# Patient Record
Sex: Female | Born: 1980 | Race: Black or African American | Hispanic: No | Smoking: Never smoker
Health system: Southern US, Community
[De-identification: ages and names within clinical notes are randomized; demographics above are authoritative.]

## PROBLEM LIST (undated history)

## (undated) DIAGNOSIS — F329 Major depressive disorder, single episode, unspecified: Secondary | ICD-10-CM

## (undated) DIAGNOSIS — F419 Anxiety disorder, unspecified: Secondary | ICD-10-CM

## (undated) DIAGNOSIS — O009 Unspecified ectopic pregnancy without intrauterine pregnancy: Secondary | ICD-10-CM

## (undated) DIAGNOSIS — I10 Essential (primary) hypertension: Secondary | ICD-10-CM

## (undated) DIAGNOSIS — I471 Supraventricular tachycardia, unspecified: Secondary | ICD-10-CM

## (undated) DIAGNOSIS — E119 Type 2 diabetes mellitus without complications: Secondary | ICD-10-CM

## (undated) DIAGNOSIS — I456 Pre-excitation syndrome: Secondary | ICD-10-CM

## (undated) DIAGNOSIS — F32A Depression, unspecified: Secondary | ICD-10-CM

## (undated) DIAGNOSIS — K589 Irritable bowel syndrome without diarrhea: Secondary | ICD-10-CM

## (undated) HISTORY — PX: ABDOMINAL SURGERY: SHX537

## (undated) HISTORY — PX: ENDOMETRIAL ABLATION: SHX621

## (undated) HISTORY — PX: CARDIAC ELECTROPHYSIOLOGY MAPPING AND ABLATION: SHX1292

## (undated) HISTORY — PX: OTHER SURGICAL HISTORY: SHX169

## (undated) HISTORY — PX: CHOLECYSTECTOMY: SHX55

---

## 2012-10-04 ENCOUNTER — Emergency Department: Payer: Self-pay | Admitting: Emergency Medicine

## 2012-10-04 LAB — HCG, QUANTITATIVE, PREGNANCY: Beta Hcg, Quant.: <1 m[IU]/mL — ABNORMAL LOW

## 2012-10-04 LAB — URINALYSIS, COMPLETE
Glucose,UR: NEGATIVE mg/dL (ref 0–75)
Ketone: NEGATIVE
Leukocyte Esterase: NEGATIVE
Nitrite: POSITIVE
Ph: 6 (ref 4.5–8.0)
Protein: NEGATIVE
RBC,UR: 1 /HPF (ref 0–5)
WBC UR: 2 /HPF (ref 0–5)

## 2012-10-04 LAB — COMPREHENSIVE METABOLIC PANEL
Albumin: 4.3 g/dL (ref 3.4–5.0)
Alkaline Phosphatase: 85 U/L (ref 50–136)
Anion Gap: 4 — ABNORMAL LOW (ref 7–16)
Calcium, Total: 8.9 mg/dL (ref 8.5–10.1)
Chloride: 103 mmol/L (ref 98–107)
Co2: 30 mmol/L (ref 21–32)
Glucose: 88 mg/dL (ref 65–99)
Osmolality: 275 (ref 275–301)
Potassium: 3.7 mmol/L (ref 3.5–5.1)
SGPT (ALT): 34 U/L (ref 12–78)
Sodium: 137 mmol/L (ref 136–145)
Total Protein: 8.1 g/dL (ref 6.4–8.2)

## 2012-10-04 LAB — CBC
HCT: 42.6 % (ref 35.0–47.0)
MCH: 31.5 pg (ref 26.0–34.0)
MCV: 92 fL (ref 80–100)
Platelet: 206 10*3/uL (ref 150–440)
RBC: 4.64 10*6/uL (ref 3.80–5.20)

## 2012-10-05 LAB — WET PREP, GENITAL

## 2012-10-07 LAB — URINE CULTURE

## 2012-10-23 ENCOUNTER — Ambulatory Visit: Payer: Self-pay | Admitting: Family Medicine

## 2013-02-25 ENCOUNTER — Emergency Department: Payer: Self-pay | Admitting: Emergency Medicine

## 2013-02-25 LAB — CBC
HCT: 40 % (ref 35.0–47.0)
HGB: 13.6 g/dL (ref 12.0–16.0)
MCH: 30.1 pg (ref 26.0–34.0)
MCHC: 33.9 g/dL (ref 32.0–36.0)
MCV: 89 fL (ref 80–100)
Platelet: 221 10*3/uL (ref 150–440)
RBC: 4.51 10*6/uL (ref 3.80–5.20)

## 2013-02-25 LAB — TROPONIN I: Troponin-I: <0.02 ng/mL

## 2013-02-25 LAB — COMPREHENSIVE METABOLIC PANEL
Alkaline Phosphatase: 63 U/L (ref 50–136)
Calcium, Total: 9 mg/dL (ref 8.5–10.1)
Chloride: 102 mmol/L (ref 98–107)
Co2: 29 mmol/L (ref 21–32)
Creatinine: 0.96 mg/dL (ref 0.60–1.30)
EGFR (Non-African Amer.): >60
Glucose: 163 mg/dL — ABNORMAL HIGH (ref 65–99)
Potassium: 3.8 mmol/L (ref 3.5–5.1)
SGOT(AST): 30 U/L (ref 15–37)
SGPT (ALT): 39 U/L (ref 12–78)
Total Protein: 7.4 g/dL (ref 6.4–8.2)

## 2013-02-25 LAB — CK TOTAL AND CKMB (NOT AT ARMC): CK-MB: 1.3 ng/mL (ref 0.5–3.6)

## 2013-02-26 LAB — CK TOTAL AND CKMB (NOT AT ARMC): CK-MB: 1.1 ng/mL (ref 0.5–3.6)

## 2013-02-26 LAB — TROPONIN I: Troponin-I: <0.02 ng/mL

## 2013-06-01 ENCOUNTER — Emergency Department: Payer: Self-pay | Admitting: Emergency Medicine

## 2013-06-01 LAB — CBC
MCH: 30.2 pg (ref 26.0–34.0)
MCHC: 34.5 g/dL (ref 32.0–36.0)
MCV: 88 fL (ref 80–100)
Platelet: 191 10*3/uL (ref 150–440)
RDW: 12.8 % (ref 11.5–14.5)
WBC: 6.6 10*3/uL (ref 3.6–11.0)

## 2013-06-01 LAB — URINALYSIS, COMPLETE
Glucose,UR: >=500 mg/dL (ref 0–75)
Leukocyte Esterase: NEGATIVE
Ph: 5 (ref 4.5–8.0)
RBC,UR: 1 /HPF (ref 0–5)
WBC UR: 1 /HPF (ref 0–5)

## 2013-06-01 LAB — COMPREHENSIVE METABOLIC PANEL
Alkaline Phosphatase: 86 U/L (ref 50–136)
BUN: 13 mg/dL (ref 7–18)
Chloride: 101 mmol/L (ref 98–107)
Co2: 25 mmol/L (ref 21–32)
Creatinine: 0.73 mg/dL (ref 0.60–1.30)
EGFR (African American): >60
EGFR (Non-African Amer.): >60
Osmolality: 282 (ref 275–301)
SGOT(AST): 24 U/L (ref 15–37)

## 2013-06-01 LAB — BETA-HYDROXYBUTYRIC ACID: Beta-Hydroxybutyrate: 11.3 mg/dL — ABNORMAL HIGH (ref 0.2–2.8)

## 2013-06-02 ENCOUNTER — Emergency Department: Payer: Self-pay | Admitting: Emergency Medicine

## 2013-06-02 LAB — BASIC METABOLIC PANEL
Co2: 24 mmol/L (ref 21–32)
EGFR (African American): >60
EGFR (Non-African Amer.): >60
Glucose: 350 mg/dL — ABNORMAL HIGH (ref 65–99)
Potassium: 3.9 mmol/L (ref 3.5–5.1)
Sodium: 131 mmol/L — ABNORMAL LOW (ref 136–145)

## 2013-06-02 LAB — CBC
HGB: 15.1 g/dL (ref 12.0–16.0)
MCH: 30.5 pg (ref 26.0–34.0)
MCHC: 34.7 g/dL (ref 32.0–36.0)
RBC: 4.95 10*6/uL (ref 3.80–5.20)
RDW: 13 % (ref 11.5–14.5)
WBC: 9.3 10*3/uL (ref 3.6–11.0)

## 2013-06-03 LAB — TROPONIN I: Troponin-I: <0.02 ng/mL

## 2013-07-26 ENCOUNTER — Emergency Department: Payer: Self-pay | Admitting: Emergency Medicine

## 2013-07-26 LAB — URINALYSIS, COMPLETE
Bacteria: NONE SEEN
Bilirubin,UR: NEGATIVE
Glucose,UR: NEGATIVE mg/dL (ref 0–75)
Ketone: NEGATIVE
Leukocyte Esterase: NEGATIVE
Ph: 8 (ref 4.5–8.0)
RBC,UR: 31 /HPF (ref 0–5)
Specific Gravity: 1.006 (ref 1.003–1.030)
Squamous Epithelial: 2
WBC UR: 2 /HPF (ref 0–5)

## 2013-07-26 LAB — COMPREHENSIVE METABOLIC PANEL
Albumin: 3.7 g/dL (ref 3.4–5.0)
Alkaline Phosphatase: 80 U/L (ref 50–136)
Anion Gap: 3 — ABNORMAL LOW (ref 7–16)
BUN: 11 mg/dL (ref 7–18)
Bilirubin,Total: 0.5 mg/dL (ref 0.2–1.0)
Calcium, Total: 8.9 mg/dL (ref 8.5–10.1)
Chloride: 104 mmol/L (ref 98–107)
Co2: 28 mmol/L (ref 21–32)
Creatinine: 0.87 mg/dL (ref 0.60–1.30)
EGFR (African American): >60
EGFR (Non-African Amer.): >60
Glucose: 140 mg/dL — ABNORMAL HIGH (ref 65–99)
Potassium: 3.9 mmol/L (ref 3.5–5.1)
SGOT(AST): 20 U/L (ref 15–37)
SGPT (ALT): 23 U/L (ref 12–78)
Total Protein: 7.2 g/dL (ref 6.4–8.2)

## 2013-07-26 LAB — CBC
HGB: 12.5 g/dL (ref 12.0–16.0)
MCH: 30.3 pg (ref 26.0–34.0)
MCV: 88 fL (ref 80–100)
Platelet: 263 10*3/uL (ref 150–440)
RBC: 4.13 10*6/uL (ref 3.80–5.20)
RDW: 12.3 % (ref 11.5–14.5)

## 2013-08-13 ENCOUNTER — Emergency Department: Payer: Self-pay | Admitting: Emergency Medicine

## 2013-08-15 LAB — BETA STREP CULTURE(ARMC)

## 2013-10-09 ENCOUNTER — Emergency Department: Payer: Self-pay | Admitting: Emergency Medicine

## 2013-10-09 LAB — BASIC METABOLIC PANEL
Anion Gap: 5 — ABNORMAL LOW (ref 7–16)
BUN: 14 mg/dL (ref 7–18)
Calcium, Total: 9.4 mg/dL (ref 8.5–10.1)
Chloride: 102 mmol/L (ref 98–107)
Co2: 30 mmol/L (ref 21–32)
EGFR (African American): >60

## 2013-10-09 LAB — CBC
HCT: 39.2 % (ref 35.0–47.0)
HGB: 12.9 g/dL (ref 12.0–16.0)
MCH: 28.8 pg (ref 26.0–34.0)
MCHC: 33.1 g/dL (ref 32.0–36.0)
MCV: 87 fL (ref 80–100)
RDW: 14.3 % (ref 11.5–14.5)

## 2013-10-09 LAB — PRO B NATRIURETIC PEPTIDE: B-Type Natriuretic Peptide: 37 pg/mL (ref 0–125)

## 2013-11-07 ENCOUNTER — Emergency Department: Payer: Self-pay | Admitting: Emergency Medicine

## 2013-11-07 LAB — COMPREHENSIVE METABOLIC PANEL
AST: 26 U/L (ref 15–37)
Albumin: 4.3 g/dL (ref 3.4–5.0)
Alkaline Phosphatase: 85 U/L
Anion Gap: 3 — ABNORMAL LOW (ref 7–16)
BILIRUBIN TOTAL: 0.5 mg/dL (ref 0.2–1.0)
BUN: 12 mg/dL (ref 7–18)
CHLORIDE: 97 mmol/L — AB (ref 98–107)
Calcium, Total: 9.9 mg/dL (ref 8.5–10.1)
Co2: 31 mmol/L (ref 21–32)
Creatinine: 1.04 mg/dL (ref 0.60–1.30)
EGFR (African American): >60
EGFR (Non-African Amer.): >60
GLUCOSE: 311 mg/dL — AB (ref 65–99)
Osmolality: 274 (ref 275–301)
POTASSIUM: 3.8 mmol/L (ref 3.5–5.1)
SGPT (ALT): 31 U/L (ref 12–78)
Sodium: 131 mmol/L — ABNORMAL LOW (ref 136–145)
TOTAL PROTEIN: 8.5 g/dL — AB (ref 6.4–8.2)

## 2013-11-07 LAB — URINALYSIS, COMPLETE
Bacteria: NONE SEEN
Bilirubin,UR: NEGATIVE
Blood: NEGATIVE
KETONE: NEGATIVE
LEUKOCYTE ESTERASE: NEGATIVE
Nitrite: NEGATIVE
PH: 6 (ref 4.5–8.0)
Protein: NEGATIVE
RBC,UR: 1 /HPF (ref 0–5)
Specific Gravity: 1.031 (ref 1.003–1.030)

## 2013-11-07 LAB — CBC
HCT: 40.9 % (ref 35.0–47.0)
HGB: 13.7 g/dL (ref 12.0–16.0)
MCH: 28.8 pg (ref 26.0–34.0)
MCHC: 33.4 g/dL (ref 32.0–36.0)
MCV: 86 fL (ref 80–100)
PLATELETS: 231 10*3/uL (ref 150–440)
RBC: 4.75 10*6/uL (ref 3.80–5.20)
RDW: 14.1 % (ref 11.5–14.5)
WBC: 6.7 10*3/uL (ref 3.6–11.0)

## 2013-11-19 ENCOUNTER — Emergency Department: Payer: Self-pay | Admitting: Emergency Medicine

## 2014-05-27 ENCOUNTER — Emergency Department: Payer: Self-pay | Admitting: Emergency Medicine

## 2014-05-27 LAB — COMPREHENSIVE METABOLIC PANEL
ANION GAP: 8 (ref 7–16)
AST: 30 U/L (ref 15–37)
Albumin: 4 g/dL (ref 3.4–5.0)
Alkaline Phosphatase: 56 U/L
BUN: 13 mg/dL (ref 7–18)
Bilirubin,Total: 0.5 mg/dL (ref 0.2–1.0)
Calcium, Total: 9.2 mg/dL (ref 8.5–10.1)
Chloride: 101 mmol/L (ref 98–107)
Co2: 27 mmol/L (ref 21–32)
Creatinine: 0.84 mg/dL (ref 0.60–1.30)
GLUCOSE: 127 mg/dL — AB (ref 65–99)
OSMOLALITY: 274 (ref 275–301)
Potassium: 3.4 mmol/L — ABNORMAL LOW (ref 3.5–5.1)
SGPT (ALT): 28 U/L
Sodium: 136 mmol/L (ref 136–145)
TOTAL PROTEIN: 7.7 g/dL (ref 6.4–8.2)

## 2014-05-27 LAB — URINALYSIS, COMPLETE
Bacteria: NONE SEEN
Bilirubin,UR: NEGATIVE
Blood: NEGATIVE
Glucose,UR: NEGATIVE mg/dL (ref 0–75)
KETONE: NEGATIVE
Leukocyte Esterase: NEGATIVE
Nitrite: NEGATIVE
Ph: 5 (ref 4.5–8.0)
Protein: NEGATIVE
SPECIFIC GRAVITY: 1.011 (ref 1.003–1.030)
WBC UR: 2 /HPF (ref 0–5)

## 2014-05-27 LAB — CBC
HCT: 38.3 % (ref 35.0–47.0)
HGB: 12.6 g/dL (ref 12.0–16.0)
MCH: 29 pg (ref 26.0–34.0)
MCHC: 32.8 g/dL (ref 32.0–36.0)
MCV: 88 fL (ref 80–100)
Platelet: 246 10*3/uL (ref 150–440)
RBC: 4.34 10*6/uL (ref 3.80–5.20)
RDW: 13.8 % (ref 11.5–14.5)
WBC: 8.1 10*3/uL (ref 3.6–11.0)

## 2014-07-25 ENCOUNTER — Emergency Department: Payer: Self-pay | Admitting: Emergency Medicine

## 2014-07-25 LAB — CBC WITH DIFFERENTIAL/PLATELET
Basophil #: 0.1 10*3/uL (ref 0.0–0.1)
Basophil %: 0.5 %
Eosinophil #: 0.1 10*3/uL (ref 0.0–0.7)
Eosinophil %: 1.2 %
HCT: 41.5 % (ref 35.0–47.0)
HGB: 13.4 g/dL (ref 12.0–16.0)
LYMPHS PCT: 13.8 %
Lymphocyte #: 1.5 10*3/uL (ref 1.0–3.6)
MCH: 28.9 pg (ref 26.0–34.0)
MCHC: 32.4 g/dL (ref 32.0–36.0)
MCV: 89 fL (ref 80–100)
MONO ABS: 0.8 x10 3/mm (ref 0.2–0.9)
MONOS PCT: 7 %
NEUTROS PCT: 77.5 %
Neutrophil #: 8.6 10*3/uL — ABNORMAL HIGH (ref 1.4–6.5)
Platelet: 242 10*3/uL (ref 150–440)
RBC: 4.65 10*6/uL (ref 3.80–5.20)
RDW: 14.8 % — ABNORMAL HIGH (ref 11.5–14.5)
WBC: 11.1 10*3/uL — ABNORMAL HIGH (ref 3.6–11.0)

## 2014-07-25 LAB — WET PREP, GENITAL

## 2014-07-25 LAB — URINALYSIS, COMPLETE
BACTERIA: NONE SEEN
Bilirubin,UR: NEGATIVE
Nitrite: NEGATIVE
Ph: 6 (ref 4.5–8.0)
SPECIFIC GRAVITY: 1.031 (ref 1.003–1.030)
Squamous Epithelial: 2
WBC UR: 261 /HPF (ref 0–5)

## 2014-07-25 LAB — TROPONIN I

## 2014-07-25 LAB — COMPREHENSIVE METABOLIC PANEL
ALBUMIN: 3.9 g/dL (ref 3.4–5.0)
ALK PHOS: 68 U/L
AST: 25 U/L (ref 15–37)
Anion Gap: 5 — ABNORMAL LOW (ref 7–16)
BILIRUBIN TOTAL: 1 mg/dL (ref 0.2–1.0)
BUN: 18 mg/dL (ref 7–18)
CHLORIDE: 100 mmol/L (ref 98–107)
Calcium, Total: 8.5 mg/dL (ref 8.5–10.1)
Co2: 28 mmol/L (ref 21–32)
Creatinine: 1.21 mg/dL (ref 0.60–1.30)
EGFR (African American): >60
EGFR (Non-African Amer.): 55 — ABNORMAL LOW
Glucose: 360 mg/dL — ABNORMAL HIGH (ref 65–99)
Osmolality: 283 (ref 275–301)
Potassium: 3.8 mmol/L (ref 3.5–5.1)
SGPT (ALT): 24 U/L
Sodium: 133 mmol/L — ABNORMAL LOW (ref 136–145)
TOTAL PROTEIN: 7.5 g/dL (ref 6.4–8.2)

## 2014-07-25 LAB — GC/CHLAMYDIA PROBE AMP

## 2014-07-28 LAB — URINE CULTURE

## 2014-08-07 ENCOUNTER — Emergency Department: Payer: Self-pay | Admitting: Emergency Medicine

## 2014-08-07 LAB — COMPREHENSIVE METABOLIC PANEL
ALBUMIN: 3.5 g/dL (ref 3.4–5.0)
ANION GAP: 11 (ref 7–16)
Alkaline Phosphatase: 76 U/L
BILIRUBIN TOTAL: 0.5 mg/dL (ref 0.2–1.0)
BUN: 11 mg/dL (ref 7–18)
CALCIUM: 8.9 mg/dL (ref 8.5–10.1)
CO2: 23 mmol/L (ref 21–32)
Chloride: 104 mmol/L (ref 98–107)
Creatinine: 0.87 mg/dL (ref 0.60–1.30)
EGFR (African American): >60
EGFR (Non-African Amer.): >60
Glucose: 190 mg/dL — ABNORMAL HIGH (ref 65–99)
Osmolality: 280 (ref 275–301)
Potassium: 3.6 mmol/L (ref 3.5–5.1)
SGOT(AST): 21 U/L (ref 15–37)
SGPT (ALT): 21 U/L
Sodium: 138 mmol/L (ref 136–145)
TOTAL PROTEIN: 7.4 g/dL (ref 6.4–8.2)

## 2014-08-07 LAB — CBC
HCT: 39.4 % (ref 35.0–47.0)
HGB: 12.7 g/dL (ref 12.0–16.0)
MCH: 28.3 pg (ref 26.0–34.0)
MCHC: 32.2 g/dL (ref 32.0–36.0)
MCV: 88 fL (ref 80–100)
Platelet: 239 10*3/uL (ref 150–440)
RBC: 4.48 10*6/uL (ref 3.80–5.20)
RDW: 14.4 % (ref 11.5–14.5)
WBC: 6.4 10*3/uL (ref 3.6–11.0)

## 2014-08-07 LAB — CK TOTAL AND CKMB (NOT AT ARMC)
CK, TOTAL: 101 U/L
CK-MB: 0.5 ng/mL (ref 0.5–3.6)

## 2014-08-07 LAB — TROPONIN I: Troponin-I: <0.02 ng/mL

## 2014-09-04 ENCOUNTER — Emergency Department: Payer: Self-pay | Admitting: Emergency Medicine

## 2014-09-22 ENCOUNTER — Emergency Department: Payer: Self-pay | Admitting: Emergency Medicine

## 2014-09-22 LAB — COMPREHENSIVE METABOLIC PANEL
AST: 20 U/L (ref 15–37)
Albumin: 3.6 g/dL (ref 3.4–5.0)
Alkaline Phosphatase: 61 U/L
Anion Gap: 7 (ref 7–16)
BILIRUBIN TOTAL: 0.4 mg/dL (ref 0.2–1.0)
BUN: 19 mg/dL — ABNORMAL HIGH (ref 7–18)
CALCIUM: 8.8 mg/dL (ref 8.5–10.1)
CHLORIDE: 105 mmol/L (ref 98–107)
CREATININE: 0.81 mg/dL (ref 0.60–1.30)
Co2: 27 mmol/L (ref 21–32)
EGFR (African American): >60
EGFR (Non-African Amer.): >60
Glucose: 90 mg/dL (ref 65–99)
Osmolality: 279 (ref 275–301)
Potassium: 4.2 mmol/L (ref 3.5–5.1)
SGPT (ALT): 20 U/L
Sodium: 139 mmol/L (ref 136–145)
TOTAL PROTEIN: 6.6 g/dL (ref 6.4–8.2)

## 2014-09-22 LAB — CBC
HCT: 39.1 % (ref 35.0–47.0)
HGB: 12.8 g/dL (ref 12.0–16.0)
MCH: 30 pg (ref 26.0–34.0)
MCHC: 32.8 g/dL (ref 32.0–36.0)
MCV: 92 fL (ref 80–100)
Platelet: 219 10*3/uL (ref 150–440)
RBC: 4.27 10*6/uL (ref 3.80–5.20)
RDW: 14.5 % (ref 11.5–14.5)
WBC: 7.3 10*3/uL (ref 3.6–11.0)

## 2014-09-22 LAB — TROPONIN I: Troponin-I: <0.02 ng/mL

## 2014-10-02 ENCOUNTER — Emergency Department: Payer: Self-pay | Admitting: Emergency Medicine

## 2014-10-02 LAB — URINALYSIS, COMPLETE
Bacteria: NONE SEEN
Bilirubin,UR: NEGATIVE
Blood: NEGATIVE
Glucose,UR: 50 mg/dL (ref 0–75)
Ketone: NEGATIVE
Leukocyte Esterase: NEGATIVE
Nitrite: NEGATIVE
Ph: 6 (ref 4.5–8.0)
Protein: NEGATIVE
RBC,UR: 2 /HPF (ref 0–5)
Specific Gravity: 1.017 (ref 1.003–1.030)
Squamous Epithelial: 8
WBC UR: 1 /HPF (ref 0–5)

## 2014-10-02 LAB — CBC
HCT: 40 % (ref 35.0–47.0)
HGB: 13.2 g/dL (ref 12.0–16.0)
MCH: 30.1 pg (ref 26.0–34.0)
MCHC: 33.1 g/dL (ref 32.0–36.0)
MCV: 91 fL (ref 80–100)
Platelet: 238 10*3/uL (ref 150–440)
RBC: 4.39 10*6/uL (ref 3.80–5.20)
RDW: 14.5 % (ref 11.5–14.5)
WBC: 8.9 10*3/uL (ref 3.6–11.0)

## 2014-10-02 LAB — COMPREHENSIVE METABOLIC PANEL
AST: 24 U/L (ref 15–37)
Albumin: 4.1 g/dL (ref 3.4–5.0)
Alkaline Phosphatase: 66 U/L
Anion Gap: 8 (ref 7–16)
BUN: 22 mg/dL — AB (ref 7–18)
Bilirubin,Total: 0.4 mg/dL (ref 0.2–1.0)
Calcium, Total: 9.1 mg/dL (ref 8.5–10.1)
Chloride: 103 mmol/L (ref 98–107)
Co2: 29 mmol/L (ref 21–32)
Creatinine: 0.84 mg/dL (ref 0.60–1.30)
EGFR (African American): >60
EGFR (Non-African Amer.): >60
GLUCOSE: 65 mg/dL (ref 65–99)
Osmolality: 281 (ref 275–301)
Potassium: 3.5 mmol/L (ref 3.5–5.1)
SGPT (ALT): 24 U/L
Sodium: 140 mmol/L (ref 136–145)
TOTAL PROTEIN: 7.6 g/dL (ref 6.4–8.2)

## 2014-10-02 LAB — TROPONIN I: Troponin-I: <0.02 ng/mL

## 2014-10-30 ENCOUNTER — Emergency Department: Payer: Self-pay | Admitting: Emergency Medicine

## 2014-10-30 LAB — URINALYSIS, COMPLETE
BACTERIA: NONE SEEN
BLOOD: NEGATIVE
Bilirubin,UR: NEGATIVE
Ketone: NEGATIVE
Leukocyte Esterase: NEGATIVE
Nitrite: NEGATIVE
PH: 5 (ref 4.5–8.0)
Protein: NEGATIVE
RBC,UR: 1 /HPF (ref 0–5)
Specific Gravity: 1.021 (ref 1.003–1.030)
Squamous Epithelial: 1
WBC UR: 2 /HPF (ref 0–5)

## 2014-10-30 LAB — CBC
HCT: 39.8 % (ref 35.0–47.0)
HGB: 13 g/dL (ref 12.0–16.0)
MCH: 30.2 pg (ref 26.0–34.0)
MCHC: 32.6 g/dL (ref 32.0–36.0)
MCV: 93 fL (ref 80–100)
PLATELETS: 231 10*3/uL (ref 150–440)
RBC: 4.3 10*6/uL (ref 3.80–5.20)
RDW: 14.3 % (ref 11.5–14.5)
WBC: 8.4 10*3/uL (ref 3.6–11.0)

## 2014-10-30 LAB — COMPREHENSIVE METABOLIC PANEL
ALK PHOS: 61 U/L
ALT: 24 U/L
AST: 19 U/L (ref 15–37)
Albumin: 3.9 g/dL (ref 3.4–5.0)
Anion Gap: 7 (ref 7–16)
BUN: 14 mg/dL (ref 7–18)
Bilirubin,Total: 0.4 mg/dL (ref 0.2–1.0)
CO2: 25 mmol/L (ref 21–32)
Calcium, Total: 8.7 mg/dL (ref 8.5–10.1)
Chloride: 104 mmol/L (ref 98–107)
Creatinine: 0.9 mg/dL (ref 0.60–1.30)
EGFR (Non-African Amer.): >60
Glucose: 183 mg/dL — ABNORMAL HIGH (ref 65–99)
Osmolality: 277 (ref 275–301)
Potassium: 3.8 mmol/L (ref 3.5–5.1)
Sodium: 136 mmol/L (ref 136–145)
TOTAL PROTEIN: 7.5 g/dL (ref 6.4–8.2)

## 2014-10-30 LAB — WET PREP, GENITAL

## 2014-10-30 LAB — GC/CHLAMYDIA PROBE AMP

## 2014-10-30 LAB — PREGNANCY, URINE: Pregnancy Test, Urine: NEGATIVE m[IU]/mL

## 2014-11-16 ENCOUNTER — Emergency Department: Payer: Self-pay | Admitting: Emergency Medicine

## 2014-11-16 LAB — COMPREHENSIVE METABOLIC PANEL
ALK PHOS: 63 U/L
ALT: 24 U/L
AST: 30 U/L (ref 15–37)
Albumin: 3.9 g/dL (ref 3.4–5.0)
Anion Gap: 9 (ref 7–16)
BILIRUBIN TOTAL: 0.7 mg/dL (ref 0.2–1.0)
BUN: 13 mg/dL (ref 7–18)
CHLORIDE: 101 mmol/L (ref 98–107)
CREATININE: 1.04 mg/dL (ref 0.60–1.30)
Calcium, Total: 9 mg/dL (ref 8.5–10.1)
Co2: 27 mmol/L (ref 21–32)
Glucose: 339 mg/dL — ABNORMAL HIGH (ref 65–99)
Osmolality: 287 (ref 275–301)
Potassium: 3.6 mmol/L (ref 3.5–5.1)
Sodium: 137 mmol/L (ref 136–145)
Total Protein: 7.4 g/dL (ref 6.4–8.2)

## 2014-11-16 LAB — CBC WITH DIFFERENTIAL/PLATELET
Basophil #: 0.1 10*3/uL (ref 0.0–0.1)
Basophil %: 0.7 %
EOS ABS: 0.1 10*3/uL (ref 0.0–0.7)
Eosinophil %: 1.2 %
HCT: 39.1 % (ref 35.0–47.0)
HGB: 12.6 g/dL (ref 12.0–16.0)
Lymphocyte #: 2.5 10*3/uL (ref 1.0–3.6)
Lymphocyte %: 27.9 %
MCH: 29.2 pg (ref 26.0–34.0)
MCHC: 32.3 g/dL (ref 32.0–36.0)
MCV: 90 fL (ref 80–100)
MONO ABS: 0.7 x10 3/mm (ref 0.2–0.9)
Monocyte %: 7.4 %
NEUTROS PCT: 62.8 %
Neutrophil #: 5.5 10*3/uL (ref 1.4–6.5)
PLATELETS: 233 10*3/uL (ref 150–440)
RBC: 4.33 10*6/uL (ref 3.80–5.20)
RDW: 13.8 % (ref 11.5–14.5)
WBC: 8.8 10*3/uL (ref 3.6–11.0)

## 2014-11-16 LAB — LIPASE, BLOOD: LIPASE: 101 U/L (ref 73–393)

## 2014-11-17 LAB — URINALYSIS, COMPLETE
BACTERIA: NONE SEEN
BLOOD: NEGATIVE
Bilirubin,UR: NEGATIVE
LEUKOCYTE ESTERASE: NEGATIVE
NITRITE: NEGATIVE
Ph: 7 (ref 4.5–8.0)
Protein: NEGATIVE
RBC,UR: <1 /HPF (ref 0–5)
SPECIFIC GRAVITY: 1.018 (ref 1.003–1.030)
Squamous Epithelial: 1

## 2014-11-25 ENCOUNTER — Ambulatory Visit: Payer: Self-pay | Admitting: Physician Assistant

## 2015-01-20 ENCOUNTER — Ambulatory Visit: Payer: Self-pay | Admitting: Family Medicine

## 2015-01-22 LAB — CBC WITH DIFFERENTIAL/PLATELET
Basophil #: 0 10*3/uL (ref 0.0–0.1)
Basophil %: 0.5 %
Eosinophil #: 0.1 10*3/uL (ref 0.0–0.7)
Eosinophil %: 1.1 %
HCT: 38.5 % (ref 35.0–47.0)
HGB: 12.7 g/dL (ref 12.0–16.0)
Lymphocyte #: 1.7 10*3/uL (ref 1.0–3.6)
Lymphocyte %: 21.5 %
MCH: 29.4 pg (ref 26.0–34.0)
MCHC: 32.9 g/dL (ref 32.0–36.0)
MCV: 89 fL (ref 80–100)
Monocyte #: 0.7 x10 3/mm (ref 0.2–0.9)
Monocyte %: 8.7 %
NEUTROS ABS: 5.4 10*3/uL (ref 1.4–6.5)
NEUTROS PCT: 68.2 %
PLATELETS: 246 10*3/uL (ref 150–440)
RBC: 4.31 10*6/uL (ref 3.80–5.20)
RDW: 14.7 % — ABNORMAL HIGH (ref 11.5–14.5)
WBC: 8 10*3/uL (ref 3.6–11.0)

## 2015-01-22 LAB — COMPREHENSIVE METABOLIC PANEL
Albumin: 4.2 g/dL
Alkaline Phosphatase: 55 U/L
Anion Gap: 6 — ABNORMAL LOW (ref 7–16)
BILIRUBIN TOTAL: 0.7 mg/dL
BUN: 15 mg/dL
CO2: 28 mmol/L
Calcium, Total: 9.1 mg/dL
Chloride: 97 mmol/L — ABNORMAL LOW
Creatinine: 0.75 mg/dL
EGFR (Non-African Amer.): >60
GLUCOSE: 226 mg/dL — AB
Potassium: 3.8 mmol/L
SGOT(AST): 20 U/L
SGPT (ALT): 16 U/L
Sodium: 131 mmol/L — ABNORMAL LOW
TOTAL PROTEIN: 7.4 g/dL

## 2015-03-13 ENCOUNTER — Other Ambulatory Visit: Payer: Self-pay

## 2015-03-13 ENCOUNTER — Encounter: Payer: Self-pay | Admitting: Emergency Medicine

## 2015-03-13 ENCOUNTER — Emergency Department
Admission: EM | Admit: 2015-03-13 | Discharge: 2015-03-13 | Disposition: A | Discharge status: Home / Self Care | Payer: Commercial Managed Care - HMO | Attending: Emergency Medicine | Admitting: Emergency Medicine

## 2015-03-13 DIAGNOSIS — I10 Essential (primary) hypertension: Secondary | ICD-10-CM | POA: Diagnosis not present

## 2015-03-13 DIAGNOSIS — R51 Headache: Secondary | ICD-10-CM | POA: Diagnosis not present

## 2015-03-13 DIAGNOSIS — R079 Chest pain, unspecified: Secondary | ICD-10-CM | POA: Diagnosis present

## 2015-03-13 DIAGNOSIS — M940 Chondrocostal junction syndrome [Tietze]: Secondary | ICD-10-CM | POA: Diagnosis not present

## 2015-03-13 DIAGNOSIS — Z880 Allergy status to penicillin: Secondary | ICD-10-CM | POA: Insufficient documentation

## 2015-03-13 DIAGNOSIS — E119 Type 2 diabetes mellitus without complications: Secondary | ICD-10-CM | POA: Insufficient documentation

## 2015-03-13 HISTORY — DX: Essential (primary) hypertension: I10

## 2015-03-13 HISTORY — DX: Type 2 diabetes mellitus without complications: E11.9

## 2015-03-13 LAB — COMPREHENSIVE METABOLIC PANEL
ALT: 20 U/L (ref 14–54)
ANION GAP: 9 (ref 5–15)
AST: 25 U/L (ref 15–41)
Albumin: 4.4 g/dL (ref 3.5–5.0)
Alkaline Phosphatase: 54 U/L (ref 38–126)
BILIRUBIN TOTAL: 0.7 mg/dL (ref 0.3–1.2)
BUN: 20 mg/dL (ref 6–20)
CHLORIDE: 102 mmol/L (ref 101–111)
CO2: 28 mmol/L (ref 22–32)
Calcium: 9.8 mg/dL (ref 8.9–10.3)
Creatinine, Ser: 0.85 mg/dL (ref 0.44–1.00)
GFR calc Af Amer: >60 mL/min (ref >60)
GFR calc non Af Amer: >60 mL/min (ref >60)
Glucose, Bld: 173 mg/dL — ABNORMAL HIGH (ref 65–99)
POTASSIUM: 3.5 mmol/L (ref 3.5–5.1)
Sodium: 139 mmol/L (ref 135–145)
Total Protein: 7.5 g/dL (ref 6.5–8.1)

## 2015-03-13 LAB — CBC
HEMATOCRIT: 39 % (ref 35.0–47.0)
Hemoglobin: 12.4 g/dL (ref 12.0–16.0)
MCH: 28.6 pg (ref 26.0–34.0)
MCHC: 31.9 g/dL — ABNORMAL LOW (ref 32.0–36.0)
MCV: 89.7 fL (ref 80.0–100.0)
Platelets: 241 10*3/uL (ref 150–440)
RBC: 4.35 MIL/uL (ref 3.80–5.20)
RDW: 14.8 % — AB (ref 11.5–14.5)
WBC: 7.7 10*3/uL (ref 3.6–11.0)

## 2015-03-13 LAB — TROPONIN I

## 2015-03-13 LAB — GLUCOSE, CAPILLARY: GLUCOSE-CAPILLARY: 170 mg/dL — AB (ref 65–99)

## 2015-03-13 LAB — FIBRIN DERIVATIVES D-DIMER (ARMC ONLY): Fibrin derivatives D-dimer (ARMC): 169 (ref 0–499)

## 2015-03-13 MED ORDER — OXYCODONE-ACETAMINOPHEN 5-325 MG PO TABS: 1.0000 | ORAL_TABLET | Freq: Once | ORAL | Status: DC

## 2015-03-13 MED ORDER — KETOROLAC TROMETHAMINE 60 MG/2ML IM SOLN: 60.0000 mg | Freq: Once | INTRAMUSCULAR | Status: DC

## 2015-03-13 MED ORDER — OXYCODONE-ACETAMINOPHEN 5-325 MG PO TABS
ORAL_TABLET | ORAL | Status: AC
  Filled 2015-03-13: qty 1

## 2015-03-13 MED ORDER — KETOROLAC TROMETHAMINE 30 MG/ML IJ SOLN
INTRAMUSCULAR | Status: AC
  Administered 2015-03-13: 30 mg via INTRAVENOUS
  Filled 2015-03-13: qty 1

## 2015-03-13 MED ORDER — KETOROLAC TROMETHAMINE 30 MG/ML IJ SOLN
30.0000 mg | Freq: Once | INTRAMUSCULAR | Status: AC
  Administered 2015-03-13: 30 mg via INTRAVENOUS

## 2015-03-13 NOTE — Discharge Instructions (Signed)
Costochondritis Costochondritis, sometimes called Tietze syndrome, is a swelling and irritation (inflammation) of the tissue (cartilage) that connects your ribs with your breastbone (sternum). It causes pain in the chest and rib area. Costochondritis usually goes away on its own over time. It can take up to 6 weeks or longer to get better, especially if you are unable to limit your activities. CAUSES  Some cases of costochondritis have no known cause. Possible causes include:  Injury (trauma).  Exercise or activity such as lifting.  Severe coughing. SIGNS AND SYMPTOMS  Pain and tenderness in the chest and rib area.  Pain that gets worse when coughing or taking deep breaths.  Pain that gets worse with specific movements. DIAGNOSIS  Your health care provider will do a physical exam and ask about your symptoms. Chest X-rays or other tests may be done to rule out other problems. TREATMENT  Costochondritis usually goes away on its own over time. Your health care provider may prescribe medicine to help relieve pain. HOME CARE INSTRUCTIONS   Avoid exhausting physical activity. Try not to strain your ribs during normal activity. This would include any activities using chest, abdominal, and side muscles, especially if heavy weights are used.  Apply ice to the affected area for the first 2 days after the pain begins.  Put ice in a plastic bag.  Place a towel between your skin and the bag.  Leave the ice on for 20 minutes, 2-3 times a day.  Only take over-the-counter or prescription medicines as directed by your health care provider. SEEK MEDICAL CARE IF:  You have redness or swelling at the rib joints. These are signs of infection.  Your pain does not go away despite rest or medicine. SEEK IMMEDIATE MEDICAL CARE IF:   Your pain increases or you are very uncomfortable.  You have shortness of breath or difficulty breathing.  You cough up blood.  You have worse chest pains,  sweating, or vomiting.  You have a fever or persistent symptoms for more than 2-3 days.  You have a fever and your symptoms suddenly get worse. MAKE SURE YOU:   Understand these instructions.  Will watch your condition.  Will get help right away if you are not doing well or get worse. Document Released: 07/27/2005 Document Revised: 08/07/2013 Document Reviewed: 05/21/2013 Exit[employer] Patient Information 2015 [employer]. This information is not intended to replace advice given to you by your health care provider. Make sure you discuss any questions you have with your health care provider.  

## 2015-03-13 NOTE — ED Notes (Signed)
Pt presents to the ER from home with complaints of Chest Pain. Pt reports she was lying down, pt reports sudden chest discomfort"feel like pressure" Pt reports headache for the past 2 days. Pt talks in complete sentences no respiratory distress noted.

## 2015-03-13 NOTE — ED Provider Notes (Signed)
[facility] Emergency Department Provider Note  ____________________________________________  Time seen: 5:50 AM  I have reviewed the triage vital signs and the nursing notes.   HISTORY  Chief Complaint Chest Pain and Headache      HPI Jody Chavez is a 34 y.o. female presents with acute onset of central chest pressure that is nonradiating reproducible with palpation with onset at 4:30 AM. Patient also admits of headache 2 days she describes the migraine. Current pain score 5 out of 10     Past Medical History  Diagnosis Date  . Hypertension   . Diabetes mellitus without complication     There are no active problems to display for this patient.   Past Surgical History  Procedure Laterality Date  . Cholecystectomy    . Heart ablation      No current outpatient prescriptions on file.  Allergies Penicillins  History reviewed. No pertinent family history.  Social History History  Substance Use Topics  . Smoking status: Never Smoker   . Smokeless tobacco: Never Used  . Alcohol Use: Yes    Review of Systems  Constitutional: Negative for fever. Eyes: Negative for visual changes. ENT: Negative for sore throat. Cardiovascular: Negative for chest pain. Respiratory: Negative for shortness of breath. Gastrointestinal: Negative for abdominal pain, vomiting and diarrhea. Genitourinary: Negative for dysuria. Musculoskeletal: Negative for back pain. Skin: Negative for rash. Neurological: Negative for headaches, focal weakness or numbness.   10-point ROS otherwise negative.  ____________________________________________   PHYSICAL EXAM:  VITAL SIGNS: ED Triage Vitals  Enc Vitals Group     BP 03/13/15 0500 120/80 mmHg     Pulse Rate 03/13/15 0500 109     Resp 03/13/15 0500 20     Temp 03/13/15 0500 98 F (36.7 C)     Temp Source 03/13/15 0500 Oral     SpO2 03/13/15 0500 100 %     Weight 03/13/15 0500 175 lb (79.379 kg)      Height 03/13/15 0500 5\' 3"  (1.6 m)     Head Cir --      Peak Flow --      Pain Score 03/13/15 0504 6     Pain Loc --      Pain Edu? --      Excl. in GC? --      Constitutional: Alert and oriented. Well appearing and in no distress. Eyes: Conjunctivae are normal. PERRL. Normal extraocular movements. ENT   Head: Normocephalic and atraumatic.   Nose: No congestion/rhinnorhea.   Mouth/Throat: Mucous membranes are moist.   Neck: No stridor. Cardiovascular: Normal rate, regular rhythm. Normal and symmetric distal pulses are present in all extremities. No murmurs, rubs, or gallops. Right parasternal chest pain with palpation Respiratory: Normal respiratory effort without tachypnea nor retractions. Breath sounds are clear and equal bilaterally. No wheezes/rales/rhonchi. Gastrointestinal: Soft and nontender. No distention. There is no CVA tenderness. Genitourinary: deferred Musculoskeletal: Nontender with normal range of motion in all extremities. No joint effusions.  No lower extremity tenderness nor edema. Neurologic:  Normal speech and language. No gross focal neurologic deficits are appreciated. Speech is normal.  Skin:  Skin is warm, dry and intact. No rash noted. Psychiatric: Mood and affect are normal. Speech and behavior are normal. Patient exhibits appropriate insight and judgment.  ____________________________________________    LABS (pertinent positives/negatives)  Labs Reviewed  CBC - Abnormal; Notable for the following:    MCHC 31.9 (*)    RDW 14.8 (*)    All other  components within normal limits  COMPREHENSIVE METABOLIC PANEL - Abnormal; Notable for the following:    Glucose, Bld 173 (*)    All other components within normal limits  GLUCOSE, CAPILLARY - Abnormal; Notable for the following:    Glucose-Capillary 170 (*)    All other components within normal limits  TROPONIN I  FIBRIN DERIVATIVES D-D[facility])      ____________________________________________   EKG   Date: 03/13/2015  Rate: 100  Rhythm: normal sinus rhythm  QRS Axis: normal  Intervals: normal  ST/T Wave abnormalities: normal  Conduction Disutrbances: none  Narrative Interpretation: unremarkable      ____________________________________________        INITIAL IMPRESSION / ASSESSMENT AND PLAN / ED COURSE  Pertinent labs & imaging results that were available during my care of the patient were reviewed by me and considered in my medical decision making (see chart for details).  History of physical exam consistent with costochondritis, reproducible chest pain with palpation of the right parasternal border.  ____________________________________________   FINAL CLINICAL IMPRESSION(S) / ED DIAGNOSES  Final diagnoses:  Costochondritis, acute      Darci Currentandolph N Smriti Barkow, MD 03/13/15 0700

## 2015-03-13 NOTE — ED Notes (Addendum)
Pt c/o right chest pain since 0430 this morning like heavy pressure feeling.  Pt reports headache for two days.  Pt denies numbness or tingling to extremities, SOB, or visual changes.  Pt denies pain to chest with activity or deep breathing but has tenderness to palpation.  Pt has hx of diabetes with insulin pump, HTN, and hx of anxiety.  Pt lungs clear x4, speaking in complete and coherent sentences, A&Ox4, and in NAD at this time.

## 2015-04-14 ENCOUNTER — Other Ambulatory Visit: Payer: Self-pay

## 2015-04-14 ENCOUNTER — Emergency Department: Payer: 59

## 2015-04-14 ENCOUNTER — Emergency Department
Admission: EM | Admit: 2015-04-14 | Discharge: 2015-04-14 | Disposition: A | Discharge status: Home / Self Care | Payer: 59 | Attending: Emergency Medicine | Admitting: Emergency Medicine

## 2015-04-14 ENCOUNTER — Encounter: Payer: Self-pay | Admitting: Emergency Medicine

## 2015-04-14 DIAGNOSIS — I10 Essential (primary) hypertension: Secondary | ICD-10-CM | POA: Diagnosis not present

## 2015-04-14 DIAGNOSIS — Z880 Allergy status to penicillin: Secondary | ICD-10-CM | POA: Insufficient documentation

## 2015-04-14 DIAGNOSIS — K5900 Constipation, unspecified: Secondary | ICD-10-CM | POA: Diagnosis not present

## 2015-04-14 DIAGNOSIS — E1065 Type 1 diabetes mellitus with hyperglycemia: Secondary | ICD-10-CM

## 2015-04-14 DIAGNOSIS — R109 Unspecified abdominal pain: Secondary | ICD-10-CM

## 2015-04-14 DIAGNOSIS — Z3202 Encounter for pregnancy test, result negative: Secondary | ICD-10-CM | POA: Insufficient documentation

## 2015-04-14 DIAGNOSIS — R112 Nausea with vomiting, unspecified: Secondary | ICD-10-CM | POA: Diagnosis present

## 2015-04-14 HISTORY — DX: Anxiety disorder, unspecified: F41.9

## 2015-04-14 HISTORY — DX: Pre-excitation syndrome: I45.6

## 2015-04-14 HISTORY — DX: Major depressive disorder, single episode, unspecified: F32.9

## 2015-04-14 HISTORY — DX: Irritable bowel syndrome, unspecified: K58.9

## 2015-04-14 HISTORY — DX: Depression, unspecified: F32.A

## 2015-04-14 LAB — CBC WITH DIFFERENTIAL/PLATELET
BASOS PCT: 1 %
Basophils Absolute: 0 10*3/uL (ref 0–0.1)
Eosinophils Absolute: 0.1 10*3/uL (ref 0–0.7)
Eosinophils Relative: 1 %
HCT: 39.2 % (ref 35.0–47.0)
Hemoglobin: 13.1 g/dL (ref 12.0–16.0)
Lymphocytes Relative: 29 %
Lymphs Abs: 1.6 10*3/uL (ref 1.0–3.6)
MCH: 30.1 pg (ref 26.0–34.0)
MCHC: 33.3 g/dL (ref 32.0–36.0)
MCV: 90.4 fL (ref 80.0–100.0)
Monocytes Absolute: 0.5 10*3/uL (ref 0.2–0.9)
Monocytes Relative: 10 %
NEUTROS PCT: 59 %
Neutro Abs: 3.3 10*3/uL (ref 1.4–6.5)
PLATELETS: 221 10*3/uL (ref 150–440)
RBC: 4.33 MIL/uL (ref 3.80–5.20)
RDW: 14.7 % — AB (ref 11.5–14.5)
WBC: 5.6 10*3/uL (ref 3.6–11.0)

## 2015-04-14 LAB — COMPREHENSIVE METABOLIC PANEL
ALT: 15 U/L (ref 14–54)
ANION GAP: 6 (ref 5–15)
AST: 21 U/L (ref 15–41)
Albumin: 3.9 g/dL (ref 3.5–5.0)
Alkaline Phosphatase: 47 U/L (ref 38–126)
BUN: 16 mg/dL (ref 6–20)
CALCIUM: 9 mg/dL (ref 8.9–10.3)
CO2: 29 mmol/L (ref 22–32)
CREATININE: 0.92 mg/dL (ref 0.44–1.00)
Chloride: 102 mmol/L (ref 101–111)
Glucose, Bld: 204 mg/dL — ABNORMAL HIGH (ref 65–99)
POTASSIUM: 4 mmol/L (ref 3.5–5.1)
SODIUM: 137 mmol/L (ref 135–145)
Total Bilirubin: 0.6 mg/dL (ref 0.3–1.2)
Total Protein: 6.9 g/dL (ref 6.5–8.1)

## 2015-04-14 LAB — URINALYSIS COMPLETE WITH MICROSCOPIC (ARMC ONLY)
BACTERIA UA: NONE SEEN
BILIRUBIN URINE: NEGATIVE
GLUCOSE, UA: 150 mg/dL — AB
Hgb urine dipstick: NEGATIVE
Nitrite: NEGATIVE
Protein, ur: NEGATIVE mg/dL
Specific Gravity, Urine: 1.021 (ref 1.005–1.030)
pH: 6 (ref 5.0–8.0)

## 2015-04-14 LAB — GLUCOSE, CAPILLARY: Glucose-Capillary: 119 mg/dL — ABNORMAL HIGH (ref 65–99)

## 2015-04-14 LAB — LIPASE, BLOOD: LIPASE: 37 U/L (ref 22–51)

## 2015-04-14 LAB — TROPONIN I: Troponin I: <0.03 ng/mL (ref <0.031)

## 2015-04-14 LAB — PREGNANCY, URINE: Preg Test, Ur: NEGATIVE

## 2015-04-14 MED ORDER — ONDANSETRON HCL 4 MG PO TABS: 4.0000 mg | ORAL_TABLET | Freq: Three times a day (TID) | ORAL | Status: DC | PRN

## 2015-04-14 MED ORDER — SODIUM CHLORIDE 0.9 % IV BOLUS (SEPSIS)
1000.0000 mL | Freq: Once | INTRAVENOUS | Status: AC
  Administered 2015-04-14: 1000 mL via INTRAVENOUS

## 2015-04-14 MED ORDER — ONDANSETRON HCL 4 MG/2ML IJ SOLN
4.0000 mg | Freq: Once | INTRAMUSCULAR | Status: AC
  Administered 2015-04-14: 4 mg via INTRAVENOUS

## 2015-04-14 MED ORDER — ONDANSETRON HCL 4 MG/2ML IJ SOLN
INTRAMUSCULAR | Status: AC
  Administered 2015-04-14: 4 mg via INTRAVENOUS
  Filled 2015-04-14: qty 2

## 2015-04-14 MED ORDER — MAGNESIUM CITRATE PO SOLN: 1.0000 | Freq: Once | ORAL | Status: DC

## 2015-04-14 NOTE — Discharge Instructions (Signed)

## 2015-04-14 NOTE — ED Provider Notes (Signed)
-----------------------------------------   4:55 PM on 04/14/2015 -----------------------------------------  EKG reviewed and interpreted by myself shows 71 bpm, narrow QRS, normal axis, normal intervals, no ST changes noted. Overall normal EKG  Minna Antis, MD 04/14/15 1656

## 2015-04-14 NOTE — ED Provider Notes (Signed)
[facility] Emergency Department Provider Note ____________________________________________  Time seen: Approximately 3:21 PM  I have reviewed the triage vital signs and the nursing notes.   HISTORY  Chief Complaint Emesis   HPI Jody Chavez is a 34 y.o. female who presents to the ER for nausea, vomiting and constipation. Nausea started a few days ago. Constipation x 2-3 days and vomiting started to day. She is able to tolerate fluids without vomiting, but has had a decreased appetite. She has vomited 3 times today.   Past Medical History  Diagnosis Date  . Hypertension   . Diabetes mellitus without complication   . IBS (irritable bowel syndrome)   . Wolff-Parkinson-White (WPW) syndrome   . Anxiety   . Depressed     There are no active problems to display for this patient.   Past Surgical History  Procedure Laterality Date  . Cholecystectomy    . Heart ablation    . Abdominal surgery    . Cardiac electrophysiology mapping and ablation    . Endometrial ablation      No current outpatient prescriptions on file.  Allergies Penicillins  No family history on file.  Social History History  Substance Use Topics  . Smoking status: Never Smoker   . Smokeless tobacco: Never Used  . Alcohol Use: Yes    Review of Systems Constitutional: No fever/chills Eyes: No visual changes. ENT: No sore throat. Cardiovascular: Denies chest pain. Respiratory: Denies shortness of breath. Gastrointestinal: Left lower quadrant pain.  Positive for nausea, positive for vomiting.  No diarrhea.  Positive for constipation. Genitourinary: Negative for dysuria. Musculoskeletal: Negative for back pain. Skin: Negative for rash. Neurological: Negative for headaches, focal weakness or numbness.  10-point ROS otherwise negative.  ____________________________________________   PHYSICAL EXAM:  VITAL SIGNS: ED Triage Vitals  Enc Vitals Group     BP 04/14/15  1443 124/82 mmHg     Pulse Rate 04/14/15 1443 76     Resp 04/14/15 1443 18     Temp 04/14/15 1443 98.2 F (36.8 C)     Temp src --      SpO2 04/14/15 1443 99 %     Weight 04/14/15 1443 173 lb (78.472 kg)     Height 04/14/15 1443 5\' 3"  (1.6 m)     Head Cir --      Peak Flow --      Pain Score 04/14/15 1444 7     Pain Loc --      Pain Edu? --      Excl. in GC? --     Constitutional: Alert and oriented. Well appearing and in no acute distress. Eyes: Conjunctivae are normal. PERRL. EOMI. Head: Atraumatic. Nose: No congestion/rhinnorhea. Mouth/Throat: Mucous membranes are moist.  Oropharynx non-erythematous. Neck: No stridor.   Cardiovascular: Normal rate, regular rhythm. Grossly normal heart sounds.  Good peripheral circulation. Respiratory: Normal respiratory effort.  No retractions. Lungs CTAB. Gastrointestinal: Soft and mildly tender in the left lower quadrant without rebound or guarding on exam.  Musculoskeletal: No lower extremity tenderness nor edema.  No joint effusions. Neurologic:  Normal speech and language. No gross focal neurologic deficits are appreciated. Speech is normal. No gait instability. Skin:  Skin is warm, dry and intact. No rash noted. Psychiatric: Mood and affect are normal. Speech and behavior are normal.  ____________________________________________   LABS (all labs ordered are listed, but only abnormal results are displayed)  Labs Reviewed  CBC WITH DIFFERENTIAL/PLATELET - Abnormal; Notable for the following:  RDW 14.7 (*)    All other components within normal limits  URINALYSIS COMPLETEWITH MICROSCOPIC ([facility] ONLY) - Abnormal; Notable for the following:    Color, Urine YELLOW (*)    APPearance CLEAR (*)    Glucose, UA 150 (*)    Ketones, ur TRACE (*)    Leukocytes, UA TRACE (*)    Squamous Epithelial / LPF 6-30 (*)    All other components within normal limits  COMPREHENSIVE METABOLIC PANEL  TROPONIN I  POC URINE PREG, ED    ____________________________________________  EKG  Normal sinus rhythm, narrow complex QRS, no ST elevation, normal axis. Rate of 71. ____________________________________________  RADIOLOGY  Normal bowel gas pattern. ____________________________________________   PROCEDURES  Procedure(s) performed: None  Critical Care performed: No  ____________________________________________   INITIAL IMPRESSION / ASSESSMENT AND PLAN / ED COURSE  Pertinent labs & imaging results that were available during my care of the patient were reviewed by me and considered in my medical decision making (see chart for details).  IV fluids and ondansetron to be given, then will reassess. Patient in no distress on initial exam.  ----------------------------------------- 5:10 PM on 04/14/2015 -----------------------------------------  Patient reports nausea is relieved, denies further vomiting. Continues to complain of mild left lower quadrant pain with deep palpation. Will get plain abdominal film to assess for constipation and bowel gas pattern. Lipase added to labs.  ----------------------------------------- 6:47 PM on 04/14/2015 -----------------------------------------  Patient reports she is pain-free. She was advised to follow up with her primary care provider. She is advised to return to the emergency department for symptoms change or worsening symptoms schedule an appointment. ____________________________________________   FINAL CLINICAL IMPRESSION(S) / ED DIAGNOSES  Final diagnoses:  None      Chinita Pester, FNP 04/14/15 1848  Minna Antis, MD 04/14/15 2319

## 2015-04-14 NOTE — ED Notes (Signed)
abd pain for a couple of days and now headache and vomiting. No vomiting in triage.

## 2015-05-12 ENCOUNTER — Ambulatory Visit
Admission: EM | Admit: 2015-05-12 | Discharge: 2015-05-12 | Disposition: A | Discharge status: Home / Self Care | Payer: 59 | Attending: Family Medicine | Admitting: Family Medicine

## 2015-05-12 ENCOUNTER — Encounter: Payer: Self-pay | Admitting: Emergency Medicine

## 2015-05-12 DIAGNOSIS — T148 Other injury of unspecified body region: Secondary | ICD-10-CM | POA: Diagnosis not present

## 2015-05-12 DIAGNOSIS — W57XXXA Bitten or stung by nonvenomous insect and other nonvenomous arthropods, initial encounter: Secondary | ICD-10-CM

## 2015-05-12 MED ORDER — METHYLPREDNISOLONE SODIUM SUCC 125 MG IJ SOLR
125.0000 mg | Freq: Once | INTRAMUSCULAR | Status: AC
  Administered 2015-05-12: 125 mg via INTRAMUSCULAR

## 2015-05-12 MED ORDER — PREDNISONE 10 MG PO TABS: ORAL_TABLET | ORAL | Status: DC

## 2015-05-12 NOTE — ED Notes (Signed)
Patient c/o itchy rash off and on for 3 months.

## 2015-05-12 NOTE — ED Provider Notes (Signed)
CSN: [id number]     Arrival date & time 05/12/15  1332 History   First MD Initiated Contact with Patient 05/12/15 1438     Chief Complaint  Patient presents with  . Rash   (Consider location/radiation/quality/duration/timing/severity/associated sxs/prior Treatment) HPI  Patient presents for evaluation of diffuse rash, which developed approximately one week ago. She has been seen previously for similar symptoms, stating that this is the third episode in the past 3 months. The rash appears to be bug bites, which she first noticed upon awakening and new spots have developed in different areas over the past few days. She complains of itching and redness, but denies any shortness of breath. She denies any changes in detergent or soaps or any other potential aggravating agents.  Past Medical History  Diagnosis Date  . Hypertension   . Diabetes mellitus without complication   . IBS (irritable bowel syndrome)   . Wolff-Parkinson-White (WPW) syndrome   . Anxiety   . Depressed    Past Surgical History  Procedure Laterality Date  . Cholecystectomy    . Heart ablation    . Abdominal surgery    . Cardiac electrophysiology mapping and ablation    . Endometrial ablation    . Cesarean section     History reviewed. No pertinent family history. History  Substance Use Topics  . Smoking status: Never Smoker   . Smokeless tobacco: Never Used  . Alcohol Use: Yes   OB History    No data available     Review of Systems  Constitutional: Negative for fever and chills.  HENT: Negative for trouble swallowing.   Eyes: Negative for redness and itching.  Respiratory: Negative for cough, chest tightness and shortness of breath.   Cardiovascular: Negative for chest pain.  Skin: Positive for rash.  Neurological: Negative for dizziness and headaches.    Allergies  Iodine; Shellfish allergy; and Penicillins  Home Medications   Prior to Admission medications   Medication Sig Start Date End Date  Taking? Authorizing Provider  amLODipine (NORVASC) 10 MG tablet Take 10 mg by mouth daily.    Historical Provider, MD  insulin aspart (NOVOLOG) 100 UNIT/ML injection Inject into the skin 3 (three) times daily before meals.    Historical Provider, MD  insulin detemir (LEVEMIR) 100 UNIT/ML injection Inject 26 Units into the skin at bedtime.    Historical Provider, MD  lisinopril (PRINIVIL,ZESTRIL) 10 MG tablet Take 40 mg by mouth daily.     Historical Provider, MD  magnesium citrate SOLN Take 296 mLs (1 Bottle total) by mouth once. 04/14/15   Chinita Pester, FNP  ondansetron (ZOFRAN) 4 MG tablet Take 1 tablet (4 mg total) by mouth every 8 (eight) hours as needed for nausea or vomiting. 04/14/15   Chinita Pester, FNP  sertraline (ZOLOFT) 50 MG tablet Take 50 mg by mouth daily.    Historical Provider, MD   BP 126/91 mmHg  Pulse 79  Temp(Src) 96.4 F (35.8 C) (Tympanic)  Resp 16  Ht  (1.6 m)  Wt 176 lb (79.833 kg)  BMI 31.18 kg/m2  SpO2 100%  LMP 05/10/2015 (Exact Date) Physical Exam  Constitutional: She is oriented to person, place, and time. She appears well-developed and well-nourished.  Neurological: She is alert and oriented to person, place, and time.  Skin: Skin is warm, dry and intact. Rash noted. Rash is papular.     Psychiatric: She has a normal mood and affect. Her behavior is normal.    ED  Course  Procedures (including critical care time) Labs Review Labs Reviewed - No data to display  Imaging Review No results found.   MDM   1. Multiple insect bites    Patient was given an IM injection of Solu-Medrol in clinic and was prescribed a 12-day prednisone taper. She will consider having an exterminator come to her house to assess for a potential cause of these recurring insect bites. Follow-up as needed.    Carlean PurlNicole L Jonika Critz, PA-C 05/12/15 [phone]

## 2015-05-20 ENCOUNTER — Ambulatory Visit: Payer: Self-pay | Admitting: Psychiatry

## 2015-07-04 ENCOUNTER — Emergency Department
Admission: EM | Admit: 2015-07-04 | Discharge: 2015-07-04 | Disposition: A | Discharge status: Home / Self Care | Payer: 59 | Attending: Emergency Medicine | Admitting: Emergency Medicine

## 2015-07-04 ENCOUNTER — Encounter: Payer: Self-pay | Admitting: Emergency Medicine

## 2015-07-04 DIAGNOSIS — Z794 Long term (current) use of insulin: Secondary | ICD-10-CM | POA: Diagnosis not present

## 2015-07-04 DIAGNOSIS — E1165 Type 2 diabetes mellitus with hyperglycemia: Secondary | ICD-10-CM | POA: Diagnosis present

## 2015-07-04 DIAGNOSIS — E860 Dehydration: Secondary | ICD-10-CM | POA: Diagnosis not present

## 2015-07-04 DIAGNOSIS — I10 Essential (primary) hypertension: Secondary | ICD-10-CM | POA: Insufficient documentation

## 2015-07-04 DIAGNOSIS — Z79899 Other long term (current) drug therapy: Secondary | ICD-10-CM | POA: Insufficient documentation

## 2015-07-04 DIAGNOSIS — R109 Unspecified abdominal pain: Secondary | ICD-10-CM | POA: Diagnosis not present

## 2015-07-04 DIAGNOSIS — R739 Hyperglycemia, unspecified: Secondary | ICD-10-CM

## 2015-07-04 DIAGNOSIS — Z880 Allergy status to penicillin: Secondary | ICD-10-CM | POA: Diagnosis not present

## 2015-07-04 LAB — URINALYSIS COMPLETE WITH MICROSCOPIC (ARMC ONLY)
BILIRUBIN URINE: NEGATIVE
GLUCOSE, UA: 50 mg/dL — AB
Ketones, ur: NEGATIVE mg/dL
Leukocytes, UA: NEGATIVE
NITRITE: NEGATIVE
Protein, ur: NEGATIVE mg/dL
Specific Gravity, Urine: 1.002 — ABNORMAL LOW (ref 1.005–1.030)
pH: 8 (ref 5.0–8.0)

## 2015-07-04 LAB — CBC
HEMATOCRIT: 39.1 % (ref 35.0–47.0)
Hemoglobin: 13.4 g/dL (ref 12.0–16.0)
MCH: 30.7 pg (ref 26.0–34.0)
MCHC: 34.1 g/dL (ref 32.0–36.0)
MCV: 90 fL (ref 80.0–100.0)
Platelets: 247 10*3/uL (ref 150–440)
RBC: 4.35 MIL/uL (ref 3.80–5.20)
RDW: 14.1 % (ref 11.5–14.5)
WBC: 8.8 10*3/uL (ref 3.6–11.0)

## 2015-07-04 LAB — COMPREHENSIVE METABOLIC PANEL
ALBUMIN: 4.4 g/dL (ref 3.5–5.0)
ALK PHOS: 53 U/L (ref 38–126)
ALT: 19 U/L (ref 14–54)
AST: 29 U/L (ref 15–41)
Anion gap: 8 (ref 5–15)
BILIRUBIN TOTAL: 0.7 mg/dL (ref 0.3–1.2)
BUN: 16 mg/dL (ref 6–20)
CALCIUM: 9.2 mg/dL (ref 8.9–10.3)
CO2: 24 mmol/L (ref 22–32)
CREATININE: 0.79 mg/dL (ref 0.44–1.00)
Chloride: 102 mmol/L (ref 101–111)
GFR calc Af Amer: >60 mL/min (ref >60)
GFR calc non Af Amer: >60 mL/min (ref >60)
GLUCOSE: 91 mg/dL (ref 65–99)
POTASSIUM: 3.6 mmol/L (ref 3.5–5.1)
Sodium: 134 mmol/L — ABNORMAL LOW (ref 135–145)
TOTAL PROTEIN: 7.5 g/dL (ref 6.5–8.1)

## 2015-07-04 LAB — LIPASE, BLOOD: Lipase: 21 U/L — ABNORMAL LOW (ref 22–51)

## 2015-07-04 LAB — POCT PREGNANCY, URINE: Preg Test, Ur: NEGATIVE

## 2015-07-04 LAB — GLUCOSE, CAPILLARY
GLUCOSE-CAPILLARY: 125 mg/dL — AB (ref 65–99)
GLUCOSE-CAPILLARY: 48 mg/dL — AB (ref 65–99)
GLUCOSE-CAPILLARY: 84 mg/dL (ref 65–99)

## 2015-07-04 MED ORDER — SODIUM CHLORIDE 0.9 % IV BOLUS (SEPSIS)
1000.0000 mL | Freq: Once | INTRAVENOUS | Status: AC
  Administered 2015-07-04: 1000 mL via INTRAVENOUS

## 2015-07-04 MED ORDER — PROMETHAZINE HCL 25 MG/ML IJ SOLN
25.0000 mg | Freq: Once | INTRAMUSCULAR | Status: AC
  Administered 2015-07-04: 25 mg via INTRAVENOUS
  Filled 2015-07-04: qty 1

## 2015-07-04 NOTE — Discharge Instructions (Signed)
Dehydration, Adult °Dehydration is when you lose more fluids from the body than you take in. Vital organs like the kidneys, brain, and heart cannot function without a proper amount of fluids and salt. Any loss of fluids from the body can cause dehydration.  °CAUSES  °· Vomiting. °· Diarrhea. °· Excessive sweating. °· Excessive urine output. °· Fever. °SYMPTOMS  °Mild dehydration °· Thirst. °· Dry lips. °· Slightly dry mouth. °Moderate dehydration °· Very dry mouth. °· Sunken eyes. °· Skin does not bounce back quickly when lightly pinched and released. °· Dark urine and decreased urine production. °· Decreased tear production. °· Headache. °Severe dehydration °· Very dry mouth. °· Extreme thirst. °· Rapid, weak pulse (more than 100 beats per minute at rest). °· Cold hands and feet. °· Not able to sweat in spite of heat and temperature. °· Rapid breathing. °· Blue lips. °· Confusion and lethargy. °· Difficulty being awakened. °· Minimal urine production. °· No tears. °DIAGNOSIS  °Your caregiver will diagnose dehydration based on your symptoms and your exam. Blood and urine tests will help confirm the diagnosis. The diagnostic evaluation should also identify the cause of dehydration. °TREATMENT  °Treatment of mild or moderate dehydration can often be done at home by increasing the amount of fluids that you drink. It is best to drink small amounts of fluid more often. Drinking too much at one time can make vomiting worse. Refer to the home care instructions below. °Severe dehydration needs to be treated at the hospital where you will probably be given intravenous (IV) fluids that contain water and electrolytes. °HOME CARE INSTRUCTIONS  °· Ask your caregiver about specific rehydration instructions. °· Drink enough fluids to keep your urine clear or pale yellow. °· Drink small amounts frequently if you have nausea and vomiting. °· Eat as you normally do. °· Avoid: °¨ Foods or drinks high in sugar. °¨ Carbonated  drinks. °¨ Juice. °¨ Extremely hot or cold fluids. °¨ Drinks with caffeine. °¨ Fatty, greasy foods. °¨ Alcohol. °¨ Tobacco. °¨ Overeating. °¨ Gelatin desserts. °· Wash your hands well to avoid spreading bacteria and viruses. °· Only take over-the-counter or prescription medicines for pain, discomfort, or fever as directed by your caregiver. °· Ask your caregiver if you should continue all prescribed and over-the-counter medicines. °· Keep all follow-up appointments with your caregiver. °SEEK MEDICAL CARE IF: °· You have abdominal pain and it increases or stays in one area (localizes). °· You have a rash, stiff neck, or severe headache. °· You are irritable, sleepy, or difficult to awaken. °· You are weak, dizzy, or extremely thirsty. °SEEK IMMEDIATE MEDICAL CARE IF:  °· You are unable to keep fluids down or you get worse despite treatment. °· You have frequent episodes of vomiting or diarrhea. °· You have blood or green matter (bile) in your vomit. °· You have blood in your stool or your stool looks black and tarry. °· You have not urinated in 6 to 8 hours, or you have only urinated a small amount of very dark urine. °· You have a fever. °· You faint. °MAKE SURE YOU:  °· Understand these instructions. °· Will watch your condition. °· Will get help right away if you are not doing well or get worse. °Document Released: 10/17/2005 Document Revised: 01/09/2012 Document Reviewed: 06/06/2011 °[employer]® Patient Information ©2015 [employer]. This information is not intended to replace advice given to you by your health care provider. Make sure you discuss any questions you have with your health care   provider.  High Blood Sugar High blood sugar (hyperglycemia) means that the level of sugar in your blood is higher than it should be. Signs of high blood sugar include:  Feeling thirsty.  Frequent peeing (urinating).  Feeling tired or sleepy.  Dry mouth.  Vision changes.  Feeling weak.  Feeling hungry but  losing weight.  Numbness and tingling in your hands or feet.  Headache. When you ignore these signs, your blood sugar may keep going up. These problems may get worse, and other problems may begin. HOME CARE  Check your blood sugars as told by your doctor. Write down the numbers with the date and time.  Take the right amount of insulin or diabetes pills at the right time. Write down the dose with date and time.  Refill your insulin or diabetes pills before running out.  Watch what you eat. Follow your meal plan.  Drink liquids without sugar, such as water. Check with your doctor if you have kidney or heart disease.  Follow your doctor's orders for exercise. Exercise at the same time of day.  Keep your doctor's appointments. GET HELP RIGHT AWAY IF:   You have trouble thinking or are confused.  You have fast breathing with fruity smelling breath.  You pass out (faint).  You have 2 to 3 days of high blood sugars and you do not know why.  You have chest pain.  You are feeling sick to your stomach (nauseous) or throwing up (vomiting).  You have sudden vision changes. MAKE SURE YOU:   Understand these instructions.  Will watch your condition.  Will get help right away if you are not doing well or get worse. Document Released: 08/14/2009 Document Revised: 01/09/2012 Document Reviewed: 08/14/2009 [facility] Patient Information 2015 [place], [place]. This information is not intended to replace advice given to you by your health care provider. Make sure you discuss any questions you have with your health care provider.

## 2015-07-04 NOTE — ED Notes (Signed)
MD made aware of current CBG reading, MD advised to continue with discharge disposition.

## 2015-07-04 NOTE — ED Notes (Addendum)
Patient reports she hasn't been able to get her blood sugar down at home, "its been in the 600's".  Patient also reports nauseated and abdominal pain for 2 days.

## 2015-07-04 NOTE — ED Provider Notes (Signed)
[facility] Emergency Department Provider Note  Time seen: 3:18 AM  I have reviewed the triage vital signs and the nursing notes.   HISTORY  Chief Complaint Hyperglycemia and Abdominal Pain    HPI Sumire Halbleib is a 34 y.o. female with a past medical history of hypertension, diabetes, irritable bowel syndrome, presents the emergency department with elevated blood glucose. According to the patient her blood sugars have been in the 5 to 600s all day today. Patient states she has been dosing her insulin and drinking plenty of fluids, and was finally able to get her blood glucose back into the normal range. She states she still came to the emergency department for evaluation because she felt nauseated and dehydrated. States her abdomen has been uncomfortable for the past 2 days but not "painful." Patient has a history of irritable bowel syndrome. Denies any diarrhea. Does state feels nauseated but denies vomiting. Denies fever. Denies cough, congestion, dysuria. Describes her abdominal discomfort is mild to moderate. Nausea is moderate. And feels "dehydrated."    Past Medical History  Diagnosis Date  . Hypertension   . Diabetes mellitus without complication   . IBS (irritable bowel syndrome)   . Wolff-Parkinson-White (WPW) syndrome   . Anxiety   . Depressed     There are no active problems to display for this patient.   Past Surgical History  Procedure Laterality Date  . Cholecystectomy    . Heart ablation    . Abdominal surgery    . Cardiac electrophysiology mapping and ablation    . Endometrial ablation    . Cesarean section      Current Outpatient Rx  Name  Route  Sig  Dispense  Refill  . amLODipine (NORVASC) 10 MG tablet   Oral   Take 10 mg by mouth daily.         . insulin aspart (NOVOLOG) 100 UNIT/ML injection   Subcutaneous   Inject into the skin 3 (three) times daily before meals.         . insulin detemir (LEVEMIR) 100 UNIT/ML  injection   Subcutaneous   Inject 26 Units into the skin at bedtime.         Marland Kitchen lisinopril (PRINIVIL,ZESTRIL) 10 MG tablet   Oral   Take 40 mg by mouth daily.          . magnesium citrate SOLN   Oral   Take 296 mLs (1 Bottle total) by mouth once.   195 mL   0   . ondansetron (ZOFRAN) 4 MG tablet   Oral   Take 1 tablet (4 mg total) by mouth every 8 (eight) hours as needed for nausea or vomiting.   20 tablet   0   . predniSONE (DELTASONE) 10 MG tablet      Take 60 mg x 2 days, then 50 mg x 2 days, then 40 mg x 2 days, then 30 mg x 2 days, then 20 mg x 2 days, then 10 mg x 2 days   42 tablet   0   . sertraline (ZOLOFT) 50 MG tablet   Oral   Take 50 mg by mouth daily.           Allergies Iodine; Shellfish allergy; and Penicillins  No family history on file.  Social History Social History  Substance Use Topics  . Smoking status: Never Smoker   . Smokeless tobacco: Never Used  . Alcohol Use: Yes    Review of Systems Constitutional:  Negative for fever. Cardiovascular: Negative for chest pain. Respiratory: Negative for shortness of breath. Gastrointestinal: Positive for abdominal discomfort and nausea. Negative for vomiting or diarrhea. Genitourinary: Negative for dysuria. Neurological: Negative for headache 10-point ROS otherwise negative.  ____________________________________________   PHYSICAL EXAM:  VITAL SIGNS: ED Triage Vitals  Enc Vitals Group     BP 07/04/15 0029 138/93 mmHg     Pulse Rate 07/04/15 0029 111     Resp 07/04/15 0029 20     Temp 07/04/15 0029 98 F (36.7 C)     Temp Source 07/04/15 0029 Oral     SpO2 07/04/15 0029 98 %     Weight 07/04/15 0029 178 lb (80.74 kg)     Height 07/04/15 0029  (1.6 m)     Head Cir --      Peak Flow --      Pain Score 07/04/15 0030 6     Pain Loc --      Pain Edu? --      Excl. in GC? --     Constitutional: Alert and oriented. Well appearing and in no distress. Eyes: Normal exam ENT    Mouth/Throat: Mucous membranes are moist. Cardiovascular: Normal rate, regular rhythm. No murmur Respiratory: Normal respiratory effort without tachypnea nor retractions. Breath sounds are clear and equal bilaterally. No wheezes/rales/rhonchi. Gastrointestinal: Soft and nontender. No distention.   Musculoskeletal: Nontender with normal range of motion in all extremities. Neurologic:  Normal speech and language. No gross focal neurologic deficits  Skin:  Skin is warm, dry and intact.  Psychiatric: Mood and affect are normal. Speech and behavior are normal.   ____________________________________________    INITIAL IMPRESSION / ASSESSMENT AND PLAN / ED COURSE  Pertinent labs & imaging results that were available during my care of the patient were reviewed by me and considered in my medical decision making (see chart for details).  Patient with hyperglycemia, uses an insulin pump and was able to get her blood sugar down the insulin and fluids. Patient states she continues to feel nauseated with abdominal discomfort so she came to the emergency department for evaluation. Patient's labs are largely within normal limits including her glucose. We will IV hydrate, and treat the patient's nausea while closely monitoring in the emergency department. Patient agreeable to plan.  Patient feeling better. Sleeping, awakens easily. Patient's blood glucose was low with a fingerstick of 48, patient states she had not eaten today, given a meal tray and feels much better. Discussed with the patient continued oral hydration at home, eating small meals, with close monitoring of her blood glucose and insulin dosing. Patient is agreeable to plan of follow-up with her primary care doctor.  ____________________________________________   FINAL CLINICAL IMPRESSION(S) / ED DIAGNOSES  Hyperglycemia Dehydration   Minna Antis, MD 07/04/15 [id number]

## 2015-07-04 NOTE — ED Notes (Signed)
CBG value of 48

## 2015-07-04 NOTE — ED Notes (Signed)
Patient with no complaints at this time. Respirations even and unlabored. Skin warm/dry. Discharge instructions reviewed with patient at this time. Patient given opportunity to voice concerns/ask questions. IV removed per policy and band-aid applied to site. Patient discharged at this time and left Emergency Department with steady gait.  

## 2015-07-04 NOTE — ED Notes (Signed)
CBG value of 125

## 2015-07-04 NOTE — ED Notes (Signed)
MD made ware of current CBG reading of 48. MD advised to give pt food tray and sugar beverage. Will complete task accordingly.

## 2015-07-16 ENCOUNTER — Ambulatory Visit: Payer: 59

## 2015-07-16 ENCOUNTER — Ambulatory Visit
Admission: EM | Admit: 2015-07-16 | Discharge: 2015-07-16 | Disposition: A | Discharge status: Home / Self Care | Payer: 59 | Attending: Family Medicine | Admitting: Family Medicine

## 2015-07-16 ENCOUNTER — Encounter: Payer: Self-pay | Admitting: *Deleted

## 2015-07-16 DIAGNOSIS — M7551 Bursitis of right shoulder: Secondary | ICD-10-CM

## 2015-07-16 DIAGNOSIS — M542 Cervicalgia: Secondary | ICD-10-CM

## 2015-07-16 MED ORDER — ORPHENADRINE CITRATE ER 100 MG PO TB12: 100.0000 mg | ORAL_TABLET | Freq: Two times a day (BID) | ORAL | Status: DC

## 2015-07-16 MED ORDER — MELOXICAM 15 MG PO TABS: 15.0000 mg | ORAL_TABLET | Freq: Every day | ORAL | Status: DC

## 2015-07-16 MED ORDER — KETOROLAC TROMETHAMINE 60 MG/2ML IM SOLN
60.0000 mg | Freq: Once | INTRAMUSCULAR | Status: AC
  Administered 2015-07-16: 60 mg via INTRAMUSCULAR

## 2015-07-16 NOTE — ED Notes (Signed)
Shoulder pain started one week ago and started radiating down the right arm yesterday. Does a lot of lifting at work.

## 2015-07-16 NOTE — ED Provider Notes (Signed)
CSN: [id number]     Arrival date & time 07/16/15  1202 History   First MD Initiated Contact with Patient 07/16/15 1427     Chief Complaint  Patient presents with  . Arm Pain    right side  . Shoulder Pain    right side   patient reports having arm shoulder pain for about a week. She denies any injury to the arm or shoulder states that when she lays down is worse she is in tears as the pain is so great. (Consider location/radiation/quality/duration/timing/severity/associated sxs/prior Treatment) Patient is a 34 y.o. female presenting with arm pain and shoulder pain. The history is provided by the patient.  Arm Pain This is a new problem. The current episode started more than 1 week ago. The problem occurs constantly. The problem has been gradually worsening. Pertinent negatives include no chest pain and no abdominal pain. The symptoms are aggravated by twisting (Laying on her back). Nothing relieves the symptoms. Treatments tried: Ibuprofen. The treatment provided no relief.  Shoulder Pain Associated symptoms: back pain and neck pain     Past Medical History  Diagnosis Date  . Hypertension   . Diabetes mellitus without complication   . IBS (irritable bowel syndrome)   . Wolff-Parkinson-White (WPW) syndrome   . Anxiety   . Depressed    Past Surgical History  Procedure Laterality Date  . Cholecystectomy    . Heart ablation    . Abdominal surgery    . Cardiac electrophysiology mapping and ablation    . Endometrial ablation    . Cesarean section     Family History  Problem Relation Age of Onset  . Gallbladder disease Mother    Social History  Substance Use Topics  . Smoking status: Never Smoker   . Smokeless tobacco: Never Used  . Alcohol Use: Yes   OB History    No data available     Review of Systems  Cardiovascular: Negative for chest pain.  Gastrointestinal: Negative for abdominal pain.  Musculoskeletal: Positive for myalgias, back pain, neck pain and neck  stiffness.  All other systems reviewed and are negative.  Patient does have a history of diabetes anxiety and irritable bowel. She does not smoke. Nurse's notes were reviewed. Allergies  Iodine; Shellfish allergy; and Penicillins  Home Medications   Prior to Admission medications   Medication Sig Start Date End Date Taking? Authorizing Provider  amLODipine (NORVASC) 10 MG tablet Take 10 mg by mouth daily.   Yes Historical Provider, MD  insulin aspart (NOVOLOG) 100 UNIT/ML injection Inject into the skin 3 (three) times daily before meals.   Yes Historical Provider, MD  insulin detemir (LEVEMIR) 100 UNIT/ML injection Inject 26 Units into the skin at bedtime.   Yes Historical Provider, MD  lisinopril (PRINIVIL,ZESTRIL) 10 MG tablet Take 40 mg by mouth daily.    Yes Historical Provider, MD  sertraline (ZOLOFT) 50 MG tablet Take 50 mg by mouth daily.   Yes Historical Provider, MD  magnesium citrate SOLN Take 296 mLs (1 Bottle total) by mouth once. 04/14/15   Chinita Pester, FNP  meloxicam (MOBIC) 15 MG tablet Take 1 tablet (15 mg total) by mouth daily. 07/16/15   Hassan Rowan, MD  ondansetron (ZOFRAN) 4 MG tablet Take 1 tablet (4 mg total) by mouth every 8 (eight) hours as needed for nausea or vomiting. 04/14/15   Chinita Pester, FNP  orphenadrine (NORFLEX) 100 MG tablet Take 1 tablet (100 mg total) by mouth 2 (two) times  daily. 07/16/15   Hassan Rowan, MD  predniSONE (DELTASONE) 10 MG tablet Take 60 mg x 2 days, then 50 mg x 2 days, then 40 mg x 2 days, then 30 mg x 2 days, then 20 mg x 2 days, then 10 mg x 2 days 05/12/15   Carlean Purl, PA-C   Meds Ordered and Administered this Visit   Medications  ketorolac (TORADOL) injection 60 mg (60 mg Intramuscular Given 07/16/15 1443)    BP 112/81 mmHg  Pulse 81  Temp(Src) 98.2 F (36.8 C) (Oral)  Resp 18  Ht 5\' 3"  (1.6 m)  Wt 173 lb (78.472 kg)  BMI 30.65 kg/m2  SpO2 100%  LMP 07/09/2015 No data found.   Physical Exam  Constitutional: She  is oriented to person, place, and time. She appears well-developed and well-nourished.  HENT:  Head: Normocephalic.  Eyes: Conjunctivae are normal. Pupils are equal, round, and reactive to light.  Neck: Neck supple. Muscular tenderness present. Decreased range of motion present.    Musculoskeletal:       Cervical back: She exhibits tenderness.       Back:  Patient has a right scapular bursitis on the medial inferior aspect of the scapula. Symptoms at the muscle spasms of the neck probably secondary to pain from the scapular bursitis  Lymphadenopathy:    She has cervical adenopathy.  Neurological: She is alert and oriented to person, place, and time.  Skin: Skin is warm and dry.  Psychiatric: She has a normal mood and affect. Her behavior is normal.  Vitals reviewed.   ED Course  Procedures (including critical care time)  Labs Review Labs Reviewed - No data to display  Imaging Review Dg Cervical Spine Complete  07/16/2015   CLINICAL DATA:  Neck and right shoulder pain for 1 week, no trauma  EXAM: CERVICAL SPINE  4+ VIEWS  COMPARISON:  Cervical spine films of 01/20/2015  FINDINGS: The cervical vertebra remain straightened in alignment and unchanged. Intervertebral disc spaces appear normal. No prevertebral soft tissue swelling is seen. On oblique views, the foramina are patent. The odontoid process is intact. The lung apices are clear.  IMPRESSION: Straightened alignment. Intervertebral disc spaces. No foraminal narrowing.   Electronically Signed   By: Dwyane Dee M.D.   On: 07/16/2015 15:11   Dg Scapula Right  07/16/2015   CLINICAL DATA:  Right shoulder pain for 1 week, no trauma  EXAM: RIGHT SCAPULA - 2+ VIEWS  COMPARISON:  None  FINDINGS: Views of the right scapula show no acute abnormality. Right glenohumeral joint space appears normal.  IMPRESSION: Negative.   Electronically Signed   By: Dwyane Dee M.D.   On: 07/16/2015 15:12   Dg Shoulder Right  07/16/2015   CLINICAL DATA:   Right shoulder pain for 1 week, no acute trauma  EXAM: RIGHT SHOULDER - 2+ VIEW  COMPARISON:  None.  FINDINGS: The right humeral head is in normal position within the glenohumeral joint space. No significant degenerative change is seen. The right [facility] joint is normally aligned. The right clavicle is intact.  IMPRESSION: Negative.   Electronically Signed   By: Dwyane Dee M.D.   On: 07/16/2015 15:13     Visual Acuity Review  Right Eye Distance:   Left Eye Distance:   Bilateral Distance:    Right Eye Near:   Left Eye Near:    Bilateral Near:         MDM   1. Bursitis of shoulder region, right  2. Neck pain on right side      After Toradol injection patient reports improvement with the pain in the right shoulder and the right neck. Still has some concerns about the numbness of fingers but reassured x-rays were negative for any fracture so soon that this is probably more muscle spasms causing the tingling down her right arm. Patient given a work note for today and tomorrow follow-up with orthopedic or here possible scapular bursitis injection if persistent trouble.  Hassan Rowan, MD 07/16/15 [phone]

## 2015-07-16 NOTE — Discharge Instructions (Signed)
Bursitis Bursitis is when the fluid-filled sac (bursa) that covers and protects a joint gets puffy and irritated. The elbow, shoulder, hip, and knee joints are most often affected. HOME CARE  Put ice on the area.  Put ice in a plastic bag.  Place a towel between your skin and the bag.  Leave the ice on for 15-20 minutes, 03-04 times a day.  Put the joint through a full range of motion 4 times a day. Rest the injured joint at other times. When you have less pain, begin slow movements and usual activities.  Only take medicine as told by your doctor.  Follow up with your doctor. Any delay in care could stop the bursitis from healing. This could cause long-term pain. GET HELP RIGHT AWAY IF:   You have more pain with treatment.  You have a temperature by mouth above 102 F (38.9 C), not controlled by medicine.  You have heat and irritation over the fluid-filled sac. MAKE SURE YOU:   Understand these instructions.  Will watch your condition.  Will get help right away if you are not doing well or get worse. Document Released: 04/06/2010 Document Revised: 01/09/2012 Document Reviewed: 01/06/2014 [facility] Patient Information 2015 [place], [place]. This information is not intended to replace advice given to you by your health care provider. Make sure you discuss any questions you have with your health care provider.  Cervical Sprain A cervical sprain is when the tissues (ligaments) that hold the neck bones in place stretch or tear. HOME CARE   Put ice on the injured area.  Put ice in a plastic bag.  Place a towel between your skin and the bag.  Leave the ice on for 15-20 minutes, 3-4 times a day.  You may have been given a collar to wear. This collar keeps your neck from moving while you heal.  Do not take the collar off unless told by your doctor.  If you have long hair, keep it outside of the collar.  Ask your doctor before changing the position of your collar. You may  need to change its position over time to make it more comfortable.  If you are allowed to take off the collar for cleaning or bathing, follow your doctor's instructions on how to do it safely.  Keep your collar clean by wiping it with mild soap and water. Dry it completely. If the collar has removable pads, remove them every 1-2 days to hand wash them with soap and water. Allow them to air dry. They should be dry before you wear them in the collar.  Do not drive while wearing the collar.  Only take medicine as told by your doctor.  Keep all doctor visits as told.  Keep all physical therapy visits as told.  Adjust your work station so that you have good posture while you work.  Avoid positions and activities that make your problems worse.  Warm up and stretch before being active. GET HELP IF:  Your pain is not controlled with medicine.  You cannot take less pain medicine over time as planned.  Your activity level does not improve as expected. GET HELP RIGHT AWAY IF:   You are bleeding.  Your stomach is upset.  You have an allergic reaction to your medicine.  You develop new problems that you cannot explain.  You lose feeling (become numb) or you cannot move any part of your body (paralysis).  You have tingling or weakness in any part of your body.  Your symptoms get worse. Symptoms include:  Pain, soreness, stiffness, puffiness (swelling), or a burning feeling in your neck.  Pain when your neck is touched.  Shoulder or upper back pain.  Limited ability to move your neck.  Headache.  Dizziness.  Your hands or arms feel week, lose feeling, or tingle.  Muscle spasms.  Difficulty swallowing or chewing. MAKE SURE YOU:   Understand these instructions.  Will watch your condition.  Will get help right away if you are not doing well or get worse. Document Released: 04/04/2008 Document Revised: 06/19/2013 Document Reviewed: 04/24/2013 [facility] Patient  Information 2015 [place], [place]. This information is not intended to replace advice given to you by your health care provider. Make sure you discuss any questions you have with your health care provider.

## 2015-07-22 ENCOUNTER — Other Ambulatory Visit: Payer: Self-pay | Admitting: Unknown Physician Specialty

## 2015-07-22 DIAGNOSIS — M25511 Pain in right shoulder: Secondary | ICD-10-CM

## 2015-07-22 DIAGNOSIS — G8929 Other chronic pain: Secondary | ICD-10-CM

## 2015-07-31 ENCOUNTER — Ambulatory Visit
Admission: RE | Admit: 2015-07-31 | Discharge: 2015-07-31 | Disposition: A | Discharge status: Home / Self Care | Payer: 59 | Source: Ambulatory Visit | Attending: Unknown Physician Specialty | Admitting: Unknown Physician Specialty

## 2015-07-31 DIAGNOSIS — M25511 Pain in right shoulder: Secondary | ICD-10-CM | POA: Insufficient documentation

## 2015-07-31 DIAGNOSIS — G8929 Other chronic pain: Secondary | ICD-10-CM

## 2015-07-31 DIAGNOSIS — M7551 Bursitis of right shoulder: Secondary | ICD-10-CM | POA: Insufficient documentation

## 2015-07-31 DIAGNOSIS — M75111 Incomplete rotator cuff tear or rupture of right shoulder, not specified as traumatic: Secondary | ICD-10-CM | POA: Diagnosis not present

## 2015-10-01 ENCOUNTER — Ambulatory Visit
Admission: EM | Admit: 2015-10-01 | Discharge: 2015-10-01 | Disposition: A | Discharge status: Home / Self Care | Payer: 59 | Attending: Emergency Medicine | Admitting: Emergency Medicine

## 2015-10-01 DIAGNOSIS — A499 Bacterial infection, unspecified: Secondary | ICD-10-CM

## 2015-10-01 DIAGNOSIS — E876 Hypokalemia: Secondary | ICD-10-CM | POA: Diagnosis not present

## 2015-10-01 DIAGNOSIS — N390 Urinary tract infection, site not specified: Secondary | ICD-10-CM

## 2015-10-01 DIAGNOSIS — N760 Acute vaginitis: Secondary | ICD-10-CM

## 2015-10-01 DIAGNOSIS — A5901 Trichomonal vulvovaginitis: Secondary | ICD-10-CM | POA: Diagnosis not present

## 2015-10-01 DIAGNOSIS — B9689 Other specified bacterial agents as the cause of diseases classified elsewhere: Secondary | ICD-10-CM

## 2015-10-01 LAB — URINALYSIS COMPLETE WITH MICROSCOPIC (ARMC ONLY)
Bilirubin Urine: NEGATIVE
Glucose, UA: NEGATIVE mg/dL
Ketones, ur: NEGATIVE mg/dL
Nitrite: POSITIVE — AB
PH: 5.5 (ref 5.0–8.0)
Specific Gravity, Urine: >1.03 — ABNORMAL HIGH (ref 1.005–1.030)

## 2015-10-01 LAB — WET PREP, GENITAL
Sperm: NONE SEEN
YEAST WET PREP: NONE SEEN

## 2015-10-01 LAB — CBC WITH DIFFERENTIAL/PLATELET
BASOS ABS: 0 10*3/uL (ref 0–0.1)
Basophils Relative: 1 %
Eosinophils Absolute: 0.1 10*3/uL (ref 0–0.7)
Eosinophils Relative: 2 %
HEMATOCRIT: 38.2 % (ref 35.0–47.0)
Hemoglobin: 12.5 g/dL (ref 12.0–16.0)
LYMPHS PCT: 28 %
Lymphs Abs: 2.3 10*3/uL (ref 1.0–3.6)
MCH: 27.5 pg (ref 26.0–34.0)
MCHC: 32.6 g/dL (ref 32.0–36.0)
MCV: 84.2 fL (ref 80.0–100.0)
MONO ABS: 0.6 10*3/uL (ref 0.2–0.9)
MONOS PCT: 7 %
NEUTROS ABS: 5.3 10*3/uL (ref 1.4–6.5)
Neutrophils Relative %: 62 %
Platelets: 232 10*3/uL (ref 150–440)
RBC: 4.53 MIL/uL (ref 3.80–5.20)
RDW: 14.6 % — AB (ref 11.5–14.5)
WBC: 8.4 10*3/uL (ref 3.6–11.0)

## 2015-10-01 LAB — BASIC METABOLIC PANEL
ANION GAP: 9 (ref 5–15)
BUN: 14 mg/dL (ref 6–20)
CALCIUM: 9.5 mg/dL (ref 8.9–10.3)
CHLORIDE: 99 mmol/L — AB (ref 101–111)
CO2: 28 mmol/L (ref 22–32)
Creatinine, Ser: 1.02 mg/dL — ABNORMAL HIGH (ref 0.44–1.00)
GFR calc Af Amer: >60 mL/min (ref >60)
GLUCOSE: 70 mg/dL (ref 65–99)
POTASSIUM: 3 mmol/L — AB (ref 3.5–5.1)
Sodium: 136 mmol/L (ref 135–145)

## 2015-10-01 LAB — HCG, QUANTITATIVE, PREGNANCY

## 2015-10-01 LAB — PREGNANCY, URINE: Preg Test, Ur: NEGATIVE

## 2015-10-01 MED ORDER — METRONIDAZOLE 500 MG PO TABS: 500.0000 mg | ORAL_TABLET | Freq: Two times a day (BID) | ORAL | Status: DC

## 2015-10-01 MED ORDER — POTASSIUM CHLORIDE CRYS ER 20 MEQ PO TBCR
40.0000 meq | EXTENDED_RELEASE_TABLET | Freq: Once | ORAL | Status: AC
  Administered 2015-10-01: 40 meq via ORAL

## 2015-10-01 MED ORDER — NITROFURANTOIN MONOHYD MACRO 100 MG PO CAPS: 100.0000 mg | ORAL_CAPSULE | Freq: Two times a day (BID) | ORAL | Status: DC

## 2015-10-01 NOTE — Discharge Instructions (Signed)
Take medication as prescribed. This is very important. Rest. Drink plenty of water. Eat and drink regularly. Pelvic rest. No sexual activity until completion of medication and follow-up to ensure resolution. Inform all partners. All partners need to be treated.  You need to follow-up closely with her primary care physician in 2-3 days for follow-up regarding potassium levels as discussed. He also need to follow-up your primary care physician in regards to the urinary tract infection.  Follow-up with your primary care physician or [facility] health Department in 1 week for follow-up regarding the sexual transmitted disease.  Return to urgent care proceed to the ER for abdominal pain, fever, abnormal blood sugars, weakness, dizziness, new or worsening concerns.  Bacterial Vaginosis Bacterial vaginosis is a vaginal infection that occurs when the normal balance of bacteria in the vagina is disrupted. It results from an overgrowth of certain bacteria. This is the most common vaginal infection in women of childbearing age. Treatment is important to prevent complications, especially in pregnant women, as it can cause a premature delivery. CAUSES  Bacterial vaginosis is caused by an increase in harmful bacteria that are normally present in smaller amounts in the vagina. Several different kinds of bacteria can cause bacterial vaginosis. However, the reason that the condition develops is not fully understood. RISK FACTORS Certain activities or behaviors can put you at an increased risk of developing bacterial vaginosis, including:  Having a new sex partner or multiple sex partners.  Douching.  Using an intrauterine device (IUD) for contraception. Women do not get bacterial vaginosis from toilet seats, bedding, swimming pools, or contact with objects around them. SIGNS AND SYMPTOMS  Some women with bacterial vaginosis have no signs or symptoms. Common symptoms include:  Grey vaginal discharge.  A  fishlike odor with discharge, especially after sexual intercourse.  Itching or burning of the vagina and vulva.  Burning or pain with urination. DIAGNOSIS  Your health care provider will take a medical history and examine the vagina for signs of bacterial vaginosis. A sample of vaginal fluid may be taken. Your health care provider will look at this sample under a microscope to check for bacteria and abnormal cells. A vaginal pH test may also be done.  TREATMENT  Bacterial vaginosis may be treated with antibiotic medicines. These may be given in the form of a pill or a vaginal cream. A second round of antibiotics may be prescribed if the condition comes back after treatment. Because bacterial vaginosis increases your risk for sexually transmitted diseases, getting treated can help reduce your risk for chlamydia, gonorrhea, HIV, and herpes. HOME CARE INSTRUCTIONS   Only take over-the-counter or prescription medicines as directed by your health care provider.  If antibiotic medicine was prescribed, take it as directed. Make sure you finish it even if you start to feel better.  Tell all sexual partners that you have a vaginal infection. They should see their health care provider and be treated if they have problems, such as a mild rash or itching.  During treatment, it is important that you follow these instructions:  Avoid sexual activity or use condoms correctly.  Do not douche.  Avoid alcohol as directed by your health care provider.  Avoid breastfeeding as directed by your health care provider. SEEK MEDICAL CARE IF:   Your symptoms are not improving after 3 days of treatment.  You have increased discharge or pain.  You have a fever. MAKE SURE YOU:   Understand these instructions.  Will watch your condition.  Will get help right away if you are not doing well or get worse. FOR MORE INFORMATION  Centers for Disease Control and Prevention, Division of STD Prevention:  SolutionApps.co.zawww.cdc.gov/std American Sexual Health Association (ASHA): www.ashastd.org    This information is not intended to replace advice given to you by your health care provider. Make sure you discuss any questions you have with your health care provider.   Document Released: 10/17/2005 Document Revised: 11/07/2014 Document Reviewed: 05/29/2013 Elsevier Interactive Patient Education 2016 [employer]  Hypokalemia Hypokalemia means that the amount of potassium in the blood is lower than normal.Potassium is a chemical, called an electrolyte, that helps regulate the amount of fluid in the body. It also stimulates muscle contraction and helps nerves function properly.Most of the body's potassium is inside of cells, and only a very small amount is in the blood. Because the amount in the blood is so small, minor changes can be life-threatening. CAUSES  Antibiotics.  Diarrhea or vomiting.  Using laxatives too much, which can cause diarrhea.  Chronic kidney disease.  Water pills (diuretics).  Eating disorders (bulimia).  Low magnesium level.  Sweating a lot. SIGNS AND SYMPTOMS  Weakness.  Constipation.  Fatigue.  Muscle cramps.  Mental confusion.  Skipped heartbeats or irregular heartbeat (palpitations).  Tingling or numbness. DIAGNOSIS  Your health care provider can diagnose hypokalemia with blood tests. In addition to checking your potassium level, your health care provider may also check other lab tests. TREATMENT Hypokalemia can be treated with potassium supplements taken by mouth or adjustments in your current medicines. If your potassium level is very low, you may need to get potassium through a vein (IV) and be monitored in the hospital. A diet high in potassium is also helpful. Foods high in potassium are:  Nuts, such as peanuts and pistachios.  Seeds, such as sunflower seeds and pumpkin seeds.  Peas, lentils, and lima beans.  Whole grain and bran cereals and  breads.  Fresh fruit and vegetables, such as apricots, avocado, bananas, cantaloupe, kiwi, oranges, tomatoes, asparagus, and potatoes.  Orange and tomato juices.  Red meats.  Fruit yogurt. HOME CARE INSTRUCTIONS  Take all medicines as prescribed by your health care provider.  Maintain a healthy diet by including nutritious food, such as fruits, vegetables, nuts, whole grains, and lean meats.  If you are taking a laxative, be sure to follow the directions on the label. SEEK MEDICAL CARE IF:  Your weakness gets worse.  You feel your heart pounding or racing.  You are vomiting or having diarrhea.  You are diabetic and having trouble keeping your blood glucose in the normal range. SEEK IMMEDIATE MEDICAL CARE IF:  You have chest pain, shortness of breath, or dizziness.  You are vomiting or having diarrhea for more than 2 days.  You faint. MAKE SURE YOU:   Understand these instructions.  Will watch your condition.  Will get help right away if you are not doing well or get worse.   This information is not intended to replace advice given to you by your health care provider. Make sure you discuss any questions you have with your health care provider.   Document Released: 10/17/2005 Document Revised: 11/07/2014 Document Reviewed: 04/19/2013 Elsevier Interactive Patient Education 2016 [employer]  Sexually Transmitted Disease A sexually transmitted disease (STD) is a disease or infection often passed to another person during sex. However, STDs can be passed through nonsexual ways. An STD can be passed through:  Spit (saliva).  Semen.  Blood.  Mucus from the vagina.  Pee (urine). HOW CAN I LESSEN MY CHANCES OF GETTING AN STD?  Use:  Latex condoms.  Water-soluble lubricants with condoms. Do not use petroleum jelly or oils.  Dental dams. These are small pieces of latex that are used as a barrier during oral sex.  Avoid having more than one sex partner.  Do  not have sex with someone who has other sex partners.  Do not have sex with anyone you do not know or who is at high risk for an STD.  Avoid risky sex that can break your skin.  Do not have sex if you have open sores on your mouth or skin.  Avoid drinking too much alcohol or taking illegal drugs. Alcohol and drugs can affect your good judgment.  Avoid oral and anal sex acts.  Get shots (vaccines) for HPV and hepatitis.  If you are at risk of being infected with HIV, it is advised that you take a certain medicine daily to prevent HIV infection. This is called pre-exposure prophylaxis (PrEP). You may be at risk if:  You are a man who has sex with other men (MSM).  You are attracted to the opposite sex (heterosexual) and are having sex with more than one partner.  You take drugs with a needle.  You have sex with someone who has HIV.  Talk with your doctor about if you are at high risk of being infected with HIV. If you begin to take PrEP, get tested for HIV first. Get tested every 3 months for as long as you are taking PrEP.  Get tested for STDs every year if you are sexually active. If you are treated for an STD, get tested again 3 months after you are treated. WHAT SHOULD I DO IF I THINK I HAVE AN STD?  See your doctor.  Tell your sex partner(s) that you have an STD. They should be tested and treated.  Do not have sex until your doctor says it is okay. WHEN SHOULD I GET HELP? Get help right away if:  You have bad belly (abdominal) pain.  You are a man and have puffiness (swelling) or pain in your testicles.  You are a woman and have puffiness in your vagina.   This information is not intended to replace advice given to you by your health care provider. Make sure you discuss any questions you have with your health care provider.   Document Released: 11/24/2004 Document Revised: 11/07/2014 Document Reviewed: 04/12/2013 Elsevier Interactive Patient Education 2016 [employer].  Trichomoniasis Trichomoniasis is an infection caused by an organism called Trichomonas. The infection can affect both women and men. In women, the outer female genitalia and the vagina are affected. In men, the penis is mainly affected, but the prostate and other reproductive organs can also be involved. Trichomoniasis is a sexually transmitted infection (STI) and is most often passed to another person through sexual contact.  RISK FACTORS  Having unprotected sexual intercourse.  Having sexual intercourse with an infected partner. SIGNS AND SYMPTOMS  Symptoms of trichomoniasis in women include:  Abnormal gray-green frothy vaginal discharge.  Itching and irritation of the vagina.  Itching and irritation of the area outside the vagina. Symptoms of trichomoniasis in men include:   Penile discharge with or without pain.  Pain during urination. This results from inflammation of the urethra. DIAGNOSIS  Trichomoniasis may be found during a Pap test or physical exam. Your health care provider may use one of the  following methods to help diagnose this infection:  Testing the pH of the vagina with a test tape.  Using a vaginal swab test that checks for the Trichomonas organism. A test is available that provides results within a few minutes.  Examining a urine sample.  Testing vaginal secretions. Your health care provider may test you for other STIs, including HIV. TREATMENT   You may be given medicine to fight the infection. Women should inform their health care provider if they could be or are pregnant. Some medicines used to treat the infection should not be taken during pregnancy.  Your health care provider may recommend over-the-counter medicines or creams to decrease itching or irritation.  Your sexual partner will need to be treated if infected.  Your health care provider may test you for infection again 3 months after treatment. HOME CARE INSTRUCTIONS   Take medicines  only as directed by your health care provider.  Take over-the-counter medicine for itching or irritation as directed by your health care provider.  Do not have sexual intercourse while you have the infection.  Women should not douche or wear tampons while they have the infection.  Discuss your infection with your partner. Your partner may have gotten the infection from you, or you may have gotten it from your partner.  Have your sex partner get examined and treated if necessary.  Practice safe, informed, and protected sex.  See your health care provider for other STI testing. SEEK MEDICAL CARE IF:   You still have symptoms after you finish your medicine.  You develop abdominal pain.  You have pain when you urinate.  You have bleeding after sexual intercourse.  You develop a rash.  Your medicine makes you sick or makes you throw up (vomit). MAKE SURE YOU:  Understand these instructions.  Will watch your condition.  Will get help right away if you are not doing well or get worse.   This information is not intended to replace advice given to you by your health care provider. Make sure you discuss any questions you have with your health care provider.   Document Released: 04/12/2001 Document Revised: 11/07/2014 Document Reviewed: 07/29/2013 Elsevier Interactive Patient Education 2016 Elsevier Inc.  Urinary Tract Infection A urinary tract infection (UTI) can occur any place along the urinary tract. The tract includes the kidneys, ureters, bladder, and urethra. A type of germ called bacteria often causes a UTI. UTIs are often helped with antibiotic medicine.  HOME CARE   If given, take antibiotics as told by your doctor. Finish them even if you start to feel better.  Drink enough fluids to keep your pee (urine) clear or pale yellow.  Avoid tea, drinks with caffeine, and bubbly (carbonated) drinks.  Pee often. Avoid holding your pee in for a long time.  Pee before and  after having sex (intercourse).  Wipe from front to back after you poop (bowel movement) if you are a woman. Use each tissue only once. GET HELP RIGHT AWAY IF:   You have back pain.  You have lower belly (abdominal) pain.  You have chills.  You feel sick to your stomach (nauseous).  You throw up (vomit).  Your burning or discomfort with peeing does not go away.  You have a fever.  Your symptoms are not better in 3 days. MAKE SURE YOU:   Understand these instructions.  Will watch your condition.  Will get help right away if you are not doing well or get worse.   This  information is not intended to replace advice given to you by your health care provider. Make sure you discuss any questions you have with your health care provider.   Document Released: 04/04/2008 Document Revised: 11/07/2014 Document Reviewed: 05/17/2012 Elsevier Interactive Patient Education [employer]

## 2015-10-01 NOTE — ED Notes (Signed)
States started Monday with Migraine headache. Headache also Tuesday. Also abdominal pain under umbilical area. Type 1 DM and sugars been "running over 200" States + urinary frequency, burning with voiding and low pelvic pain and "beige" vaginal discharge.

## 2015-10-01 NOTE — ED Provider Notes (Signed)
[facility]  ____________________________________________  Time seen: Approximately 5:41 PM  I have reviewed the triage vital signs and the nursing notes.   HISTORY  Chief Complaint Pelvic Pain   HPI Jody Chavez is a 34 y.o. femalepresents with complaints of 2-3 days of urinary frequency and burning with urination. States that she feels that she has to urinate constantly and doesn't necessarily void large amounts of urine. Patient also reports in the last day or so noticed some intermittent greenish to brownish vaginal discharge. Denies vaginal bleeding. Denies vaginal itching or vaginal pain. States occasionally with some suprapubic pelvic discomfort. Denies fall or trauma. Denies fevers. Reports continues to eat and drink well.  Patient reports that she is a type I diabetic. Patient reports that blood sugars have been ranging from 100-200 and states that this is fairly consistent with her baseline. Denies medication changes. Reports that she is adhering to her current medication medication regimen.  Denies nausea, vomiting, diarrhea, back pain. Denies headache. Denies chest pain, shortness of breath, palpitations or weakness. Patient reports that she is currently sexually active with 2 sexual partners. States these to have been her same sexual partners for many months. Denies current concern for sexually transmitted diseases. Patient reports that she has not been using sexual protection and states that "I'm okay if I get pregnant ". Patient reports pregnancy history as G3 P1 A2.   Primary care physician: Dr. Phineas Douglas and [facility].  LMP:2-3 weeks ago    Past Medical History  Diagnosis Date  . Hypertension   . Diabetes mellitus without complication (HCC)   . IBS (irritable bowel syndrome)   . Wolff-Parkinson-White (WPW) syndrome   . Anxiety   . Depressed   Migraines  There are no active problems to display for this patient.   Past Surgical History  Procedure  Laterality Date  . Cholecystectomy    . Heart ablation    . Abdominal surgery    . Cardiac electrophysiology mapping and ablation    . Endometrial ablation    . Cesarean section      Current Outpatient Rx  Name  Route  Sig  Dispense  Refill  . amLODipine (NORVASC) 10 MG tablet   Oral   Take 10 mg by mouth daily.         . insulin aspart (NOVOLOG) 100 UNIT/ML injection   Subcutaneous   Inject into the skin 3 (three) times daily before meals.         . insulin detemir (LEVEMIR) 100 UNIT/ML injection   Subcutaneous   Inject 26 Units into the skin at bedtime.         Marland Kitchen lisinopril (PRINIVIL,ZESTRIL) 10 MG tablet   Oral   Take 40 mg by mouth daily.          . sertraline (ZOLOFT) 50 MG tablet   Oral   Take 50 mg by mouth daily.         .           .           .           .           .             Allergies Iodine; Shellfish allergy; and Penicillins  Family History  Problem Relation Age of Onset  . Gallbladder disease Mother     Social History Social History  Substance Use Topics  . Smoking status: Former  Smoker  . Smokeless tobacco: Never Used  . Alcohol Use: Yes     Comment: socially    Review of Systems Constitutional: No fever/chills Eyes: No visual changes. ENT: No sore throat. Cardiovascular: Denies chest pain. Respiratory: Denies shortness of breath. Gastrointestinal: No abdominal pain.  No nausea, no vomiting.  No diarrhea.  No constipation. Genitourinary:Positive for dysuria.Positive for vaginal discharge.  Musculoskeletal: Negative for back pain. Skin: Negative for rash. Neurological: Negative for headaches, focal weakness or numbness.  10-point ROS otherwise negative.  ____________________________________________   PHYSICAL EXAM:  VITAL SIGNS: ED Triage Vitals  Enc Vitals Group     BP 10/01/15 1714 144/105 mmHg     Pulse Rate 10/01/15 1714 88     Resp 10/01/15 1714 16     Temp 10/01/15 1714 96.1 F (35.6 C)     Temp Source  10/01/15 1714 Tympanic     SpO2 10/01/15 1714 100 %     Weight 10/01/15 1714 175 lb (79.379 kg)     Height 10/01/15 1714  (1.6 m)     Head Cir --      Peak Flow --      Pain Score 10/01/15 1717 6     Pain Loc --      Pain Edu? --      Excl. in GC? --    Today's Vitals   10/01/15 1714 10/01/15 1717 10/01/15 1930 10/01/15 2010  BP: 144/105  145/108   Pulse: 88  95   Temp: 96.1 F (35.6 C)  96.9 F (36.1 C)   TempSrc: Tympanic  Tympanic   Resp: 16  98   Height:  (1.6 m)     Weight: 175 lb (79.379 kg)     SpO2: 100%     PainSc:  Patient reports that she has not taken her daily blood pressure medicines yet today. Reports that she will take them as soon as she returns home.  Constitutional: Alert and oriented. Well appearing and in no acute distress. Eyes: Conjunctivae are normal. PERRL. EOMI. Head: Atraumatic.  Nose: No congestion/rhinnorhea.  Mouth/Throat: Mucous membranes are moist.  Oropharynx non-erythematous. No tonsillar swelling or exudate.  Neck: No stridor.  No cervical spine tenderness to palpation. Hematological/Lymphatic/Immunilogical: No cervical lymphadenopathy. Cardiovascular: Normal rate, regular rhythm. Grossly normal heart sounds.  Good peripheral circulation. Respiratory: Normal respiratory effort.  No retractions. Lungs CTAB.No wheezes, rales or rhonchi.  Gastrointestinal:  minimal suprapubic tenderness to palpation otherwise abdomen Soft and nontender. No distention. Normal Bowel sounds.   No CVA tenderness.Changes position from lying to sitting upright quickly without use of supportive extremities.  Pelvic: completed with Beryle Quant RN at bedside.  External: normal appearance, no rash or lesions. Speculum: cervical os closed, no bleeding or blood in vaginal vault, mild to mod whitish discharge. Bimanual: no cervical or adnexal TTP, nontender.  Musculoskeletal: No lower or upper extremity tenderness nor edema.  No joint effusions. Bilateral  pedal pulses equal and easily palpated. No cervical, thoracic or lumbar tenderness to palpation. External insulin pump attached at left upper leg, no erythema or tenderness at site.  Neurologic:  Normal speech and language. No gross focal neurologic deficits are appreciated. No gait instability. Skin:  Skin is warm, dry and intact. No rash noted. Psychiatric: Mood and affect are normal. Speech and behavior are normal.  ____________________________________________   LABS (all labs ordered are listed, but only abnormal results are displayed)  Labs Reviewed  WET PREP, GENITAL - Abnormal; Notable for the following:    Trich, Wet Prep MODERATE (*)    Clue Cells Wet Prep HPF POC MODERATE (*)    WBC, Wet Prep HPF POC FEW (*)    All other components within normal limits  URINALYSIS COMPLETEWITH MICROSCOPIC ([facility] ONLY) - Abnormal; Notable for the following:    APPearance CLOUDY (*)    Specific Gravity, Urine >1.030 (*)    Hgb urine dipstick TRACE (*)    Protein, ur PRESENT (*)    Nitrite POSITIVE (*)    Leukocytes, UA 3+ (*)    Bacteria, UA MANY (*)    Squamous Epithelial / LPF 6-30 (*)    All other components within normal limits  CBC WITH DIFFERENTIAL/PLATELET - Abnormal; Notable for the following:    RDW 14.6 (*)    All other components within normal limits  BASIC METABOLIC PANEL - Abnormal; Notable for the following:    Potassium 3.0 (*)    Chloride 99 (*)    Creatinine, Ser 1.02 (*)    All other components within normal limits  URINE CULTURE  CHLAMYDIA/NGC RT PCR ([facility] ONLY)  PREGNANCY, URINE  HCG, QUANTITATIVE, PREGNANCY     INITIAL IMPRESSION / ASSESSMENT AND PLAN / ED COURSE  Pertinent labs & imaging results that were available during my care of the patient were reviewed by me and considered in my medical decision making (see chart for details).  Well-appearing patient. No acute distress. Presents for the complaints of 2-3 days of urinary frequency and burning  with urination as well as some intermittent vaginal discharge. Abdomen minimal suprapubic tenderness otherwise abdomen soft and nontender. Afebrile. Tolerating oral food and fluids well. No acute distress. Urinalysis positive for many bacteria, 3+ leukocytes, positive nitrite, will culture. Urine pregnancy negative. Will also evaluate labs as well as wet prep and chlamydia. Reports has not taken her daily blood pressure medications yet, but will take once she returns home. Denies chest pain, shortness of breath, dizziness, vision changes, current headache or other changes.   WBC serum 8.4. Otherwise labs reviewed. Potassium 3, 40 mEq potassium orally given. Patient reports that she has had a history of lower potassium in the past. Patient directed to closely follow with primary care physician in the next 2-3 days for follow-up and repeat potassium levels.  Wet prep moderate amount of trichomonas as well as clue cells. Will treat with oral Flagyl twice a day for 7 days. Discussed and encouraged pelvic rest including no sexual activity until completion of medication regimen as well as follow-up to ensure resolution.Will treat uti with oral macrobid. And culture urine. Counseled that all partners need to be treated. Discussed safe sex practices. Pelvic rest and follow up to ensure resolution prior to resuming sexual activity.   Discussed follow up with Primary care physician this week. Discussed follow up and return to Urgent care or proceed to ER including abdominal pain, weakness, dizziness, fever, uncontrolled blood sugars, no resolution or any worsening concerns. Patient verbalized understanding and agreed to plan.   ____________________________________________   FINAL CLINICAL IMPRESSION(S) / ED DIAGNOSES  Final diagnoses:  UTI (lower urinary tract infection)  Bacterial vaginosis  Trichomonas vaginitis  Hypokalemia       Renford DillsLindsey Darielle Hancher, NP 10/01/15 2029

## 2015-10-02 LAB — CHLAMYDIA/NGC RT PCR (ARMC ONLY)
Chlamydia Tr: NOT DETECTED
N GONORRHOEAE: NOT DETECTED

## 2015-10-04 LAB — URINE CULTURE: Culture: >=100000

## 2015-11-20 DIAGNOSIS — R1310 Dysphagia, unspecified: Secondary | ICD-10-CM | POA: Insufficient documentation

## 2015-12-27 ENCOUNTER — Ambulatory Visit
Admission: EM | Admit: 2015-12-27 | Discharge: 2015-12-27 | Disposition: A | Discharge status: Home / Self Care | Payer: 59 | Attending: Family Medicine | Admitting: Family Medicine

## 2015-12-27 ENCOUNTER — Encounter: Payer: Self-pay | Admitting: *Deleted

## 2015-12-27 DIAGNOSIS — H109 Unspecified conjunctivitis: Secondary | ICD-10-CM

## 2015-12-27 HISTORY — DX: Supraventricular tachycardia, unspecified: I47.10

## 2015-12-27 HISTORY — DX: Supraventricular tachycardia: I47.1

## 2015-12-27 MED ORDER — MOXIFLOXACIN HCL 0.5 % OP SOLN: 1.0000 [drp] | Freq: Three times a day (TID) | OPHTHALMIC | Status: DC

## 2015-12-27 NOTE — ED Notes (Signed)
Bilateral eye pain since yesterday, eyes appear red, watery, and swollen.

## 2015-12-27 NOTE — ED Provider Notes (Signed)
CSN: [id number]     Arrival date & time 12/27/15  1358 History   First MD Initiated Contact with Patient 12/27/15 1639     Chief Complaint  Patient presents with  . Eye Pain  . Facial Swelling   (Consider location/radiation/quality/duration/timing/severity/associated sxs/prior Treatment) Patient is a 35 y.o. female presenting with conjunctivitis. The history is provided by the patient.  Conjunctivitis This is a new problem. The current episode started 12 to 24 hours ago. The problem occurs constantly. Pertinent negatives include no shortness of breath.    Past Medical History  Diagnosis Date  . Hypertension   . Diabetes mellitus without complication (HCC)   . IBS (irritable bowel syndrome)   . Wolff-Parkinson-White (WPW) syndrome   . Anxiety   . Depressed   . Paroxysmal SVT (supraventricular tachycardia) (HCC) WPW   Past Surgical History  Procedure Laterality Date  . Cholecystectomy    . Heart ablation    . Abdominal surgery    . Cardiac electrophysiology mapping and ablation    . Endometrial ablation    . Cesarean section     Family History  Problem Relation Age of Onset  . Gallbladder disease Mother    Social History  Substance Use Topics  . Smoking status: Former Games developer  . Smokeless tobacco: Never Used  . Alcohol Use: Yes     Comment: socially   OB History    No data available     Review of Systems  Respiratory: Negative for shortness of breath.     Allergies  Iodine; Shellfish allergy; and Penicillins  Home Medications   Prior to Admission medications   Medication Sig Start Date End Date Taking? Authorizing Provider  amLODipine (NORVASC) 10 MG tablet Take 10 mg by mouth daily.   Yes Historical Provider, MD  insulin aspart (NOVOLOG) 100 UNIT/ML injection Inject into the skin 3 (three) times daily before meals.   Yes Historical Provider, MD  insulin detemir (LEVEMIR) 100 UNIT/ML injection Inject 26 Units into the skin at bedtime.   Yes Historical  Provider, MD  lisinopril (PRINIVIL,ZESTRIL) 10 MG tablet Take 40 mg by mouth daily.    Yes Historical Provider, MD  meloxicam (MOBIC) 15 MG tablet Take 1 tablet (15 mg total) by mouth daily. 07/16/15  Yes Hassan Rowan, MD  orphenadrine (NORFLEX) 100 MG tablet Take 1 tablet (100 mg total) by mouth 2 (two) times daily. 07/16/15  Yes Hassan Rowan, MD  sertraline (ZOLOFT) 50 MG tablet Take 50 mg by mouth daily.   Yes Historical Provider, MD  magnesium citrate SOLN Take 296 mLs (1 Bottle total) by mouth once. 04/14/15   Chinita Pester, FNP  metroNIDAZOLE (FLAGYL) 500 MG tablet Take 1 tablet (500 mg total) by mouth 2 (two) times daily. 10/01/15   Renford Dills, NP  moxifloxacin (VIGAMOX) 0.5 % ophthalmic solution Place 1 drop into both eyes 3 (three) times daily. X 5 days 12/27/15   Payton Mccallum, MD  nitrofurantoin, macrocrystal-monohydrate, (MACROBID) 100 MG capsule Take 1 capsule (100 mg total) by mouth 2 (two) times daily. 10/01/15   Renford Dills, NP  ondansetron (ZOFRAN) 4 MG tablet Take 1 tablet (4 mg total) by mouth every 8 (eight) hours as needed for nausea or vomiting. 04/14/15   Chinita Pester, FNP  predniSONE (DELTASONE) 10 MG tablet Take 60 mg x 2 days, then 50 mg x 2 days, then 40 mg x 2 days, then 30 mg x 2 days, then 20 mg x 2 days, then 10 mg  x 2 days 05/12/15   Carlean Purl, PA-C   Meds Ordered and Administered this Visit  Medications - No data to display  BP 136/100 mmHg  Pulse 90  Temp(Src) 98.1 F (36.7 C) (Oral)  Resp 16  Ht  (1.6 m)  Wt 176 lb (79.833 kg)  BMI 31.18 kg/m2  SpO2 100%  LMP 12/14/2015 (Approximate) No data found.   Physical Exam  Constitutional: She appears well-developed and well-nourished. No distress.  Eyes: EOM are normal. Pupils are equal, round, and reactive to light. Lids are everted and swept, no foreign bodies found. Right eye exhibits discharge. Left eye exhibits discharge. Right conjunctiva is injected. Left conjunctiva is injected.  Neck:  Normal range of motion. Neck supple. No thyromegaly present.  Skin: She is not diaphoretic.  Nursing note and vitals reviewed.   ED Course  Procedures (including critical care time)  Labs Review Labs Reviewed - No data to display  Imaging Review No results found.   Visual Acuity Review  Right Eye Distance: 20/15 Left Eye Distance: 20/15 Bilateral Distance: 20/13  Right Eye Near:   Left Eye Near:    Bilateral Near:         MDM   1. Bilateral conjunctivitis     Discharge Medication List as of 12/27/2015  4:49 PM    START taking these medications   Details  moxifloxacin (VIGAMOX) 0.5 % ophthalmic solution Place 1 drop into both eyes 3 (three) times daily. X 5 days, Starting 12/27/2015, Until Discontinued, Normal       1. diagnosis reviewed with patient 2. rx as per orders above; reviewed possible side effects, interactions, risks and benefits  3. Recommend supportive treatment with cool compresses 4. Follow-up prn if symptoms worsen or don't improve    Payton Mccallum, MD 12/27/15 [phone]

## 2016-02-03 ENCOUNTER — Other Ambulatory Visit: Payer: Self-pay

## 2016-02-03 ENCOUNTER — Emergency Department
Admission: EM | Admit: 2016-02-03 | Discharge: 2016-02-03 | Disposition: A | Discharge status: Home / Self Care | Payer: 59 | Attending: Emergency Medicine | Admitting: Emergency Medicine

## 2016-02-03 ENCOUNTER — Encounter: Payer: Self-pay | Admitting: Medical Oncology

## 2016-02-03 ENCOUNTER — Emergency Department: Payer: 59

## 2016-02-03 DIAGNOSIS — F329 Major depressive disorder, single episode, unspecified: Secondary | ICD-10-CM | POA: Insufficient documentation

## 2016-02-03 DIAGNOSIS — Z794 Long term (current) use of insulin: Secondary | ICD-10-CM | POA: Diagnosis not present

## 2016-02-03 DIAGNOSIS — E119 Type 2 diabetes mellitus without complications: Secondary | ICD-10-CM | POA: Diagnosis not present

## 2016-02-03 DIAGNOSIS — Z79899 Other long term (current) drug therapy: Secondary | ICD-10-CM | POA: Diagnosis not present

## 2016-02-03 DIAGNOSIS — Z7984 Long term (current) use of oral hypoglycemic drugs: Secondary | ICD-10-CM | POA: Diagnosis not present

## 2016-02-03 DIAGNOSIS — R079 Chest pain, unspecified: Secondary | ICD-10-CM | POA: Diagnosis not present

## 2016-02-03 DIAGNOSIS — I10 Essential (primary) hypertension: Secondary | ICD-10-CM | POA: Diagnosis not present

## 2016-02-03 DIAGNOSIS — Z87891 Personal history of nicotine dependence: Secondary | ICD-10-CM | POA: Diagnosis not present

## 2016-02-03 LAB — BASIC METABOLIC PANEL
ANION GAP: 8 (ref 5–15)
BUN: 15 mg/dL (ref 6–20)
CALCIUM: 9 mg/dL (ref 8.9–10.3)
CO2: 22 mmol/L (ref 22–32)
Chloride: 105 mmol/L (ref 101–111)
Creatinine, Ser: 0.8 mg/dL (ref 0.44–1.00)
GFR calc Af Amer: >60 mL/min (ref >60)
GFR calc non Af Amer: >60 mL/min (ref >60)
GLUCOSE: 168 mg/dL — AB (ref 65–99)
Potassium: 3.5 mmol/L (ref 3.5–5.1)
Sodium: 135 mmol/L (ref 135–145)

## 2016-02-03 LAB — CBC
HCT: 39 % (ref 35.0–47.0)
HEMOGLOBIN: 12.9 g/dL (ref 12.0–16.0)
MCH: 28.7 pg (ref 26.0–34.0)
MCHC: 33 g/dL (ref 32.0–36.0)
MCV: 87.1 fL (ref 80.0–100.0)
Platelets: 211 10*3/uL (ref 150–440)
RBC: 4.47 MIL/uL (ref 3.80–5.20)
RDW: 15.7 % — ABNORMAL HIGH (ref 11.5–14.5)
WBC: 6.6 10*3/uL (ref 3.6–11.0)

## 2016-02-03 LAB — TROPONIN I: Troponin I: <0.03 ng/mL (ref <0.031)

## 2016-02-03 MED ORDER — IBUPROFEN 600 MG PO TABS: 600.0000 mg | ORAL_TABLET | Freq: Three times a day (TID) | ORAL | Status: DC | PRN

## 2016-02-03 NOTE — ED Notes (Signed)
Pt reports she began having a heaviness to the center of her chest about 25 min pta. Pt also reports that she was having tingling in her left arm. Tearful in triage.

## 2016-02-03 NOTE — ED Provider Notes (Signed)
[facility] Emergency Department Provider Note    ____________________________________________  Time seen: ~1520  I have reviewed the triage vital signs and the nursing notes.   HISTORY  Chief Complaint Chest Pain   History limited by: Not Limited   HPI Jody Chavez is a 35 y.o. female who presents to the emergency department today because of concern for chest pain. It started this morning. It started when she was sitting on the couch. She describes it as being located in the center of her chest. She states that it was sharp in quality however then became a heaviness. She denies any radiation. She denies any associated shortness of breath. She states she is still having some dull pain. It is mild this time. Denies any recent cough. States she has had similar pain in the past when she has had anxiety issues.Denies any recent travel. Denies any leg swelling.    Past Medical History  Diagnosis Date  . Hypertension   . Diabetes mellitus without complication (HCC)   . IBS (irritable bowel syndrome)   . Wolff-Parkinson-White (WPW) syndrome   . Anxiety   . Depressed   . Paroxysmal SVT (supraventricular tachycardia) (HCC) WPW    There are no active problems to display for this patient.   Past Surgical History  Procedure Laterality Date  . Cholecystectomy    . Heart ablation    . Abdominal surgery    . Cardiac electrophysiology mapping and ablation    . Endometrial ablation    . Cesarean section      Current Outpatient Rx  Name  Route  Sig  Dispense  Refill  . amLODipine (NORVASC) 10 MG tablet   Oral   Take 10 mg by mouth daily.         . insulin aspart (NOVOLOG) 100 UNIT/ML injection   Subcutaneous   Inject into the skin 3 (three) times daily before meals.         . insulin detemir (LEVEMIR) 100 UNIT/ML injection   Subcutaneous   Inject 26 Units into the skin at bedtime.         Marland Kitchen lisinopril (PRINIVIL,ZESTRIL) 10 MG tablet   Oral    Take 40 mg by mouth daily.          . magnesium citrate SOLN   Oral   Take 296 mLs (1 Bottle total) by mouth once.   195 mL   0   . meloxicam (MOBIC) 15 MG tablet   Oral   Take 1 tablet (15 mg total) by mouth daily.   30 tablet   1   . metroNIDAZOLE (FLAGYL) 500 MG tablet   Oral   Take 1 tablet (500 mg total) by mouth 2 (two) times daily.   14 tablet   0   . moxifloxacin (VIGAMOX) 0.5 % ophthalmic solution   Both Eyes   Place 1 drop into both eyes 3 (three) times daily. X 5 days   3 mL   0   . nitrofurantoin, macrocrystal-monohydrate, (MACROBID) 100 MG capsule   Oral   Take 1 capsule (100 mg total) by mouth 2 (two) times daily.   10 capsule   0   . ondansetron (ZOFRAN) 4 MG tablet   Oral   Take 1 tablet (4 mg total) by mouth every 8 (eight) hours as needed for nausea or vomiting.   20 tablet   0   . orphenadrine (NORFLEX) 100 MG tablet   Oral   Take 1 tablet (  100 mg total) by mouth 2 (two) times daily.   60 tablet   0   . predniSONE (DELTASONE) 10 MG tablet      Take 60 mg x 2 days, then 50 mg x 2 days, then 40 mg x 2 days, then 30 mg x 2 days, then 20 mg x 2 days, then 10 mg x 2 days   42 tablet   0   . sertraline (ZOLOFT) 50 MG tablet   Oral   Take 50 mg by mouth daily.           Allergies Iodine; Shellfish allergy; and Penicillins  Family History  Problem Relation Age of Onset  . Gallbladder disease Mother     Social History Social History  Substance Use Topics  . Smoking status: Former Games developermoker  . Smokeless tobacco: Never Used  . Alcohol Use: Yes     Comment: socially    Review of Systems  Constitutional: Negative for fever. Cardiovascular: Positive for chest pain. Respiratory: Negative for shortness of breath. Gastrointestinal: Negative for abdominal pain, vomiting and diarrhea. Neurological: Negative for headaches, focal weakness or numbness.  10-point ROS otherwise  negative.  ____________________________________________   PHYSICAL EXAM:  VITAL SIGNS: ED Triage Vitals  Enc Vitals Group     BP 02/03/16 1057 138/92 mmHg     Pulse Rate 02/03/16 1057 108     Resp 02/03/16 1057 20     Temp 02/03/16 1057 98.5 F (36.9 C)     Temp Source 02/03/16 1057 Oral     SpO2 02/03/16 1057 99 %     Weight 02/03/16 1057 178 lb (80.74 kg)     Height 02/03/16 1057 5\' 3"  (1.6 m)     Head Cir --      Peak Flow --      Pain Score 02/03/16 1058 7     Pain Loc --      Pain Edu? --      Excl. in GC? --     Constitutional: Alert and oriented. Well appearing and in no distress. Eyes: Conjunctivae are normal. PERRL. Normal extraocular movements. ENT   Head: Normocephalic and atraumatic.   Nose: No congestion/rhinnorhea.   Mouth/Throat: Mucous membranes are moist.   Neck: No stridor. Hematological/Lymphatic/Immunilogical: No cervical lymphadenopathy. Cardiovascular: Normal rate, regular rhythm.  No murmurs, rubs, or gallops. Some tenderness to palpation of the sternum, which the patient states reproduces the pain she was experiencing.  Respiratory: Normal respiratory effort without tachypnea nor retractions. Breath sounds are clear and equal bilaterally. No wheezes/rales/rhonchi. Gastrointestinal: Soft and nontender. No distention. There is no CVA tenderness. Genitourinary: Deferred Musculoskeletal: Normal range of motion in all extremities. No joint effusions.  No lower extremity tenderness nor edema. Neurologic:  Normal speech and language. No gross focal neurologic deficits are appreciated.  Skin:  Skin is warm, dry and intact. No rash noted. Psychiatric: Mood and affect are normal. Speech and behavior are normal. Patient exhibits appropriate insight and judgment.  ____________________________________________    LABS (pertinent positives/negatives)  Labs Reviewed  BASIC METABOLIC PANEL - Abnormal; Notable for the following:    Glucose, Bld 168  (*)    All other components within normal limits  CBC - Abnormal; Notable for the following:    RDW 15.7 (*)    All other components within normal limits  TROPONIN I  TROPONIN I     ____________________________________________   EKG  I, Phineas SemenGraydon Strummer Canipe, attending physician, personally viewed and interpreted this EKG  EKG  Time: 1053 Rate: 108 Rhythm: sinus tachycardia Axis: normal Intervals: qtc 450 QRS: narrow ST changes: no st elevation Impression: abnormal ekg   ____________________________________________    RADIOLOGY  CXR IMPRESSION: No active cardiopulmonary disease.  ____________________________________________   PROCEDURES  Procedure(s) performed: None  Critical Care performed: No  ____________________________________________   INITIAL IMPRESSION / ASSESSMENT AND PLAN / ED COURSE  Pertinent labs & imaging results that were available during my care of the patient were reviewed by me and considered in my medical decision making (see chart for details).  Patient presented to the emergency department today because of concerns for chest pain. On exam patient was tender to palpation of the sternum. This did reproduce the pain. I think costochondritis likely. I did however get 2 sets of troponin which were both negative. Additionally EKG without concerning findings. Will discharge home. Will give cardiology follow-up.  ____________________________________________   FINAL CLINICAL IMPRESSION(S) / ED DIAGNOSES  Final diagnoses:  Chest pain, unspecified chest pain type     Phineas Semen, MD 02/03/16 1806

## 2016-02-03 NOTE — Discharge Instructions (Signed)
Please seek medical attention for any high fevers, chest pain, shortness of breath, change in behavior, persistent vomiting, bloody stool or any other new or concerning symptoms. ° ° °Nonspecific Chest Pain °It is often hard to find the cause of chest pain. There is always a chance that your pain could be related to something serious, such as a heart attack or a blood clot in your lungs. Chest pain can also be caused by conditions that are not life-threatening. If you have chest pain, it is very important to follow up with your doctor. ° °HOME CARE °· If you were prescribed an antibiotic medicine, finish it all even if you start to feel better. °· Avoid any activities that cause chest pain. °· Do not use any tobacco products, including cigarettes, chewing tobacco, or electronic cigarettes. If you need help quitting, ask your doctor. °· Do not drink alcohol. °· Take medicines only as told by your doctor. °· Keep all follow-up visits as told by your doctor. This is important. This includes any further testing if your chest pain does not go away. °· Your doctor may tell you to keep your head raised (elevated) while you sleep. °· Make lifestyle changes as told by your doctor. These may include: °¨ Getting regular exercise. Ask your doctor to suggest some activities that are safe for you. °¨ Eating a heart-healthy diet. Your doctor or a diet specialist (dietitian) can help you to learn healthy eating options. °¨ Maintaining a healthy weight. °¨ Managing diabetes, if necessary. °¨ Reducing stress. °GET HELP IF: °· Your chest pain does not go away, even after treatment. °· You have a rash with blisters on your chest. °· You have a fever. °GET HELP RIGHT AWAY IF: °· Your chest pain is worse. °· You have an increasing cough, or you cough up blood. °· You have severe belly (abdominal) pain. °· You feel extremely weak. °· You pass out (faint). °· You have chills. °· You have sudden, unexplained chest discomfort. °· You have  sudden, unexplained discomfort in your arms, back, neck, or jaw. °· You have shortness of breath at any time. °· You suddenly start to sweat, or your skin gets clammy. °· You feel nauseous. °· You vomit. °· You suddenly feel light-headed or dizzy. °· Your heart begins to beat quickly, or it feels like it is skipping beats. °These symptoms may be an emergency. Do not wait to see if the symptoms will go away. Get medical help right away. Call your local emergency services (911 in the [place] Do not drive yourself to the hospital. °  °This information is not intended to replace advice given to you by your health care provider. Make sure you discuss any questions you have with your health care provider. °  °Document Released: 04/04/2008 Document Revised: 11/07/2014 Document Reviewed: 05/23/2014 °Elsevier Interactive Patient Education ©2016 [employer] ° °

## 2016-03-03 ENCOUNTER — Ambulatory Visit
Admission: RE | Admit: 2016-03-03 | Discharge: 2016-03-03 | Disposition: A | Discharge status: Home / Self Care | Payer: 59 | Source: Ambulatory Visit | Attending: Obstetrics and Gynecology | Admitting: Obstetrics and Gynecology

## 2016-03-03 ENCOUNTER — Other Ambulatory Visit: Payer: Self-pay | Admitting: Obstetrics and Gynecology

## 2016-03-03 DIAGNOSIS — O209 Hemorrhage in early pregnancy, unspecified: Secondary | ICD-10-CM

## 2016-03-03 DIAGNOSIS — Z3490 Encounter for supervision of normal pregnancy, unspecified, unspecified trimester: Secondary | ICD-10-CM

## 2016-03-04 ENCOUNTER — Encounter
Admission: EM | Disposition: A | Discharge status: Home / Self Care | Payer: Self-pay | Source: Home / Self Care | Attending: Emergency Medicine

## 2016-03-04 ENCOUNTER — Emergency Department: Payer: 59 | Admitting: Registered Nurse

## 2016-03-04 ENCOUNTER — Emergency Department
Admission: EM | Admit: 2016-03-04 | Discharge: 2016-03-04 | Disposition: A | Discharge status: Home / Self Care | Payer: 59 | Attending: Emergency Medicine | Admitting: Emergency Medicine

## 2016-03-04 ENCOUNTER — Ambulatory Visit: Admit: 2016-03-04 | Payer: Self-pay | Admitting: Obstetrics and Gynecology

## 2016-03-04 DIAGNOSIS — F329 Major depressive disorder, single episode, unspecified: Secondary | ICD-10-CM | POA: Diagnosis not present

## 2016-03-04 DIAGNOSIS — I456 Pre-excitation syndrome: Secondary | ICD-10-CM | POA: Diagnosis not present

## 2016-03-04 DIAGNOSIS — O9989 Other specified diseases and conditions complicating pregnancy, childbirth and the puerperium: Secondary | ICD-10-CM | POA: Insufficient documentation

## 2016-03-04 DIAGNOSIS — O001 Tubal pregnancy without intrauterine pregnancy: Secondary | ICD-10-CM | POA: Diagnosis not present

## 2016-03-04 DIAGNOSIS — O169 Unspecified maternal hypertension, unspecified trimester: Secondary | ICD-10-CM | POA: Insufficient documentation

## 2016-03-04 DIAGNOSIS — O24919 Unspecified diabetes mellitus in pregnancy, unspecified trimester: Secondary | ICD-10-CM | POA: Insufficient documentation

## 2016-03-04 DIAGNOSIS — I471 Supraventricular tachycardia: Secondary | ICD-10-CM | POA: Insufficient documentation

## 2016-03-04 DIAGNOSIS — Z794 Long term (current) use of insulin: Secondary | ICD-10-CM | POA: Insufficient documentation

## 2016-03-04 DIAGNOSIS — O99419 Diseases of the circulatory system complicating pregnancy, unspecified trimester: Secondary | ICD-10-CM | POA: Diagnosis not present

## 2016-03-04 DIAGNOSIS — O99340 Other mental disorders complicating pregnancy, unspecified trimester: Secondary | ICD-10-CM | POA: Diagnosis not present

## 2016-03-04 DIAGNOSIS — N736 Female pelvic peritoneal adhesions (postinfective): Secondary | ICD-10-CM | POA: Diagnosis not present

## 2016-03-04 DIAGNOSIS — Z3A00 Weeks of gestation of pregnancy not specified: Secondary | ICD-10-CM | POA: Diagnosis not present

## 2016-03-04 DIAGNOSIS — O99619 Diseases of the digestive system complicating pregnancy, unspecified trimester: Secondary | ICD-10-CM | POA: Diagnosis not present

## 2016-03-04 DIAGNOSIS — Z87891 Personal history of nicotine dependence: Secondary | ICD-10-CM | POA: Insufficient documentation

## 2016-03-04 DIAGNOSIS — F419 Anxiety disorder, unspecified: Secondary | ICD-10-CM | POA: Diagnosis not present

## 2016-03-04 DIAGNOSIS — O009 Unspecified ectopic pregnancy without intrauterine pregnancy: Secondary | ICD-10-CM

## 2016-03-04 DIAGNOSIS — K589 Irritable bowel syndrome without diarrhea: Secondary | ICD-10-CM | POA: Diagnosis not present

## 2016-03-04 HISTORY — PX: DIAGNOSTIC LAPAROSCOPY WITH REMOVAL OF ECTOPIC PREGNANCY: SHX6449

## 2016-03-04 LAB — URINALYSIS COMPLETE WITH MICROSCOPIC (ARMC ONLY)
Bacteria, UA: NONE SEEN
Bilirubin Urine: NEGATIVE
Glucose, UA: >500 mg/dL — AB
KETONES UR: NEGATIVE mg/dL
Leukocytes, UA: NEGATIVE
Nitrite: NEGATIVE
PH: 6 (ref 5.0–8.0)
PROTEIN: NEGATIVE mg/dL
Specific Gravity, Urine: 1.017 (ref 1.005–1.030)

## 2016-03-04 LAB — COMPREHENSIVE METABOLIC PANEL
ALBUMIN: 3.8 g/dL (ref 3.5–5.0)
ALK PHOS: 50 U/L (ref 38–126)
ALT: 13 U/L — AB (ref 14–54)
AST: 18 U/L (ref 15–41)
Anion gap: 6 (ref 5–15)
BILIRUBIN TOTAL: 0.6 mg/dL (ref 0.3–1.2)
BUN: 18 mg/dL (ref 6–20)
CO2: 25 mmol/L (ref 22–32)
Calcium: 9.2 mg/dL (ref 8.9–10.3)
Chloride: 105 mmol/L (ref 101–111)
Creatinine, Ser: 0.78 mg/dL (ref 0.44–1.00)
GFR calc Af Amer: >60 mL/min (ref >60)
GFR calc non Af Amer: >60 mL/min (ref >60)
GLUCOSE: 150 mg/dL — AB (ref 65–99)
POTASSIUM: 4 mmol/L (ref 3.5–5.1)
Sodium: 136 mmol/L (ref 135–145)
TOTAL PROTEIN: 6.7 g/dL (ref 6.5–8.1)

## 2016-03-04 LAB — CBC
HCT: 35.7 % (ref 35.0–47.0)
Hemoglobin: 11.9 g/dL — ABNORMAL LOW (ref 12.0–16.0)
MCH: 29.4 pg (ref 26.0–34.0)
MCHC: 33.4 g/dL (ref 32.0–36.0)
MCV: 88 fL (ref 80.0–100.0)
Platelets: 213 10*3/uL (ref 150–440)
RBC: 4.06 MIL/uL (ref 3.80–5.20)
RDW: 15.6 % — AB (ref 11.5–14.5)
WBC: 8.4 10*3/uL (ref 3.6–11.0)

## 2016-03-04 LAB — ABO/RH: ABO/RH(D): O POS

## 2016-03-04 LAB — GLUCOSE, CAPILLARY: Glucose-Capillary: 109 mg/dL — ABNORMAL HIGH (ref 65–99)

## 2016-03-04 LAB — TYPE AND SCREEN
ABO/RH(D): O POS
Antibody Screen: NEGATIVE

## 2016-03-04 LAB — HCG, QUANTITATIVE, PREGNANCY: hCG, Beta Chain, Quant, S: 152 m[IU]/mL — ABNORMAL HIGH (ref <5)

## 2016-03-04 SURGERY — LAPAROSCOPY, WITH ECTOPIC PREGNANCY SURGICAL TREATMENT
Anesthesia: General | Site: Abdomen | Laterality: Right | Wound class: Clean Contaminated

## 2016-03-04 MED ORDER — FENTANYL CITRATE (PF) 100 MCG/2ML IJ SOLN
INTRAMUSCULAR | Status: AC
  Filled 2016-03-04: qty 2

## 2016-03-04 MED ORDER — LACTATED RINGERS IV SOLN: INTRAVENOUS | Status: DC

## 2016-03-04 MED ORDER — DEXAMETHASONE SODIUM PHOSPHATE 10 MG/ML IJ SOLN
INTRAMUSCULAR | Status: DC | PRN
  Administered 2016-03-04: 5 mg via INTRAVENOUS

## 2016-03-04 MED ORDER — SUCCINYLCHOLINE CHLORIDE 20 MG/ML IJ SOLN
INTRAMUSCULAR | Status: DC | PRN
  Administered 2016-03-04: 20 mg via INTRAVENOUS
  Administered 2016-03-04: 100 mg via INTRAVENOUS

## 2016-03-04 MED ORDER — FENTANYL CITRATE (PF) 100 MCG/2ML IJ SOLN
25.0000 ug | INTRAMUSCULAR | Status: AC | PRN
  Administered 2016-03-04 (×6): 25 ug via INTRAVENOUS

## 2016-03-04 MED ORDER — SODIUM CHLORIDE 0.9 % IR SOLN
Status: DC | PRN
  Administered 2016-03-04: 200 mL

## 2016-03-04 MED ORDER — PROPOFOL 10 MG/ML IV BOLUS
INTRAVENOUS | Status: DC | PRN
  Administered 2016-03-04: 160 mg via INTRAVENOUS

## 2016-03-04 MED ORDER — MORPHINE SULFATE (PF) 2 MG/ML IV SOLN: 2.0000 mg | INTRAVENOUS | Status: DC | PRN

## 2016-03-04 MED ORDER — CITRIC ACID-SODIUM CITRATE 334-500 MG/5ML PO SOLN
30.0000 mL | ORAL | Status: DC
  Filled 2016-03-04: qty 30

## 2016-03-04 MED ORDER — LIDOCAINE HCL 2 % EX GEL
CUTANEOUS | Status: DC | PRN
  Administered 2016-03-04: 1 via TOPICAL

## 2016-03-04 MED ORDER — MIDAZOLAM HCL 2 MG/2ML IJ SOLN
INTRAMUSCULAR | Status: DC | PRN
  Administered 2016-03-04: 2 mg via INTRAVENOUS

## 2016-03-04 MED ORDER — ONDANSETRON HCL 4 MG/2ML IJ SOLN
INTRAMUSCULAR | Status: DC | PRN
  Administered 2016-03-04: 4 mg via INTRAVENOUS

## 2016-03-04 MED ORDER — FENTANYL CITRATE (PF) 100 MCG/2ML IJ SOLN
INTRAMUSCULAR | Status: DC | PRN
  Administered 2016-03-04 (×3): 50 ug via INTRAVENOUS

## 2016-03-04 MED ORDER — ROCURONIUM BROMIDE 100 MG/10ML IV SOLN
INTRAVENOUS | Status: DC | PRN
  Administered 2016-03-04: 35 mg via INTRAVENOUS

## 2016-03-04 MED ORDER — BUPIVACAINE HCL 0.5 % IJ SOLN
INTRAMUSCULAR | Status: DC | PRN
  Administered 2016-03-04: 9 mL

## 2016-03-04 MED ORDER — ONDANSETRON HCL 4 MG/2ML IJ SOLN: 4.0000 mg | Freq: Once | INTRAMUSCULAR | Status: DC | PRN

## 2016-03-04 MED ORDER — LIDOCAINE HCL (CARDIAC) 20 MG/ML IV SOLN
INTRAVENOUS | Status: DC | PRN
  Administered 2016-03-04: 100 mg via INTRAVENOUS

## 2016-03-04 MED ORDER — SUGAMMADEX SODIUM 200 MG/2ML IV SOLN
INTRAVENOUS | Status: DC | PRN
  Administered 2016-03-04: 175 mg via INTRAVENOUS

## 2016-03-04 MED ORDER — GLYCOPYRROLATE 0.2 MG/ML IJ SOLN
INTRAMUSCULAR | Status: DC | PRN
  Administered 2016-03-04: 0.2 mg via INTRAVENOUS

## 2016-03-04 MED ORDER — SODIUM CHLORIDE 0.9 % IV SOLN
INTRAVENOUS | Status: DC | PRN
  Administered 2016-03-04 (×2): via INTRAVENOUS

## 2016-03-04 SURGICAL SUPPLY — 38 items
BAG URO DRAIN 2000ML W/SPOUT (MISCELLANEOUS) IMPLANT
BLADE SURG SZ11 CARB STEEL (BLADE) ×3 IMPLANT
CANISTER SUCT 1200ML W/VALVE (MISCELLANEOUS) ×3 IMPLANT
CATH ROBINSON RED A/P 16FR (CATHETERS) ×3 IMPLANT
CHLORAPREP W/TINT 26ML (MISCELLANEOUS) ×3 IMPLANT
CLOSURE WOUND 1/4X4 (GAUZE/BANDAGES/DRESSINGS) ×1
DRSG TEGADERM 2-3/8X2-3/4 SM (GAUZE/BANDAGES/DRESSINGS) ×9 IMPLANT
DRSG TEGADERM 2X2.25 PEDS (GAUZE/BANDAGES/DRESSINGS) ×3 IMPLANT
ENDOPOUCH RETRIEVER 10 (MISCELLANEOUS) ×3 IMPLANT
GAUZE SPONGE NON-WVN 2X2 STRL (MISCELLANEOUS) ×3 IMPLANT
GLOVE BIO SURGEON STRL SZ8 (GLOVE) ×6 IMPLANT
GOWN STRL REUS W/ TWL LRG LVL3 (GOWN DISPOSABLE) ×2 IMPLANT
GOWN STRL REUS W/ TWL XL LVL3 (GOWN DISPOSABLE) ×1 IMPLANT
GOWN STRL REUS W/TWL LRG LVL3 (GOWN DISPOSABLE) ×4
GOWN STRL REUS W/TWL XL LVL3 (GOWN DISPOSABLE) ×2
GRASPER SUT TROCAR 14GX15 (MISCELLANEOUS) ×3 IMPLANT
IRRIGATION STRYKERFLOW (MISCELLANEOUS) ×1 IMPLANT
IRRIGATOR STRYKERFLOW (MISCELLANEOUS) ×3
IV NS 1000ML (IV SOLUTION) ×2
IV NS 1000ML BAXH (IV SOLUTION) ×1 IMPLANT
KIT RM TURNOVER CYSTO AR (KITS) ×3 IMPLANT
LABEL OR SOLS (LABEL) ×3 IMPLANT
LIQUID BAND (GAUZE/BANDAGES/DRESSINGS) ×3 IMPLANT
NS IRRIG 500ML POUR BTL (IV SOLUTION) ×3 IMPLANT
PACK GYN LAPAROSCOPIC (MISCELLANEOUS) ×3 IMPLANT
PAD OB MATERNITY 4.3X12.25 (PERSONAL CARE ITEMS) ×3 IMPLANT
PAD PREP 24X41 OB/GYN DISP (PERSONAL CARE ITEMS) ×3 IMPLANT
SHEARS HARMONIC ACE PLUS 36CM (ENDOMECHANICALS) ×3 IMPLANT
SPONGE VERSALON 2X2 STRL (MISCELLANEOUS) ×6
STRIP CLOSURE SKIN 1/4X4 (GAUZE/BANDAGES/DRESSINGS) ×2 IMPLANT
SUT VIC AB 2-0 UR6 27 (SUTURE) ×6 IMPLANT
SUT VIC AB 4-0 SH 27 (SUTURE) ×4
SUT VIC AB 4-0 SH 27XANBCTRL (SUTURE) ×2 IMPLANT
SWABSTK COMLB BENZOIN TINCTURE (MISCELLANEOUS) ×3 IMPLANT
TROCAR ENDO BLADELESS 11MM (ENDOMECHANICALS) ×3 IMPLANT
TROCAR XCEL NON-BLD 5MMX100MML (ENDOMECHANICALS) ×3 IMPLANT
TROCAR XCEL UNIV SLVE 11M 100M (ENDOMECHANICALS) ×3 IMPLANT
TUBING INSUFFLATOR HI FLOW (MISCELLANEOUS) ×3 IMPLANT

## 2016-03-04 NOTE — Discharge Instructions (Signed)

## 2016-03-04 NOTE — H&P (Signed)
Jody Chavez is a 35 y.o. female here for follow up for rt adnexal mass and abnormally rising B HCG . Quant rose from 113 to 121 on 48 hours ( abnormal rise) . Pt has a h/o of prior rt ectopic pregnancy that was treated with MTX in the past . U/S yesterday show a 3 cm mass. Inferior to the right ovary . Pt is without pelvic pain . BT : O+  Past Medical History:  has a past medical history of Abnormal EKG; Anxiety, unspecified; Depression, unspecified; Diabetes mellitus type I (CMS-HCC); Hypertension; IBS (irritable bowel syndrome); Palpitations; Panic attacks; and Wolf-Parkinson-White syndrome. right ectopic pregnancy  Past Surgical History:  has a past surgical history that includes Cardiac ablation (2012); Cholecystectomy (2005); Cesarean section (2001); and Capsule Endoscopy Gastrointestinal Tract (2012). Family History: family history includes Colon cancer in her maternal grandfather; Diabetes mellitus in her maternal grandfather and paternal grandmother; Hypertension in her maternal grandfather and maternal grandmother; No Known Problems in her brother, father, mother, and sister. Social History:  reports that she has quit smoking. She has never used smokeless tobacco. She reports that she drinks alcohol. She reports that she uses illicit drugs. OB/GYN History:  OB History    Gravida Para Term Preterm AB TAB SAB Ectopic Multiple Living   0 0 1      Obstetric Comments   Blood transfusion       Allergies: is allergic to others; penicillins; iodine; penicillin v potassium; and shellfish containing products. Medications:  Current Outpatient Prescriptions:  . acetone, urine, test (KETOSTIX) strip, Use 1 strip as needed for High Blood Sugar., Disp: 20 each, Rfl: 0 . amLODIPine (NORVASC) 10 MG tablet, Take 10 mg by mouth once daily., Disp: , Rfl:  . blood glucose diagnostic (CONTOUR NEXT STRIPS) test strip, Use 4 (four) times daily., Disp: 400 each, Rfl: 1 . doxycycline  (MONODOX) 100 MG capsule, Take 1 capsule (100 mg total) by mouth 2 (two) times daily., Disp: 28 capsule, Rfl: 0 . etodolac (LODINE) 500 MG tablet, Take 1 tablet (500 mg total) by mouth 2 (two) times daily., Disp: 60 tablet, Rfl: 2 . lisinopril (PRINIVIL,ZESTRIL) 40 MG tablet, TAKE 1 TABLET (40 MG TOTAL) BY MOUTH ONCE DAILY., Disp: 30 tablet, Rfl: 0 . metroNIDAZOLE (FLAGYL) 500 MG tablet, Take by mouth. Reported on 03/02/2016 , Disp: , Rfl:  . nitrofurantoin, macrocrystal-monohydrate, (MACROBID) 100 MG capsule, Take by mouth. Reported on 03/02/2016 , Disp: , Rfl:  . norethindrone (MICRONOR) 0.35 mg tablet, Take 1 tablet (0.35 mg total) by mouth once daily., Disp: 1 Package, Rfl: 11 . NOVOLOG 100 unit/mL injection, USE UPTO 60 UNITS DAILY VIA INSULIN PUMP, Disp: 20 mL, Rfl: 5 . sertraline (ZOLOFT) 25 MG tablet, Take 50 mg by mouth nightly. Reported on 10/28/2015 , Disp: , Rfl:   Review of Systems: General:   No fatigue or weight loss Eyes:   No vision changes Ears:   No hearing difficulty Respiratory:   No cough or shortness of breath Pulmonary:   No asthma or shortness of breath Cardiovascular:  No chest pain, palpitations, dyspnea on exertion Gastrointestinal:  No abdominal bloating, chronic diarrhea, constipations, masses, pain or hematochezia Genitourinary:  No hematuria, dysuria, abnormal vaginal discharge, pelvic pain, Menometrorrhagia Lymphatic:  No swollen lymph nodes Musculoskeletal: No muscle weakness Neurologic:  No extremity weakness, syncope, seizure disorder Psychiatric:  No history of depression, delusions or suicidal/homicidal ideation   Exam:   Vitals:   03/04/16 1332  BP: (!) 135/91  Pulse: 109    Body mass index is 32.59 kg/(m^2).  WDWN / black female in NAD  Lungs: CTA  CV : RRR without murmur   Neck: no thyromegaly Abdomen: soft , no mass, normal active bowel sounds, non-tender, no rebound tenderness Pelvic: tanner stage 5 ,  External genitalia: vulva  /labia no lesions Urethra: no prolapse Vagina: pink d/c Cervix: no lesions, no cervical motion tenderness  Uterus: normal size shape and contour, non-tender Adnexa: no mass, non-tender    Impression:   The encounter diagnosis was Tubal pregnancy without intrauterine pregnancy.  Right   Plan:   Detailed discussion with the pt regarding laparoscopic evaluation and probable right salpingectomy with ectopic .   Benefits and risks to surgery: The proposed benefit of the surgery has been discussed with the patient. The possible risks include, but are not limited to: organ injury to the bowel , bladder, ureters, and major blood vessels and nerves. There is a possibility of additional surgeries resulting from these injuries. There is also the risk of blood transfusion and the need to receive blood products during or after the procedure which may rarely lead to HIV or Hepatitis C infection. There is a risk of developing a deep venous thrombosis or a pulmonary embolism . There is the possibility of wound infection and also anesthetic complications, even the rare possibility of death. The patient understands these risks and wishes to proceed. All questions have been answered and the consent has been signed.    Electronically signed by Vilma Praderhomas Janse Schermerhorn, MD at 03/04/2016 2:15 PM      Pre-Evaluation on 03/04/2016

## 2016-03-04 NOTE — ED Notes (Addendum)
Pt checks CBG herself at triage,   168 CBG.

## 2016-03-04 NOTE — ED Provider Notes (Signed)
[facility] Emergency Department Provider Note  ____________________________________________    I have reviewed the triage vital signs and the nursing notes.   HISTORY  Chief Complaint Dehydration; Possible Pregnancy; and Vaginal Bleeding    HPI Jody Chavez is a 35 y.o. female who presents with lower abdominal pain. Patient was sent in by her gynecologist because apparently she has been diagnosed with likely ectopic pregnancy. She denies vaginal bleeding at this time. She complains of mild to moderate cramping pain in the lower abdomen. No fevers or chills. No dizziness or nausea or vomiting.     Past Medical History  Diagnosis Date  . Hypertension   . Diabetes mellitus without complication (HCC)   . IBS (irritable bowel syndrome)   . Wolff-Parkinson-White (WPW) syndrome   . Anxiety   . Depressed   . Paroxysmal SVT (supraventricular tachycardia) (HCC) WPW    There are no active problems to display for this patient.   Past Surgical History  Procedure Laterality Date  . Cholecystectomy    . Heart ablation    . Abdominal surgery    . Cardiac electrophysiology mapping and ablation    . Endometrial ablation    . Cesarean section      Current Outpatient Rx  Name  Route  Sig  Dispense  Refill  . amLODipine (NORVASC) 10 MG tablet   Oral   Take 10 mg by mouth daily.         Marland Kitchen ibuprofen (ADVIL,MOTRIN) 600 MG tablet   Oral   Take 1 tablet (600 mg total) by mouth every 8 (eight) hours as needed.   20 tablet   0   . insulin aspart (NOVOLOG) 100 UNIT/ML injection   Subcutaneous   Inject into the skin 3 (three) times daily before meals.         . insulin detemir (LEVEMIR) 100 UNIT/ML injection   Subcutaneous   Inject 26 Units into the skin at bedtime.         Marland Kitchen lisinopril (PRINIVIL,ZESTRIL) 10 MG tablet   Oral   Take 40 mg by mouth daily.          . magnesium citrate SOLN   Oral   Take 296 mLs (1 Bottle total) by mouth once.    195 mL   0   . meloxicam (MOBIC) 15 MG tablet   Oral   Take 1 tablet (15 mg total) by mouth daily.   30 tablet   1   . metroNIDAZOLE (FLAGYL) 500 MG tablet   Oral   Take 1 tablet (500 mg total) by mouth 2 (two) times daily.   14 tablet   0   . moxifloxacin (VIGAMOX) 0.5 % ophthalmic solution   Both Eyes   Place 1 drop into both eyes 3 (three) times daily. X 5 days   3 mL   0   . nitrofurantoin, macrocrystal-monohydrate, (MACROBID) 100 MG capsule   Oral   Take 1 capsule (100 mg total) by mouth 2 (two) times daily.   10 capsule   0   . ondansetron (ZOFRAN) 4 MG tablet   Oral   Take 1 tablet (4 mg total) by mouth every 8 (eight) hours as needed for nausea or vomiting.   20 tablet   0   . orphenadrine (NORFLEX) 100 MG tablet   Oral   Take 1 tablet (100 mg total) by mouth 2 (two) times daily.   60 tablet   0   . predniSONE (DELTASONE)  10 MG tablet      Take 60 mg x 2 days, then 50 mg x 2 days, then 40 mg x 2 days, then 30 mg x 2 days, then 20 mg x 2 days, then 10 mg x 2 days   42 tablet   0   . sertraline (ZOLOFT) 50 MG tablet   Oral   Take 50 mg by mouth daily.           Allergies Iodine; Shellfish allergy; and Penicillins  Family History  Problem Relation Age of Onset  . Gallbladder disease Mother     Social History Social History  Substance Use Topics  . Smoking status: Former Games developermoker  . Smokeless tobacco: Never Used  . Alcohol Use: Yes     Comment: socially    Review of Systems  Constitutional: Negative for fever.   Cardiovascular: Negative for chest pain Respiratory: Negative for Cough Gastrointestinal: As above Genitourinary: Negative for dysuria. Musculoskeletal: Mild back discomfort Skin: Negative for rash. Neurological: Negative for headaches Psychiatric: Mild anxiety    ____________________________________________   PHYSICAL EXAM:  VITAL SIGNS: ED Triage Vitals  Enc Vitals Group     BP 03/04/16 1601 159/112 mmHg      Pulse Rate 03/04/16 1601 90     Resp 03/04/16 1601 18     Temp 03/04/16 1601 98.4 F (36.9 C)     Temp Source 03/04/16 1601 Oral     SpO2 03/04/16 1601 100 %     Weight 03/04/16 1601 181 lb (82.101 kg)     Height 03/04/16 1601 5\' 3"  (1.6 m)     Head Cir --      Peak Flow --      Pain Score 03/04/16 1602 0     Pain Loc --      Pain Edu? --      Excl. in GC? --      Constitutional: Alert and oriented. Well appearing and in no distress. Pleasant and interactive Eyes: Conjunctivae are normal. No erythema or injection ENT   Head: Normocephalic and atraumatic.   Mouth/Throat: Mucous membranes are moist. Cardiovascular: Normal rate, regular rhythm. Normal and symmetric distal pulses are present in the upper extremities.  Respiratory: Normal respiratory effort without tachypnea nor retractions. Breath sounds are clear and equal bilaterally.  Gastrointestinal: Soft and non-tender in all quadrants. No distention. There is no CVA tenderness. Genitourinary: deferred Musculoskeletal: Nontender with normal range of motion in all extremities.  Neurologic:  Normal speech and language. No gross focal neurologic deficits are appreciated. Skin:  Skin is warm, dry and intact. No rash noted. Psychiatric: Mood and affect are normal. Patient exhibits appropriate insight and judgment.  ____________________________________________    LABS (pertinent positives/negatives)  Labs Reviewed  HCG, QUANTITATIVE, PREGNANCY - Abnormal; Notable for the following:    hCG, Beta Chain, Quant, S 152 (*)    All other components within normal limits  CBC - Abnormal; Notable for the following:    Hemoglobin 11.9 (*)    RDW 15.6 (*)    All other components within normal limits  COMPREHENSIVE METABOLIC PANEL - Abnormal; Notable for the following:    Glucose, Bld 150 (*)    ALT 13 (*)    All other components within normal limits  URINALYSIS COMPLETEWITH MICROSCOPIC ([facility] ONLY)  ABO/RH  TYPE AND SCREEN     ____________________________________________   EKG  None  ____________________________________________    RADIOLOGY  None  ____________________________________________   PROCEDURES  Procedure(s) performed: none  Critical Care performed: none  ____________________________________________   INITIAL IMPRESSION / ASSESSMENT AND PLAN / ED COURSE  Pertinent labs & imaging results that were available during my care of the patient were reviewed by me and considered in my medical decision making (see chart for details).  Discussed with Dr. Feliberto Gottron, he will admit the patient for surgery  ____________________________________________   FINAL CLINICAL IMPRESSION(S) / ED DIAGNOSES  Final diagnoses:  Ectopic pregnancy          Jene Every, MD 03/04/16 1750

## 2016-03-04 NOTE — Anesthesia Preprocedure Evaluation (Addendum)
Anesthesia Evaluation  Patient identified by MRN, date of birth, ID band Patient awake    Reviewed: Allergy & Precautions, H&P , NPO status , Patient's Chart, lab work & pertinent test results, reviewed documented beta blocker date and time   History of Anesthesia Complications Negative for: history of anesthetic complications  Airway Mallampati: III  TM Distance: >3 FB Neck ROM: full    Dental no notable dental hx. (+) Partial Upper   Pulmonary neg shortness of breath, neg sleep apnea, neg COPD, neg recent URI, former smoker,    Pulmonary exam normal breath sounds clear to auscultation       Cardiovascular Exercise Tolerance: Good hypertension, On Medications (-) angina(-) CAD, (-) Past MI, (-) Cardiac Stents and (-) CABG Normal cardiovascular exam+ dysrhythmias (WPW s/p ablation) Supra Ventricular Tachycardia (-) Valvular Problems/Murmurs Rhythm:regular Rate:Normal     Neuro/Psych PSYCHIATRIC DISORDERS (Depression and anxiety) negative neurological ROS     GI/Hepatic negative GI ROS, Neg liver ROS,   Endo/Other  diabetes, Insulin Dependent  Renal/GU negative Renal ROS  negative genitourinary   Musculoskeletal   Abdominal   Peds  Hematology negative hematology ROS (+)   Anesthesia Other Findings Past Medical History:   Hypertension                                                 Diabetes mellitus without complication (HCC)                 IBS (irritable bowel syndrome)                               Wolff-Parkinson-White (WPW) syndrome                         Anxiety                                                      Depressed                                                    Paroxysmal SVT (supraventricular tachycardia) * WPW         Patient last ate around 11am, so plan will be an RSI with cricoid pressure to prevent aspiration.  Patient denies recent cocaine use.  I told her that cocaine has a very bad  reaction with anesthesia that could result in death and again she denied use and voiced understanding of the risks.  Reproductive/Obstetrics (+) Pregnancy                            Anesthesia Physical Anesthesia Plan  ASA: III  Anesthesia Plan: General ETT, Rapid Sequence and Cricoid Pressure   Post-op Pain Management:    Induction:   Airway Management Planned:   Additional Equipment:   Intra-op Plan:   Post-operative Plan:   Informed Consent: I have reviewed the patients History and Physical, chart, labs  and discussed the procedure including the risks, benefits and alternatives for the proposed anesthesia with the patient or authorized representative who has indicated his/her understanding and acceptance.   Dental Advisory Given  Plan Discussed with: Anesthesiologist, CRNA and Surgeon  Anesthesia Plan Comments:         Anesthesia Quick Evaluation

## 2016-03-04 NOTE — Transfer of Care (Signed)
Immediate Anesthesia Transfer of Care Note  Patient: Jody LawrenceShonda Chavez  Procedure(s) Performed: Procedure(s): DIAGNOSTIC LAPAROSCOPY WITH right salpingectomy and REMOVAL OF ECTOPIC PREGNANCY, extensive lysis of adhesions (Right)  Patient Location: PACU  Anesthesia Type:General  Level of Consciousness: sedated  Airway & Oxygen Therapy: Patient Spontanous Breathing and Patient connected to face mask oxygen  Post-op Assessment: Report given to RN and Post -op Vital signs reviewed and stable  Post vital signs: Reviewed and stable  Last Vitals:  Filed Vitals:   03/04/16 1806 03/04/16 2053  BP: 107/73 119/81  Pulse: 80   Temp:  35.8 C  Resp:  16    Complications: No apparent anesthesia complications

## 2016-03-04 NOTE — ED Notes (Addendum)
Pt sent to ER by Dr Evelene CroonSchimmerhorn per her report for IV fluids. Pt states that she is having surgery to her right fallopian tube due to vaginal bleeding and ectopic pregnancy. States that she was sent here and told that ER would get her prepped for surgery. Pt alert and oriented X4, active, cooperative, pt in NAD. RR even and unlabored, color WNL.  Pt states that she was in office of Dr and was told she is having ectopic pregnancy, ultrasound was performed, surgery was scheduled and patient sent here.

## 2016-03-04 NOTE — Anesthesia Procedure Notes (Signed)
Procedure Name: Intubation Date/Time: 03/04/2016 7:09 PM Performed by: Stormy FabianURTIS, Serita Degroote Pre-anesthesia Checklist: Patient identified, Patient being monitored, Timeout performed, Emergency Drugs available and Suction available Patient Re-evaluated:Patient Re-evaluated prior to inductionOxygen Delivery Method: Circle system utilized Preoxygenation: Pre-oxygenation with 100% oxygen Intubation Type: IV induction, Rapid sequence and Cricoid Pressure applied Ventilation: Mask ventilation without difficulty Laryngoscope Size: Mac and 3 Grade View: Grade I Tube type: Oral Tube size: 7.0 mm Number of attempts: 1 Airway Equipment and Method: Stylet Placement Confirmation: ETT inserted through vocal cords under direct vision,  positive ETCO2 and breath sounds checked- equal and bilateral Secured at: 21 cm Tube secured with: Tape Dental Injury: Teeth and Oropharynx as per pre-operative assessment

## 2016-03-04 NOTE — Brief Op Note (Signed)
03/04/2016  8:29 PM  PATIENT:  Agnes LawrenceShonda Sporer  35 y.o. female  PRE-OPERATIVE DIAGNOSIS:  right  Tubal ectopic  POST-OPERATIVE DIAGNOSIS:  Right tubal ectopic, significant pelvic adhesive disease   PROCEDURE:  Procedure(s): DIAGNOSTIC LAPAROSCOPY WITH right salpingectomy and REMOVAL OF ECTOPIC PREGNANCY, extensive lysis of adhesions (Right)  SURGEON:  Surgeon(s) and Role:    Suzy Bouchard* Herberto Ledwell J Allix Blomquist, MD - Primary  PHYSICIAN ASSISTANT:    ASSISTANTS: scrub tech   ANESTHESIA:   general  EBL:  50 hemoperitoneum , IOF 1300  OU 400 cc BLOOD ADMINISTERED:none  DRAINS: none   LOCAL MEDICATIONS USED:  MARCAINE     SPECIMEN:  Source of Specimen:  right fallopian tube with ectopic  DISPOSITION OF SPECIMEN:  PATHOLOGY  COUNTS:  YES  TOURNIQUET:  * No tourniquets in log *  DICTATION: .Other Dictation: Dictation Number verbal  PLAN OF CARE: Discharge to home after PACU  PATIENT DISPOSITION:  PACU - hemodynamically stable.   Delay start of Pharmacological VTE agent (>24hrs) due to surgical blood loss or risk of bleeding: not applicable

## 2016-03-05 NOTE — Op Note (Signed)
NAMELAKEITHIA, RASOR               ACCOUNT NO.:  [id number]  MEDICAL RECORD NO.:  [id number]  LOCATION:  [facility]                         FACILITY:  [facility]  PHYSICIAN:  Jennell Corner, MDDATE OF BIRTH:  04-13-1981  DATE OF PROCEDURE: DATE OF DISCHARGE:  03/04/2016                              OPERATIVE REPORT   PREOPERATIVE DIAGNOSIS:  Right tubal ectopic pregnancy.  POSTOPERATIVE DIAGNOSIS: 1. Right tubal ectopic pregnancy. 2. Extensive pelvic adhesions.  PROCEDURE PERFORMED: 1. Laparoscopic right salpingectomy. 2. Extensive pelvic adhesiolysis.  SURGEON:  Jennell Corner, MD  ANESTHESIA:  General endotracheal anesthesia.  SURGEON:  Jennell Corner, MD  FIRST ASSISTANT:  Scrub tech.  INDICATIONS:  A 35 year old, gravida 5, para 1 patient with abnormally rising beta hCG levels from 113 to 121 over 48 hour.  The patient with an ultrasound the day before the procedure demonstrating a 3-cm right adnexal mass inferior to the right ovary.  DESCRIPTION OF PROCEDURE:  After adequate general endotracheal anesthesia, patient was placed in dorsal supine position with the legs in the [place] stirrups.  Patient's abdomen was prepped and draped in normal sterile fashion as well as vagina with Hibiclens given her Betadine allergy.  Time-out was performed.  Speculum was placed in the vagina and the anterior cervix was grasped with a single-tooth tenaculum and a Kahn cannula was placed in the endocervical canal to be used for uterine manipulation during the procedure.  Straight catheterization of the bladder yielded 200 mL of urine.  Gloves were changed.  A 15 mm infraumbilical incision was made and the laparoscope was advanced into the abdominal cavity under direct visualization utilizing the Optiview cannula.  Initial impression showed some non-clotted blood in the lower pelvis, a 2nd port was placed 3 cm medial to the left anterior iliac spine.  An 11 mm port was advanced  under direct visualization.  A grasper was placed and initial impression verified a right tubal ectopic pregnancy distending the distal 2/3rd of the fallopian tube.  There were significant adhesions in the posterior cul-de-sac with encasement of both ovaries and multiple adhesions from the sigmoid to the posterior uterus, these were mainly filmy adhesions.  Third port site was then advanced from the right lower quadrant 3 cm medial to the right anterior iliac spine.  A 5 mm port was advanced into the abdominal cavity under direct visualization.  Harmonic Scalpel was brought up to the operative field.  Initially, the fallopian tube was freed from adhesions to the posterior uterus overall greater than 50% of total operating time entailed in lysis of adhesions.  Once the distal portion of the fallopian tube was freed, the mesosalpinx was cauterized with Kleppinger cautery and Harmonic Scalpel was used to dissect the fallopian tube free.  This was done after the course of the right ureter was identified and was free of this area.  The right fallopian tube was placed in the posterior cul-de-sac while extensive adhesiolysis continued. Ultimately, the right ovary was significantly freed from the adhesions as well as the left ovary, left fallopian tube appeared normal in its course with only some small scarring at the distal 3rd.  The endobag was brought into the operative field and the ectopic  pregnancy and right fallopian tube was placed in the bag and removed and sent to Pathology for identification.  The patient's abdomen was irrigated and suctioned. Pressure was lowered to 7 mmHg which demonstrated good hemostasis. Upper abdomen appeared normal.  There were no complications during the procedure.  All instruments were removed after deflating the abdomen. The fascia was closed with 2-0 Vicryl suture.  The left lower port site and at the infraumbilical area, all 3 incisions were then closed  with interrupted 4-0 Vicryl suture.  A 2x2 dressing and Tegaderm placed.  The patient's vagina was then reinspected and the single-tooth tenaculum was removed.  Good hemostasis was noted.  The Kahn cannula was removed. Bladder was re-drained given prompt reaccumulation during the case, additional 200 mL were drained.  ESTIMATED BLOOD LOSS:  50 mL.  Hemoperitoneum, minimal bleeding during the procedure.  INTRAOPERATIVE FLUIDS:  1300 mL.  URINE OUTPUT:  400 mL.  Patient tolerated the procedure well and was taken to recovery room in good condition.  Of note, 0.5% Marcaine was used to inject all port sites prior to placement of cannulas.          ______________________________ Jennell Cornerhomas Schermerhorn, MD     TS/MEDQ  D:  03/04/2016  T:  03/05/2016  Job:  [id number]

## 2016-03-06 ENCOUNTER — Encounter: Payer: Self-pay | Admitting: Obstetrics and Gynecology

## 2016-03-07 NOTE — Anesthesia Postprocedure Evaluation (Signed)
Anesthesia Post Note  Patient: Agnes LawrenceShonda Smyre  Procedure(s) Performed: Procedure(s) (LRB): DIAGNOSTIC LAPAROSCOPY WITH right salpingectomy and REMOVAL OF ECTOPIC PREGNANCY, extensive lysis of adhesions (Right)  Patient location during evaluation: PACU Anesthesia Type: General Level of consciousness: awake and alert Pain management: pain level controlled Vital Signs Assessment: post-procedure vital signs reviewed and stable Respiratory status: spontaneous breathing, nonlabored ventilation, respiratory function stable and patient connected to nasal cannula oxygen Cardiovascular status: blood pressure returned to baseline and stable Postop Assessment: no signs of nausea or vomiting Anesthetic complications: no    Last Vitals:  Filed Vitals:   03/04/16 2220 03/04/16 2225  BP: 141/100   Pulse: 104 92  Temp:    Resp: 20     Last Pain:  Filed Vitals:   03/04/16 2245  PainSc: 2                  Lenard SimmerAndrew Roma Bondar

## 2016-03-08 LAB — SURGICAL PATHOLOGY

## 2016-05-16 ENCOUNTER — Emergency Department
Admission: EM | Admit: 2016-05-16 | Discharge: 2016-05-16 | Disposition: A | Discharge status: Home / Self Care | Payer: Medicaid Other | Attending: Emergency Medicine | Admitting: Emergency Medicine

## 2016-05-16 ENCOUNTER — Encounter: Payer: Self-pay | Admitting: *Deleted

## 2016-05-16 DIAGNOSIS — I10 Essential (primary) hypertension: Secondary | ICD-10-CM | POA: Diagnosis not present

## 2016-05-16 DIAGNOSIS — Z87891 Personal history of nicotine dependence: Secondary | ICD-10-CM | POA: Diagnosis not present

## 2016-05-16 DIAGNOSIS — R51 Headache: Secondary | ICD-10-CM | POA: Diagnosis present

## 2016-05-16 DIAGNOSIS — E119 Type 2 diabetes mellitus without complications: Secondary | ICD-10-CM | POA: Insufficient documentation

## 2016-05-16 DIAGNOSIS — F329 Major depressive disorder, single episode, unspecified: Secondary | ICD-10-CM | POA: Diagnosis not present

## 2016-05-16 DIAGNOSIS — Z79899 Other long term (current) drug therapy: Secondary | ICD-10-CM | POA: Insufficient documentation

## 2016-05-16 DIAGNOSIS — R519 Headache, unspecified: Secondary | ICD-10-CM

## 2016-05-16 LAB — CBC
HCT: 39 % (ref 35.0–47.0)
Hemoglobin: 12.9 g/dL (ref 12.0–16.0)
MCH: 29.6 pg (ref 26.0–34.0)
MCHC: 33.2 g/dL (ref 32.0–36.0)
MCV: 89.2 fL (ref 80.0–100.0)
PLATELETS: 255 10*3/uL (ref 150–440)
RBC: 4.37 MIL/uL (ref 3.80–5.20)
RDW: 14.9 % — AB (ref 11.5–14.5)
WBC: 7.2 10*3/uL (ref 3.6–11.0)

## 2016-05-16 LAB — BASIC METABOLIC PANEL
Anion gap: 7 (ref 5–15)
BUN: 10 mg/dL (ref 6–20)
CO2: 26 mmol/L (ref 22–32)
CREATININE: 0.9 mg/dL (ref 0.44–1.00)
Calcium: 9 mg/dL (ref 8.9–10.3)
Chloride: 102 mmol/L (ref 101–111)
Glucose, Bld: 183 mg/dL — ABNORMAL HIGH (ref 65–99)
Potassium: 3.7 mmol/L (ref 3.5–5.1)
SODIUM: 135 mmol/L (ref 135–145)

## 2016-05-16 LAB — GLUCOSE, CAPILLARY: GLUCOSE-CAPILLARY: 171 mg/dL — AB (ref 65–99)

## 2016-05-16 MED ORDER — PROCHLORPERAZINE MALEATE 10 MG PO TABS: 10.0000 mg | ORAL_TABLET | Freq: Three times a day (TID) | ORAL | Status: DC | PRN

## 2016-05-16 MED ORDER — SODIUM CHLORIDE 0.9 % IV BOLUS (SEPSIS)
1000.0000 mL | Freq: Once | INTRAVENOUS | Status: AC
  Administered 2016-05-16: 1000 mL via INTRAVENOUS

## 2016-05-16 MED ORDER — PROCHLORPERAZINE EDISYLATE 5 MG/ML IJ SOLN
10.0000 mg | Freq: Once | INTRAMUSCULAR | Status: AC
  Administered 2016-05-16: 10 mg via INTRAVENOUS
  Filled 2016-05-16 (×2): qty 2

## 2016-05-16 NOTE — ED Notes (Addendum)
States headache for 3 days, states no relief with ASA, states photosensitivity, pt awake and alert in no acute distress, states right arm radiating pain, states hx of DM 1 with uncontrolled sugars, pt has insulin pump in place

## 2016-05-16 NOTE — Discharge Instructions (Signed)
Please seek medical attention for any high fevers, chest pain, shortness of breath, change in behavior, persistent vomiting, bloody stool or any other new or concerning symptoms. ° ° °General Headache Without Cause °A headache is pain or discomfort felt around the head or neck area. The specific cause of a headache may not be found. There are many causes and types of headaches. A few common ones are: °· Tension headaches. °· Migraine headaches. °· Cluster headaches. °· Chronic daily headaches. °HOME CARE INSTRUCTIONS  °Watch your condition for any changes. Take these steps to help with your condition: °Managing Pain °· Take over-the-counter and prescription medicines only as told by your health care provider. °· Lie down in a dark, quiet room when you have a headache. °· If directed, apply ice to the head and neck area: °¨ Put ice in a plastic bag. °¨ Place a towel between your skin and the bag. °¨ Leave the ice on for 20 minutes, 2-3 times per day. °· Use a heating pad or hot shower to apply heat to the head and neck area as told by your health care provider. °· Keep lights dim if bright lights bother you or make your headaches worse. °Eating and Drinking °· Eat meals on a regular schedule. °· Limit alcohol use. °· Decrease the amount of caffeine you drink, or stop drinking caffeine. °General Instructions °· Keep all follow-up visits as told by your health care provider. This is important. °· Keep a headache journal to help find out what may trigger your headaches. For example, write down: °¨ What you eat and drink. °¨ How much sleep you get. °¨ Any change to your diet or medicines. °· Try massage or other relaxation techniques. °· Limit stress. °· Sit up straight, and do not tense your muscles. °· Do not use tobacco products, including cigarettes, chewing tobacco, or e-cigarettes. If you need help quitting, ask your health care provider. °· Exercise regularly as told by your health care provider. °· Sleep on a  regular schedule. Get 7-9 hours of sleep, or the amount recommended by your health care provider. °SEEK MEDICAL CARE IF:  °· Your symptoms are not helped by medicine. °· You have a headache that is different from the usual headache. °· You have nausea or you vomit. °· You have a fever. °SEEK IMMEDIATE MEDICAL CARE IF:  °· Your headache becomes severe. °· You have repeated vomiting. °· You have a stiff neck. °· You have a loss of vision. °· You have problems with speech. °· You have pain in the eye or ear. °· You have muscular weakness or loss of muscle control. °· You lose your balance or have trouble walking. °· You feel faint or pass out. °· You have confusion. °  °This information is not intended to replace advice given to you by your health care provider. Make sure you discuss any questions you have with your health care provider. °  °Document Released: 10/17/2005 Document Revised: 07/08/2015 Document Reviewed: 02/09/2015 °Elsevier Interactive Patient Education ©2016 [employer] ° °

## 2016-05-16 NOTE — ED Provider Notes (Signed)
[facility] Emergency Department Provider Note    ____________________________________________  Time seen: ~1850  I have reviewed the triage vital signs and the nursing notes.   HISTORY  Chief Complaint Headache   History limited by: Not Limited   HPI Jody Chavez is a 35 y.o. female who presents to the emergency department today because of concerns for headache. It is been going on for 3 days. It is been constant. It is severe. She describes it as throbbing. Primarily located on the right side. It is worse with bright lights. She has tried aspirin without any great relief. She denies any fevers.   Past Medical History  Diagnosis Date  . Hypertension   . Diabetes mellitus without complication (HCC)   . IBS (irritable bowel syndrome)   . Wolff-Parkinson-White (WPW) syndrome   . Anxiety   . Depressed   . Paroxysmal SVT (supraventricular tachycardia) (HCC) WPW    There are no active problems to display for this patient.   Past Surgical History  Procedure Laterality Date  . Cholecystectomy    . Heart ablation    . Abdominal surgery    . Cardiac electrophysiology mapping and ablation    . Endometrial ablation    . Cesarean section    . Diagnostic laparoscopy with removal of ectopic pregnancy Right 03/04/2016    Procedure: DIAGNOSTIC LAPAROSCOPY WITH right salpingectomy and REMOVAL OF ECTOPIC PREGNANCY, extensive lysis of adhesions;  Surgeon: Suzy Bouchard, MD;  Location: [facility] ORS;  Service: Gynecology;  Laterality: Right;    Current Outpatient Rx  Name  Route  Sig  Dispense  Refill  . amLODipine (NORVASC) 10 MG tablet   Oral   Take 10 mg by mouth daily.         Marland Kitchen HYDROcodone-acetaminophen (NORCO/VICODIN) 5-325 MG tablet   Oral   Take 1-2 tablets by mouth every 6 (six) hours as needed for moderate pain.         Marland Kitchen ibuprofen (ADVIL,MOTRIN) 600 MG tablet   Oral   Take 1 tablet (600 mg total) by mouth every 8 (eight) hours as  needed. Patient not taking: Reported on 03/04/2016   20 tablet   0   . Insulin Human (INSULIN PUMP) SOLN      Pt uses up to 60 units daily of NOVOLOG.         Marland Kitchen lisinopril (PRINIVIL,ZESTRIL) 10 MG tablet   Oral   Take 40 mg by mouth daily.          . metroNIDAZOLE (FLAGYL) 500 MG tablet   Oral   Take 1 tablet (500 mg total) by mouth 2 (two) times daily. Patient not taking: Reported on 03/04/2016   14 tablet   0   . moxifloxacin (VIGAMOX) 0.5 % ophthalmic solution   Both Eyes   Place 1 drop into both eyes 3 (three) times daily. X 5 days Patient not taking: Reported on 03/04/2016   3 mL   0   . naproxen (NAPROSYN) 500 MG tablet   Oral   Take 500 mg by mouth 2 (two) times daily as needed.         . nitrofurantoin, macrocrystal-monohydrate, (MACROBID) 100 MG capsule   Oral   Take 1 capsule (100 mg total) by mouth 2 (two) times daily. Patient not taking: Reported on 03/04/2016   10 capsule   0   . sertraline (ZOLOFT) 50 MG tablet   Oral   Take 50 mg by mouth at bedtime.  Allergies Iodine; Shellfish allergy; and Penicillins  Family History  Problem Relation Age of Onset  . Gallbladder disease Mother     Social History Social History  Substance Use Topics  . Smoking status: Former Games developermoker  . Smokeless tobacco: Never Used  . Alcohol Use: Yes     Comment: socially    Review of Systems  Constitutional: Negative for fever. Cardiovascular: Negative for chest pain. Respiratory: Negative for shortness of breath. Gastrointestinal: Negative for abdominal pain, vomiting and diarrhea. Neurological: Positive for headache   10-point ROS otherwise negative.  ____________________________________________   PHYSICAL EXAM:  VITAL SIGNS: ED Triage Vitals  Enc Vitals Group     BP 05/16/16 1747 143/102 mmHg     Pulse Rate 05/16/16 1747 116     Resp 05/16/16 1747 18     Temp 05/16/16 1747 98.9 F (37.2 C)     Temp Source 05/16/16 1747 Oral     SpO2  05/16/16 1747 99 %     Weight 05/16/16 1747 174 lb (78.926 kg)     Height 05/16/16 1747 5\' 3"  (1.6 m)     Head Cir --      Peak Flow --      Pain Score 05/16/16 1748 7   Constitutional: Alert and oriented. Well appearing and in no distress. Eyes: Conjunctivae are normal. PERRL. Normal extraocular movements. ENT   Head: Normocephalic and atraumatic.   Nose: No congestion/rhinnorhea.   Mouth/Throat: Mucous membranes are moist.   Neck: No stridor. Hematological/Lymphatic/Immunilogical: No cervical lymphadenopathy. Cardiovascular: Normal rate, regular rhythm.  No murmurs, rubs, or gallops. Respiratory: Normal respiratory effort without tachypnea nor retractions. Breath sounds are clear and equal bilaterally. No wheezes/rales/rhonchi. Gastrointestinal: Soft and nontender. No distention.  Genitourinary: Deferred Musculoskeletal: Normal range of motion in all extremities. No joint effusions.  No lower extremity tenderness nor edema. Neurologic:  Normal speech and language. No gross focal neurologic deficits are appreciated.  Skin:  Skin is warm, dry and intact. No rash noted. Psychiatric: Mood and affect are normal. Speech and behavior are normal. Patient exhibits appropriate insight and judgment.  ____________________________________________    LABS (pertinent positives/negatives)  Labs Reviewed  BASIC METABOLIC PANEL - Abnormal; Notable for the following:    Glucose, Bld 183 (*)    All other components within normal limits  CBC - Abnormal; Notable for the following:    RDW 14.9 (*)    All other components within normal limits  GLUCOSE, CAPILLARY - Abnormal; Notable for the following:    Glucose-Capillary 171 (*)    All other components within normal limits  URINALYSIS COMPLETEWITH MICROSCOPIC ([facility] ONLY)  CBG MONITORING, ED     ____________________________________________   EKG  None  ____________________________________________     RADIOLOGY  None  ____________________________________________   PROCEDURES  Procedure(s) performed: None  Critical Care performed: No  ____________________________________________   INITIAL IMPRESSION / ASSESSMENT AND PLAN / ED COURSE  Pertinent labs & imaging results that were available during my care of the patient were reviewed by me and considered in my medical decision making (see chart for details).  Patient presented to the emergency department today because of headache for 3 days. No focal neuro deficits on exam. Patient was given fluids and medication with good relief of the headache. Will discharge home with Compazine.  ____________________________________________   FINAL CLINICAL IMPRESSION(S) / ED DIAGNOSES  Final diagnoses:  Headache, unspecified headache type     Note: This dictation was prepared with Dragon dictation. Any transcriptional errors that  result from this process are unintentional    Phineas Semen, MD 05/16/16 2224

## 2016-10-27 ENCOUNTER — Emergency Department: Payer: Medicaid Other

## 2016-10-27 ENCOUNTER — Encounter: Payer: Self-pay | Admitting: Emergency Medicine

## 2016-10-27 ENCOUNTER — Emergency Department
Admission: EM | Admit: 2016-10-27 | Discharge: 2016-10-27 | Disposition: A | Discharge status: Home / Self Care | Payer: Medicaid Other | Attending: Emergency Medicine | Admitting: Emergency Medicine

## 2016-10-27 DIAGNOSIS — E1165 Type 2 diabetes mellitus with hyperglycemia: Secondary | ICD-10-CM | POA: Insufficient documentation

## 2016-10-27 DIAGNOSIS — I10 Essential (primary) hypertension: Secondary | ICD-10-CM | POA: Diagnosis not present

## 2016-10-27 DIAGNOSIS — Z79899 Other long term (current) drug therapy: Secondary | ICD-10-CM | POA: Diagnosis not present

## 2016-10-27 DIAGNOSIS — Z87891 Personal history of nicotine dependence: Secondary | ICD-10-CM | POA: Insufficient documentation

## 2016-10-27 DIAGNOSIS — R739 Hyperglycemia, unspecified: Secondary | ICD-10-CM

## 2016-10-27 DIAGNOSIS — E860 Dehydration: Secondary | ICD-10-CM | POA: Insufficient documentation

## 2016-10-27 DIAGNOSIS — Z794 Long term (current) use of insulin: Secondary | ICD-10-CM | POA: Insufficient documentation

## 2016-10-27 LAB — BASIC METABOLIC PANEL
Anion gap: 12 (ref 5–15)
Anion gap: 6 (ref 5–15)
BUN: 22 mg/dL — ABNORMAL HIGH (ref 6–20)
BUN: 19 mg/dL (ref 6–20)
CALCIUM: 9.4 mg/dL (ref 8.9–10.3)
CHLORIDE: 106 mmol/L (ref 101–111)
CO2: 19 mmol/L — AB (ref 22–32)
CO2: 22 mmol/L (ref 22–32)
CREATININE: 1.16 mg/dL — AB (ref 0.44–1.00)
CREATININE: 0.86 mg/dL (ref 0.44–1.00)
Calcium: 8.5 mg/dL — ABNORMAL LOW (ref 8.9–10.3)
Chloride: 99 mmol/L — ABNORMAL LOW (ref 101–111)
GFR calc non Af Amer: >60 mL/min (ref >60)
GLUCOSE: 457 mg/dL — AB (ref 65–99)
GLUCOSE: 202 mg/dL — AB (ref 65–99)
Potassium: 4.3 mmol/L (ref 3.5–5.1)
Potassium: 4.3 mmol/L (ref 3.5–5.1)
Sodium: 130 mmol/L — ABNORMAL LOW (ref 135–145)
Sodium: 134 mmol/L — ABNORMAL LOW (ref 135–145)

## 2016-10-27 LAB — URINALYSIS, COMPLETE (UACMP) WITH MICROSCOPIC
BILIRUBIN URINE: NEGATIVE
Glucose, UA: >=500 mg/dL — AB
HGB URINE DIPSTICK: NEGATIVE
KETONES UR: 80 mg/dL — AB
Leukocytes, UA: NEGATIVE
NITRITE: NEGATIVE
PROTEIN: NEGATIVE mg/dL
Specific Gravity, Urine: 1.027 (ref 1.005–1.030)
pH: 5 (ref 5.0–8.0)

## 2016-10-27 LAB — CBC
HCT: 41.8 % (ref 35.0–47.0)
Hemoglobin: 14 g/dL (ref 12.0–16.0)
MCH: 29.6 pg (ref 26.0–34.0)
MCHC: 33.5 g/dL (ref 32.0–36.0)
MCV: 88.1 fL (ref 80.0–100.0)
PLATELETS: 267 10*3/uL (ref 150–440)
RBC: 4.75 MIL/uL (ref 3.80–5.20)
RDW: 16.1 % — AB (ref 11.5–14.5)
WBC: 13.6 10*3/uL — ABNORMAL HIGH (ref 3.6–11.0)

## 2016-10-27 LAB — GLUCOSE, CAPILLARY
GLUCOSE-CAPILLARY: 528 mg/dL — AB (ref 65–99)
Glucose-Capillary: 177 mg/dL — ABNORMAL HIGH (ref 65–99)

## 2016-10-27 LAB — POCT PREGNANCY, URINE: Preg Test, Ur: NEGATIVE

## 2016-10-27 MED ORDER — INSULIN ASPART 100 UNIT/ML ~~LOC~~ SOLN
10.0000 [IU] | Freq: Once | SUBCUTANEOUS | Status: AC
  Administered 2016-10-27: 10 [IU] via INTRAVENOUS
  Filled 2016-10-27: qty 10

## 2016-10-27 MED ORDER — SODIUM CHLORIDE 0.9 % IV BOLUS (SEPSIS)
1000.0000 mL | Freq: Once | INTRAVENOUS | Status: AC
  Administered 2016-10-27: 1000 mL via INTRAVENOUS

## 2016-10-27 MED ORDER — ONDANSETRON 4 MG PO TBDP: 4.0000 mg | ORAL_TABLET | Freq: Three times a day (TID) | ORAL | 0 refills | Status: DC | PRN

## 2016-10-27 NOTE — ED Notes (Addendum)
Pt c/o right lower leg cramps X 2 days, generalized fatigue and high blood sugars at home. Denies any productive cough or congestion. Reports frequent UTI and yeast infections. Denies vaginal odors or itching.  Denies urinary sx. Has been taking her diabetic medications as prescribed. Pt alert and oriented X4, active, cooperative, pt in NAD. RR even and unlabored, color WNL.

## 2016-10-27 NOTE — ED Notes (Signed)
CBG 528 in triage

## 2016-10-27 NOTE — ED Notes (Signed)
Patient transported to X-ray 

## 2016-10-27 NOTE — ED Notes (Signed)
Pt given warm blanket and cup of water. Denies further needs at this time.

## 2016-10-27 NOTE — ED Provider Notes (Signed)
[facility] Emergency Department Provider Note  ____________________________________________  Time seen: Approximately 12:28 PM  I have reviewed the triage vital signs and the nursing notes.   HISTORY  Chief Complaint Hyperglycemia    HPI Jody Chavez is a 35 y.o. female who complains of leg cramps for the past 2 days and generalized fatigue. She has that her blood sugars been over 500 for last 2-3 days at home. Denies any pain chest pain shortness of breath cough abdominal pain and vomiting or diarrhea. No dysuria frequency or urgency. No other acute illness. Has been taking her insulin as usual.     Past Medical History:  Diagnosis Date  . Anxiety   . Depressed   . Diabetes mellitus without complication (HCC)   . Hypertension   . IBS (irritable bowel syndrome)   . Paroxysmal SVT (supraventricular tachycardia) (HCC) WPW  . Wolff-Parkinson-White (WPW) syndrome      There are no active problems to display for this patient.    Past Surgical History:  Procedure Laterality Date  . ABDOMINAL SURGERY    . CARDIAC ELECTROPHYSIOLOGY MAPPING AND ABLATION    . CESAREAN SECTION    . CHOLECYSTECTOMY    . DIAGNOSTIC LAPAROSCOPY WITH REMOVAL OF ECTOPIC PREGNANCY Right 03/04/2016   Procedure: DIAGNOSTIC LAPAROSCOPY WITH right salpingectomy and REMOVAL OF ECTOPIC PREGNANCY, extensive lysis of adhesions;  Surgeon: Suzy Bouchardhomas J Schermerhorn, MD;  Location: [facility] ORS;  Service: Gynecology;  Laterality: Right;  . ENDOMETRIAL ABLATION    . HEART ABLATION       Prior to Admission medications   Medication Sig Start Date End Date Taking? Authorizing Provider  amLODipine (NORVASC) 10 MG tablet Take 10 mg by mouth daily.   Yes Historical Provider, MD  insulin detemir (LEVEMIR) 100 UNIT/ML injection Inject 24-26 Units into the skin at bedtime.   Yes Historical Provider, MD  insulin lispro (HUMALOG) 100 UNIT/ML KiwkPen Inject 12-20 Units into the skin 4 (four) times daily  as needed. 08/01/16  Yes Historical Provider, MD  lisinopril (PRINIVIL,ZESTRIL) 10 MG tablet Take 40 mg by mouth daily.    Yes Historical Provider, MD  sertraline (ZOLOFT) 50 MG tablet Take 50 mg by mouth at bedtime.    Yes Historical Provider, MD  HYDROcodone-acetaminophen (NORCO/VICODIN) 5-325 MG tablet Take 1-2 tablets by mouth every 6 (six) hours as needed for moderate pain.    Historical Provider, MD  ibuprofen (ADVIL,MOTRIN) 600 MG tablet Take 1 tablet (600 mg total) by mouth every 8 (eight) hours as needed. Patient not taking: Reported on 03/04/2016 02/03/16   Phineas SemenGraydon Goodman, MD  metroNIDAZOLE (FLAGYL) 500 MG tablet Take 1 tablet (500 mg total) by mouth 2 (two) times daily. Patient not taking: Reported on 03/04/2016 10/01/15   Renford DillsLindsey Miller, NP  moxifloxacin (VIGAMOX) 0.5 % ophthalmic solution Place 1 drop into both eyes 3 (three) times daily. X 5 days Patient not taking: Reported on 03/04/2016 12/27/15   Payton Mccallumrlando Conty, MD  naproxen (NAPROSYN) 500 MG tablet Take 500 mg by mouth 2 (two) times daily as needed.    Historical Provider, MD  nitrofurantoin, macrocrystal-monohydrate, (MACROBID) 100 MG capsule Take 1 capsule (100 mg total) by mouth 2 (two) times daily. Patient not taking: Reported on 03/04/2016 10/01/15   Renford DillsLindsey Miller, NP  ondansetron (ZOFRAN ODT) 4 MG disintegrating tablet Take 1 tablet (4 mg total) by mouth every 8 (eight) hours as needed for nausea or vomiting. 10/27/16   Sharman CheekPhillip Philippe Gang, MD  prochlorperazine (COMPAZINE) 10 MG tablet Take 1 tablet (  10 mg total) by mouth every 8 (eight) hours as needed (headache). Patient not taking: Reported on 10/27/2016 05/16/16   Phineas SemenGraydon Goodman, MD     Allergies Iodine; Shellfish allergy; and Penicillins   Family History  Problem Relation Age of Onset  . Gallbladder disease Mother     Social History Social History  Substance Use Topics  . Smoking status: Former Games developermoker  . Smokeless tobacco: Never Used  . Alcohol use Yes     Comment:  socially    Review of Systems  Constitutional:   No fever or chills.Positive generalized weakness  ENT:   No sore throat. No rhinorrhea. Cardiovascular:   No chest pain. Respiratory:   No dyspnea or cough. Gastrointestinal:   Negative for abdominal pain, vomiting and diarrhea.  Genitourinary:   Negative for dysuria or difficulty urinating. Musculoskeletal:   Negative for focal pain or swelling Neurological:   Negative for headaches 10-point ROS otherwise negative.  ____________________________________________   PHYSICAL EXAM:  VITAL SIGNS: ED Triage Vitals  Enc Vitals Group     BP 10/27/16 0735 (!) 143/110     Pulse Rate 10/27/16 0735 (!) 145     Resp 10/27/16 0735 20     Temp 10/27/16 0735 98.5 F (36.9 C)     Temp Source 10/27/16 0735 Oral     SpO2 10/27/16 0735 100 %     Weight 10/27/16 0736 170 lb (77.1 kg)     Height 10/27/16 0736 5\' 3"  (1.6 m)     Head Circumference --      Peak Flow --      Pain Score 10/27/16 0735 5     Pain Loc --      Pain Edu? --      Excl. in GC? --     Vital signs reviewed, nursing assessments reviewed.   Constitutional:   Alert and oriented. Well appearing and in no distress. Eyes:   No scleral icterus. No conjunctival pallor. PERRL. EOMI.  No nystagmus. ENT   Head:   Normocephalic and atraumatic.   Nose:   No congestion/rhinnorhea. No septal hematoma   Mouth/Throat:   Dry mucous membranes, no pharyngeal erythema. No peritonsillar mass.    Neck:   No stridor. No SubQ emphysema. No meningismus. Hematological/Lymphatic/Immunilogical:   No cervical lymphadenopathy. Cardiovascular:   Tachycardia heart rate 110. Symmetric bilateral radial and DP pulses.  No murmurs.  Respiratory:   Normal respiratory effort without tachypnea nor retractions. Breath sounds are clear and equal bilaterally. No wheezes/rales/rhonchi. Gastrointestinal:   Soft and nontender. Non distended. There is no CVA tenderness.  No rebound, rigidity, or  guarding. Genitourinary:   deferred Musculoskeletal:   Nontender with normal range of motion in all extremities. No joint effusions.  No lower extremity tenderness.  No edema. Neurologic:   Normal speech and language.  CN 2-10 normal. Motor grossly intact. No gross focal neurologic deficits are appreciated.  Skin:    Skin is warm, dry and intact. No rash noted.  No petechiae, purpura, or bullae.  ____________________________________________    LABS (pertinent positives/negatives) (all labs ordered are listed, but only abnormal results are displayed) Labs Reviewed  BASIC METABOLIC PANEL - Abnormal; Notable for the following:       Result Value   Sodium 130 (*)    Chloride 99 (*)    CO2 19 (*)    Glucose, Bld 457 (*)    BUN 22 (*)    Creatinine, Ser 1.16 (*)    All other  components within normal limits  CBC - Abnormal; Notable for the following:    WBC 13.6 (*)    RDW 16.1 (*)    All other components within normal limits  URINALYSIS, COMPLETE (UACMP) WITH MICROSCOPIC - Abnormal; Notable for the following:    Color, Urine STRAW (*)    APPearance CLEAR (*)    Glucose, UA >=500 (*)    Ketones, ur 80 (*)    All other components within normal limits  GLUCOSE, CAPILLARY - Abnormal; Notable for the following:    Glucose-Capillary 528 (*)    All other components within normal limits  BASIC METABOLIC PANEL - Abnormal; Notable for the following:    Sodium 134 (*)    Glucose, Bld 202 (*)    Calcium 8.5 (*)    All other components within normal limits  GLUCOSE, CAPILLARY - Abnormal; Notable for the following:    Glucose-Capillary 177 (*)    All other components within normal limits  CBG MONITORING, ED  POC URINE PREG, ED  POCT PREGNANCY, URINE   ____________________________________________   EKG  Interpreted by me Sinus tachycardia rate 120, normal axis intervals QRS ST segments and T waves  ____________________________________________    RADIOLOGY Chest x-ray  unremarkable   ____________________________________________   PROCEDURES Procedures  ____________________________________________   INITIAL IMPRESSION / ASSESSMENT AND PLAN / ED COURSE  Pertinent labs & imaging results that were available during my care of the patient were reviewed by me and considered in my medical decision making (see chart for details).  Patient presents with tachycardia, clinically appears to be dehydrated, likely secondary to hyperglycemia causing osmotic diuresis. Initial labs reveal some urinary ketones as well as a slightly low bicarbonate level. However anion gap is normal. She is overall well appearing and I think her presentation is consistent with dehydration, but I don't think that this should be treated as diabetic ketoacidosis at this time. The patient was given IV fluids and was able to tolerate oral intake. A repeat of her chemistry shows normalization of the bicarbonate level. Continue her usual medicines, I'll discharge home with primary care follow-up. No evidence of any infectious pathology or thyroid dysfunction or other acute issues as a cause for her hyperglycemia.     Clinical Course    ____________________________________________   FINAL CLINICAL IMPRESSION(S) / ED DIAGNOSES  Final diagnoses:  Hyperglycemia  Dehydration      New Prescriptions   ONDANSETRON (ZOFRAN ODT) 4 MG DISINTEGRATING TABLET    Take 1 tablet (4 mg total) by mouth every 8 (eight) hours as needed for nausea or vomiting.     Portions of this note were generated with dragon dictation software. Dictation errors may occur despite best attempts at proofreading.    Sharman Cheek, MD 10/27/16 [phone]

## 2016-10-27 NOTE — ED Triage Notes (Signed)
Pt presents with hight blood sugar for two to three days up in the 500's.

## 2016-10-27 NOTE — ED Notes (Signed)
Pt stated that she felt a little better and was able to drink and eat with no problems. No nausea. Pt is resting comfortably in bed.

## 2016-10-27 NOTE — Progress Notes (Addendum)
Inpatient Diabetes Program Recommendations  AACE/ADA: New Consensus Statement on Inpatient Glycemic Control (2015)  Target Ranges:  Prepandial:   less than 140 mg/dL      Peak postprandial:   less than 180 mg/dL (1-2 hours)      Critically ill patients:  140 - 180 mg/dL   Review of Glycemic Control  Diabetes history: DM 1 Follows with Dr. Tedd SiasSolum (last visit 10/2, A1c 8.3% at that time) Outpatient Diabetes medications: Levemir 24-26 units, Humalog 12-20 units 4x/day  Inpatient Diabetes Program Recommendations:  Patient having DM 1 will need basal insulin on board today to prevent DKA. Consider Levemir 24 units now.  If admitted also consider, Novolog Sensitive Correction (0-9 units) TID + Novolog HS scale (0-5 units) + Novolog 4 units meal coverage TID when eating at least 50% of meals .  Thanks,  Christena DeemShannon Taveon Enyeart RN, MSN, [facility] Inpatient Diabetes Coordinator Team Pager [phone] (8a-5p)

## 2016-11-03 ENCOUNTER — Inpatient Hospital Stay
Admission: EM | Admit: 2016-11-03 | Discharge: 2016-11-04 | DRG: 638 | Disposition: A | Discharge status: Home / Self Care | Payer: BLUE CROSS/BLUE SHIELD | Attending: Specialist | Admitting: Specialist

## 2016-11-03 ENCOUNTER — Encounter: Payer: Self-pay | Admitting: Emergency Medicine

## 2016-11-03 DIAGNOSIS — R1010 Upper abdominal pain, unspecified: Secondary | ICD-10-CM | POA: Diagnosis not present

## 2016-11-03 DIAGNOSIS — T383X6A Underdosing of insulin and oral hypoglycemic [antidiabetic] drugs, initial encounter: Secondary | ICD-10-CM | POA: Diagnosis present

## 2016-11-03 DIAGNOSIS — Z91120 Patient's intentional underdosing of medication regimen due to financial hardship: Secondary | ICD-10-CM

## 2016-11-03 DIAGNOSIS — N179 Acute kidney failure, unspecified: Secondary | ICD-10-CM | POA: Diagnosis present

## 2016-11-03 DIAGNOSIS — E1110 Type 2 diabetes mellitus with ketoacidosis without coma: Secondary | ICD-10-CM | POA: Diagnosis present

## 2016-11-03 DIAGNOSIS — I10 Essential (primary) hypertension: Secondary | ICD-10-CM | POA: Diagnosis present

## 2016-11-03 DIAGNOSIS — R112 Nausea with vomiting, unspecified: Secondary | ICD-10-CM

## 2016-11-03 DIAGNOSIS — Y92009 Unspecified place in unspecified non-institutional (private) residence as the place of occurrence of the external cause: Secondary | ICD-10-CM | POA: Diagnosis not present

## 2016-11-03 DIAGNOSIS — Z91013 Allergy to seafood: Secondary | ICD-10-CM | POA: Diagnosis not present

## 2016-11-03 DIAGNOSIS — F329 Major depressive disorder, single episode, unspecified: Secondary | ICD-10-CM | POA: Diagnosis present

## 2016-11-03 DIAGNOSIS — E1010 Type 1 diabetes mellitus with ketoacidosis without coma: Secondary | ICD-10-CM | POA: Diagnosis not present

## 2016-11-03 DIAGNOSIS — Z794 Long term (current) use of insulin: Secondary | ICD-10-CM | POA: Diagnosis not present

## 2016-11-03 DIAGNOSIS — Z880 Allergy status to penicillin: Secondary | ICD-10-CM | POA: Diagnosis not present

## 2016-11-03 DIAGNOSIS — Z87891 Personal history of nicotine dependence: Secondary | ICD-10-CM

## 2016-11-03 DIAGNOSIS — E1310 Other specified diabetes mellitus with ketoacidosis without coma: Secondary | ICD-10-CM

## 2016-11-03 LAB — BASIC METABOLIC PANEL
ANION GAP: 19 — AB (ref 5–15)
ANION GAP: 11 (ref 5–15)
BUN: 18 mg/dL (ref 6–20)
BUN: 15 mg/dL (ref 6–20)
CHLORIDE: 98 mmol/L — AB (ref 101–111)
CHLORIDE: 107 mmol/L (ref 101–111)
CO2: 15 mmol/L — AB (ref 22–32)
CO2: 19 mmol/L — AB (ref 22–32)
CREATININE: 1.21 mg/dL — AB (ref 0.44–1.00)
Calcium: 9.1 mg/dL (ref 8.9–10.3)
Calcium: 8.5 mg/dL — ABNORMAL LOW (ref 8.9–10.3)
Creatinine, Ser: 1.06 mg/dL — ABNORMAL HIGH (ref 0.44–1.00)
GFR calc Af Amer: >60 mL/min (ref >60)
GFR calc non Af Amer: 58 mL/min — ABNORMAL LOW (ref >60)
GLUCOSE: 172 mg/dL — AB (ref 65–99)
Glucose, Bld: 591 mg/dL (ref 65–99)
Potassium: 5.4 mmol/L — ABNORMAL HIGH (ref 3.5–5.1)
Potassium: 4.3 mmol/L (ref 3.5–5.1)
Sodium: 132 mmol/L — ABNORMAL LOW (ref 135–145)
Sodium: 137 mmol/L (ref 135–145)

## 2016-11-03 LAB — HEPATIC FUNCTION PANEL
ALBUMIN: 3.9 g/dL (ref 3.5–5.0)
ALT: 29 U/L (ref 14–54)
AST: 43 U/L — ABNORMAL HIGH (ref 15–41)
Alkaline Phosphatase: 87 U/L (ref 38–126)
Bilirubin, Direct: 0.1 mg/dL (ref 0.1–0.5)
Indirect Bilirubin: 1.6 mg/dL — ABNORMAL HIGH (ref 0.3–0.9)
TOTAL PROTEIN: 7.7 g/dL (ref 6.5–8.1)
Total Bilirubin: 1.7 mg/dL — ABNORMAL HIGH (ref 0.3–1.2)

## 2016-11-03 LAB — LIPASE, BLOOD: LIPASE: 15 U/L (ref 11–51)

## 2016-11-03 LAB — BLOOD GAS, VENOUS
PATIENT TEMPERATURE: 37
pCO2, Ven: 31 mmHg — ABNORMAL LOW (ref 44.0–60.0)
pH, Ven: 7.2 — ABNORMAL LOW (ref 7.250–7.430)
pO2, Ven: 54 mmHg — ABNORMAL HIGH (ref 32.0–45.0)

## 2016-11-03 LAB — URINALYSIS, COMPLETE (UACMP) WITH MICROSCOPIC
BILIRUBIN URINE: NEGATIVE
KETONES UR: 80 mg/dL — AB
LEUKOCYTES UA: NEGATIVE
Nitrite: NEGATIVE
PH: 6 (ref 5.0–8.0)
Protein, ur: 30 mg/dL — AB
Specific Gravity, Urine: 1.021 (ref 1.005–1.030)

## 2016-11-03 LAB — GLUCOSE, CAPILLARY
GLUCOSE-CAPILLARY: 515 mg/dL — AB (ref 65–99)
GLUCOSE-CAPILLARY: 187 mg/dL — AB (ref 65–99)
GLUCOSE-CAPILLARY: 155 mg/dL — AB (ref 65–99)
Glucose-Capillary: 568 mg/dL (ref 65–99)
Glucose-Capillary: 550 mg/dL (ref 65–99)
Glucose-Capillary: 322 mg/dL — ABNORMAL HIGH (ref 65–99)
Glucose-Capillary: 153 mg/dL — ABNORMAL HIGH (ref 65–99)
Glucose-Capillary: 156 mg/dL — ABNORMAL HIGH (ref 65–99)
Glucose-Capillary: 167 mg/dL — ABNORMAL HIGH (ref 65–99)
Glucose-Capillary: 148 mg/dL — ABNORMAL HIGH (ref 65–99)

## 2016-11-03 LAB — CBC
HCT: 43.2 % (ref 35.0–47.0)
Hemoglobin: 14 g/dL (ref 12.0–16.0)
MCH: 29.4 pg (ref 26.0–34.0)
MCHC: 32.5 g/dL (ref 32.0–36.0)
MCV: 90.5 fL (ref 80.0–100.0)
PLATELETS: 306 10*3/uL (ref 150–440)
RBC: 4.77 MIL/uL (ref 3.80–5.20)
RDW: 16.9 % — ABNORMAL HIGH (ref 11.5–14.5)
WBC: 15.7 10*3/uL — AB (ref 3.6–11.0)

## 2016-11-03 LAB — MAGNESIUM: Magnesium: 2 mg/dL (ref 1.7–2.4)

## 2016-11-03 LAB — PHOSPHORUS: Phosphorus: 3 mg/dL (ref 2.5–4.6)

## 2016-11-03 LAB — MRSA PCR SCREENING: MRSA by PCR: NEGATIVE

## 2016-11-03 MED ORDER — PANTOPRAZOLE SODIUM 40 MG IV SOLR
40.0000 mg | Freq: Two times a day (BID) | INTRAVENOUS | Status: DC
  Administered 2016-11-03 – 2016-11-04 (×2): 40 mg via INTRAVENOUS
  Filled 2016-11-03 (×2): qty 40

## 2016-11-03 MED ORDER — INSULIN ASPART 100 UNIT/ML ~~LOC~~ SOLN: 0.0000 [IU] | Freq: Every day | SUBCUTANEOUS | Status: DC

## 2016-11-03 MED ORDER — DEXTROSE-NACL 5-0.45 % IV SOLN: INTRAVENOUS | Status: DC

## 2016-11-03 MED ORDER — ACETAMINOPHEN 650 MG RE SUPP: 650.0000 mg | Freq: Four times a day (QID) | RECTAL | Status: DC | PRN

## 2016-11-03 MED ORDER — ALUM & MAG HYDROXIDE-SIMETH 200-200-20 MG/5ML PO SUSP
15.0000 mL | ORAL | Status: DC | PRN
  Administered 2016-11-03: 15 mL via ORAL
  Filled 2016-11-03: qty 30

## 2016-11-03 MED ORDER — SODIUM CHLORIDE 0.9 % IV SOLN
INTRAVENOUS | Status: DC
  Filled 2016-11-03: qty 2.5

## 2016-11-03 MED ORDER — INSULIN REGULAR HUMAN 100 UNIT/ML IJ SOLN
INTRAMUSCULAR | Status: DC
  Administered 2016-11-03: 4.9 [IU]/h via INTRAVENOUS
  Filled 2016-11-03: qty 2.5

## 2016-11-03 MED ORDER — ACETAMINOPHEN 325 MG PO TABS
650.0000 mg | ORAL_TABLET | Freq: Four times a day (QID) | ORAL | Status: DC | PRN
  Administered 2016-11-03 – 2016-11-04 (×2): 650 mg via ORAL
  Filled 2016-11-03 (×2): qty 2

## 2016-11-03 MED ORDER — SODIUM CHLORIDE 0.9 % IV SOLN
INTRAVENOUS | Status: DC
  Administered 2016-11-03: 14:00:00 via INTRAVENOUS

## 2016-11-03 MED ORDER — INSULIN DETEMIR 100 UNIT/ML ~~LOC~~ SOLN
18.0000 [IU] | SUBCUTANEOUS | Status: DC
  Administered 2016-11-03: 18 [IU] via SUBCUTANEOUS
  Filled 2016-11-03 (×2): qty 0.18

## 2016-11-03 MED ORDER — DEXTROSE-NACL 5-0.45 % IV SOLN
INTRAVENOUS | Status: DC
  Administered 2016-11-03: 17:00:00 via INTRAVENOUS

## 2016-11-03 MED ORDER — ONDANSETRON HCL 4 MG/2ML IJ SOLN: 4.0000 mg | Freq: Four times a day (QID) | INTRAMUSCULAR | Status: DC | PRN

## 2016-11-03 MED ORDER — MORPHINE SULFATE (PF) 4 MG/ML IV SOLN
4.0000 mg | Freq: Once | INTRAVENOUS | Status: AC
  Administered 2016-11-03: 4 mg via INTRAVENOUS
  Filled 2016-11-03: qty 1

## 2016-11-03 MED ORDER — ONDANSETRON HCL 4 MG/2ML IJ SOLN
4.0000 mg | Freq: Once | INTRAMUSCULAR | Status: AC | PRN
  Administered 2016-11-03: 4 mg via INTRAVENOUS
  Filled 2016-11-03: qty 2

## 2016-11-03 MED ORDER — SODIUM CHLORIDE 0.9 % IV BOLUS (SEPSIS)
1000.0000 mL | Freq: Once | INTRAVENOUS | Status: AC
  Administered 2016-11-03: 1000 mL via INTRAVENOUS

## 2016-11-03 MED ORDER — SODIUM CHLORIDE 0.9 % IV SOLN: INTRAVENOUS | Status: DC

## 2016-11-03 MED ORDER — ONDANSETRON HCL 4 MG/2ML IJ SOLN
4.0000 mg | Freq: Once | INTRAMUSCULAR | Status: AC
  Administered 2016-11-03: 4 mg via INTRAVENOUS
  Filled 2016-11-03: qty 2

## 2016-11-03 MED ORDER — INSULIN ASPART 100 UNIT/ML ~~LOC~~ SOLN
0.0000 [IU] | Freq: Three times a day (TID) | SUBCUTANEOUS | Status: DC
  Administered 2016-11-04: 11 [IU] via SUBCUTANEOUS
  Administered 2016-11-04: 3 [IU] via SUBCUTANEOUS
  Filled 2016-11-03: qty 11
  Filled 2016-11-03: qty 3

## 2016-11-03 MED ORDER — DEXTROSE-NACL 5-0.45 % IV SOLN
INTRAVENOUS | Status: DC
  Administered 2016-11-03: 14:00:00 via INTRAVENOUS

## 2016-11-03 MED ORDER — ENOXAPARIN SODIUM 40 MG/0.4ML ~~LOC~~ SOLN
40.0000 mg | SUBCUTANEOUS | Status: DC
  Administered 2016-11-03: 40 mg via SUBCUTANEOUS
  Filled 2016-11-03: qty 0.4

## 2016-11-03 NOTE — Progress Notes (Signed)
eLink Physician-Brief Progress Note Patient Name: Jody LawrenceShonda Chavez DOB: 08/16/1981 MRN: [id number]   Date of Service  11/03/2016  HPI/Events of Note  Admitted DKA. Stable on camera check. No new interventions at this time.   eICU Interventions       Intervention Category Evaluation Type: New Patient Evaluation  Kiara Keep S. 11/03/2016, 6:13 PM

## 2016-11-03 NOTE — ED Provider Notes (Addendum)
[facility] Emergency Department Provider Note  ____________________________________________   First MD Initiated Contact with Patient 11/03/16 1138     (approximate)  I have reviewed the triage vital signs and the nursing notes.   HISTORY  Chief Complaint Hyperglycemia   HPI Jody Chavez is a 36 y.o. female with a history of hypertension as well as diabetes who is presenting to the emergency department today with upper abdominal pain as well as nausea and vomiting over the past 3 days. Says the upper abdominal pain is cramping. Does not report any diarrhea. Also says that she hasn't taken her insulin over the past 3 days.Reports a history of a cholecystectomy.   Past Medical History:  Diagnosis Date  . Anxiety   . Depressed   . Diabetes mellitus without complication (HCC)   . Hypertension   . IBS (irritable bowel syndrome)   . Paroxysmal SVT (supraventricular tachycardia) (HCC) WPW  . Wolff-Parkinson-White (WPW) syndrome     There are no active problems to display for this patient.   Past Surgical History:  Procedure Laterality Date  . ABDOMINAL SURGERY    . CARDIAC ELECTROPHYSIOLOGY MAPPING AND ABLATION    . CESAREAN SECTION    . CHOLECYSTECTOMY    . DIAGNOSTIC LAPAROSCOPY WITH REMOVAL OF ECTOPIC PREGNANCY Right 03/04/2016   Procedure: DIAGNOSTIC LAPAROSCOPY WITH right salpingectomy and REMOVAL OF ECTOPIC PREGNANCY, extensive lysis of adhesions;  Surgeon: Suzy Bouchard, MD;  Location: [facility] ORS;  Service: Gynecology;  Laterality: Right;  . ENDOMETRIAL ABLATION    . HEART ABLATION      Prior to Admission medications   Medication Sig Start Date End Date Taking? Authorizing Provider  amLODipine (NORVASC) 10 MG tablet Take 10 mg by mouth daily.   Yes Historical Provider, MD  insulin detemir (LEVEMIR) 100 UNIT/ML injection Inject 26-28 Units into the skin at bedtime.    Yes Historical Provider, MD  insulin lispro (HUMALOG) 100 UNIT/ML  KiwkPen Inject 12-20 Units into the skin 4 (four) times daily as needed. 1 unit per 10 carbs 08/01/16  Yes Historical Provider, MD  lisinopril (PRINIVIL,ZESTRIL) 40 MG tablet Take 40 mg by mouth daily.    Yes Historical Provider, MD  naproxen (NAPROSYN) 500 MG tablet Take 500 mg by mouth 2 (two) times daily as needed.   Yes Historical Provider, MD  sertraline (ZOLOFT) 50 MG tablet Take 50 mg by mouth at bedtime.    Yes Historical Provider, MD  HYDROcodone-acetaminophen (NORCO/VICODIN) 5-325 MG tablet Take 1-2 tablets by mouth every 6 (six) hours as needed for moderate pain.    Historical Provider, MD  ibuprofen (ADVIL,MOTRIN) 600 MG tablet Take 1 tablet (600 mg total) by mouth every 8 (eight) hours as needed. Patient not taking: Reported on 11/03/2016 02/03/16   Phineas Semen, MD  metroNIDAZOLE (FLAGYL) 500 MG tablet Take 1 tablet (500 mg total) by mouth 2 (two) times daily. Patient not taking: Reported on 11/03/2016 10/01/15   Renford Dills, NP  moxifloxacin (VIGAMOX) 0.5 % ophthalmic solution Place 1 drop into both eyes 3 (three) times daily. X 5 days Patient not taking: Reported on 11/03/2016 12/27/15   Payton Mccallum, MD  nitrofurantoin, macrocrystal-monohydrate, (MACROBID) 100 MG capsule Take 1 capsule (100 mg total) by mouth 2 (two) times daily. Patient not taking: Reported on 11/03/2016 10/01/15   Renford Dills, NP  ondansetron (ZOFRAN ODT) 4 MG disintegrating tablet Take 1 tablet (4 mg total) by mouth every 8 (eight) hours as needed for nausea or vomiting. Patient not  taking: Reported on 11/03/2016 10/27/16   Sharman CheekPhillip Stafford, MD  prochlorperazine (COMPAZINE) 10 MG tablet Take 1 tablet (10 mg total) by mouth every 8 (eight) hours as needed (headache). Patient not taking: Reported on 11/03/2016 05/16/16   Phineas SemenGraydon Goodman, MD    Allergies Iodine; Shellfish allergy; and Penicillins  Family History  Problem Relation Age of Onset  . Gallbladder disease Mother     Social History Social History    Substance Use Topics  . Smoking status: Former Games developermoker  . Smokeless tobacco: Never Used  . Alcohol use Yes     Comment: socially    Review of Systems Constitutional: No fever/chills Eyes: No visual changes. ENT: No sore throat. Cardiovascular: Denies chest pain. Respiratory: Denies shortness of breath. Gastrointestinal:  No diarrhea.  No constipation. Genitourinary: Negative for dysuria. Musculoskeletal: Negative for back pain. Skin: Negative for rash. Neurological: Negative for headaches, focal weakness or numbness.  10-point ROS otherwise negative.  ____________________________________________   PHYSICAL EXAM:  VITAL SIGNS: ED Triage Vitals  Enc Vitals Group     BP 11/03/16 1123 128/77     Pulse Rate 11/03/16 1123 (!) 137     Resp 11/03/16 1123 18     Temp 11/03/16 1123 98.1 F (36.7 C)     Temp Source 11/03/16 1123 Oral     SpO2 11/03/16 1123 99 %     Weight 11/03/16 1122 163 lb (73.9 kg)     Height 11/03/16 1122 5\' 3"  (1.6 m)     Head Circumference --      Peak Flow --      Pain Score 11/03/16 1123 0     Pain Loc --      Pain Edu? --      Excl. in GC? --     Constitutional: Alert and oriented. Appears uncomfortable. Eyes: Conjunctivae are normal. PERRL. EOMI. Head: Atraumatic. Nose: No congestion/rhinnorhea. Mouth/Throat: Mucous membranes are moist.   Neck: No stridor.   Cardiovascular: Normal rate, regular rhythm. Grossly normal heart sounds.  Good peripheral circulation. Respiratory: Normal respiratory effort.  No retractions. Lungs CTAB. Gastrointestinal: Soft With mild to moderate epigastric as well as left upper quadrant tenderness palpation. No distention. No CVA tenderness. Musculoskeletal: No lower extremity tenderness nor edema.  No joint effusions. Neurologic:  Normal speech and language. No gross focal neurologic deficits are appreciated.  Skin:  Skin is warm, dry and intact. No rash noted. Psychiatric: Mood and affect are normal. Speech and  behavior are normal.  ____________________________________________   LABS (all labs ordered are listed, but only abnormal results are displayed)  Labs Reviewed  BASIC METABOLIC PANEL - Abnormal; Notable for the following:       Result Value   Sodium 132 (*)    Potassium 5.4 (*)    Chloride 98 (*)    CO2 15 (*)    Glucose, Bld 591 (*)    Creatinine, Ser 1.21 (*)    GFR calc non Af Amer 58 (*)    Anion gap 19 (*)    All other components within normal limits  CBC - Abnormal; Notable for the following:    WBC 15.7 (*)    RDW 16.9 (*)    All other components within normal limits  URINALYSIS, COMPLETE (UACMP) WITH MICROSCOPIC - Abnormal; Notable for the following:    Color, Urine RED (*)    APPearance CLEAR (*)    Glucose, UA >=500 (*)    Hgb urine dipstick LARGE (*)    Ketones,  ur 80 (*)    Protein, ur 30 (*)    Bacteria, UA RARE (*)    Squamous Epithelial / LPF 0-5 (*)    All other components within normal limits  GLUCOSE, CAPILLARY - Abnormal; Notable for the following:    Glucose-Capillary 568 (*)    All other components within normal limits  BLOOD GAS, VENOUS - Abnormal; Notable for the following:    pH, Ven 7.20 (*)    pCO2, Ven 31 (*)    pO2, Ven 54.0 (*)    All other components within normal limits  HEPATIC FUNCTION PANEL  LIPASE, BLOOD  CBG MONITORING, ED   ____________________________________________  EKG   ____________________________________________  RADIOLOGY   ____________________________________________   PROCEDURES  Procedure(s) performed:   Procedures  Critical Care performed:   ____________________________________________   INITIAL IMPRESSION / ASSESSMENT AND PLAN / ED COURSE  Pertinent labs & imaging results that were available during my care of the patient were reviewed by me and considered in my medical decision making (see chart for details).    Clinical Course    ----------------------------------------- 1:25 PM on  11/03/2016 -----------------------------------------  Patient with lab results indicating diabetic ketoacidosis. Will be admitted to the hospital for further management. At this time giving fluids as well as insulin drip ordered. Nausea vomiting abdominal pain likely related to the diabetic ketoacidosis.  ____________________________________________   FINAL CLINICAL IMPRESSION(S) / ED DIAGNOSES  Diabetic ketoacidosis. Epigastric abdominal pain with nausea and vomiting.    NEW MEDICATIONS STARTED DURING THIS VISIT:  New Prescriptions   No medications on file     Note:  This document was prepared using Dragon voice recognition software and may include unintentional dictation errors.    Myrna Blazer, MD 11/03/16 1327  CRITICAL CARE Performed by: Arelia Longest   Total critical care time: 35 minutes  Critical care time was exclusive of separately billable procedures and treating other patients.  Critical care was necessary to treat or prevent imminent or life-threatening deterioration.  Critical care was time spent personally by me on the following activities: development of treatment plan with patient and/or surrogate as well as nursing, discussions with consultants, evaluation of patient's response to treatment, examination of patient, obtaining history from patient or surrogate, ordering and performing treatments and interventions, ordering and review of laboratory studies, ordering and review of radiographic studies, pulse oximetry and re-evaluation of patient's condition.     Myrna Blazer, MD 11/03/16 [phone]

## 2016-11-03 NOTE — ED Notes (Signed)
Vomited large amount of clear emesis on floor.

## 2016-11-03 NOTE — H&P (Signed)
[facility] [place] [facility]   PATIENT NAME: Jody LawrenceShonda Chavez    MR#:  [id number]  DATE OF BIRTH:  12-Mar-1981  DATE OF ADMISSION:  11/03/2016  PRIMARY CARE PHYSICIAN: No PCP Per Patient   REQUESTING/REFERRING PHYSICIAN: Dr. Langston MaskerShaevitz  CHIEF COMPLAINT: Nausea, vomiting    Chief Complaint  Patient presents with  . Hyperglycemia    HISTORY OF PRESENT ILLNESS:  Jody Chavez  is a 36 y.o. female with a known history of For diabetes mellitus supposed to be on insulin pump but not able to afford so started on Levemir, Humalog recently. Patient not taking insulin Levemir or Humalog for the past 3 days and she none ran out of them and not able to afford because of the cost. Patient says that she has to pay around $2000 a month so says that [facility] starting of this year. Having nausea, vomiting since 3 days associated with dizziness today. Admitted for DKA. A 7.2, anion gap 29, blood sugar more than 500. Admitted Her to intensive care unit for insulin drip.  P AST MEDICAL HISTORY:   Past Medical History:  Diagnosis Date  . Anxiety   . Depressed   . Diabetes mellitus without complication (HCC)   . Hypertension   . IBS (irritable bowel syndrome)   . Paroxysmal SVT (supraventricular tachycardia) (HCC) WPW  . Wolff-Parkinson-White (WPW) syndrome     PAST SURGICAL HISTOIRY:   Past Surgical History:  Procedure Laterality Date  . ABDOMINAL SURGERY    . CARDIAC ELECTROPHYSIOLOGY MAPPING AND ABLATION    . CESAREAN SECTION    . CHOLECYSTECTOMY    . DIAGNOSTIC LAPAROSCOPY WITH REMOVAL OF ECTOPIC PREGNANCY Right 03/04/2016   Procedure: DIAGNOSTIC LAPAROSCOPY WITH right salpingectomy and REMOVAL OF ECTOPIC PREGNANCY, extensive lysis of adhesions;  Surgeon: Suzy Bouchardhomas J Schermerhorn, MD;  Location: [facility] ORS;  Service: Gynecology;  Laterality: Right;  . ENDOMETRIAL ABLATION    . HEART ABLATION      SOCIAL HISTORY:    Social History  Substance Use Topics  . Smoking status: Former Games developermoker  . Smokeless tobacco: Never Used  . Alcohol use Yes     Comment: socially    FAMILY HISTORY:   Family History  Problem Relation Age of Onset  . Gallbladder disease Mother     DRUG ALLERGIES:   Allergies  Allergen Reactions  . Iodine Anaphylaxis  . Shellfish Allergy Anaphylaxis  . Penicillins Hives and Other (See Comments)    Has patient had a PCN reaction causing immediate rash, facial/tongue/throat swelling, SOB or lightheadedness with hypotension: No Has patient had a PCN reaction causing severe rash involving mucus membranes or skin necrosis: No Has patient had a PCN reaction that required hospitalization No Has patient had a PCN reaction occurring within the last 10 years: No If all of the above answers are "NO", then may proceed with Cephalosporin use.    REVIEW OF SYSTEMS:  CONSTITUTIONAL: No fever, fatigue or weakness.  EYES: No blurred or double vision.  EARS, NOSE, AND THROAT: No tinnitus or ear pain.  RESPIRATORY: No cough, shortness of breath, wheezing or hemoptysis.  CARDIOVASCULAR: No chest pain, orthopnea, edema.  GASTROINTESTINAL: No nausea, vomiting, diarrhea or abdominal pain.  GENITOURINARY: No dysuria, hematuria.  ENDOCRINE: No polyuria, nocturia,  HEMATOLOGY: No anemia, easy bruising or bleeding SKIN: No rash or lesion. MUSCULOSKELETAL: No joint pain or arthritis.   NEUROLOGIC: No tingling, numbness, weakness.  PSYCHIATRY: No anxiety or  depression.   MEDICATIONS AT HOME:   Prior to Admission medications   Medication Sig Start Date End Date Taking? Authorizing Provider  amLODipine (NORVASC) 10 MG tablet Take 10 mg by mouth daily.   Yes Historical Provider, MD  insulin detemir (LEVEMIR) 100 UNIT/ML injection Inject 26-28 Units into the skin at bedtime.    Yes Historical Provider, MD  insulin lispro (HUMALOG) 100 UNIT/ML KiwkPen Inject 12-20 Units into the skin 4 (four) times  daily as needed. 1 unit per 10 carbs 08/01/16  Yes Historical Provider, MD  lisinopril (PRINIVIL,ZESTRIL) 40 MG tablet Take 40 mg by mouth daily.    Yes Historical Provider, MD  naproxen (NAPROSYN) 500 MG tablet Take 500 mg by mouth 2 (two) times daily as needed.   Yes Historical Provider, MD  sertraline (ZOLOFT) 50 MG tablet Take 50 mg by mouth at bedtime.    Yes Historical Provider, MD  HYDROcodone-acetaminophen (NORCO/VICODIN) 5-325 MG tablet Take 1-2 tablets by mouth every 6 (six) hours as needed for moderate pain.    Historical Provider, MD  ibuprofen (ADVIL,MOTRIN) 600 MG tablet Take 1 tablet (600 mg total) by mouth every 8 (eight) hours as needed. Patient not taking: Reported on 11/03/2016 02/03/16   Phineas Semen, MD  metroNIDAZOLE (FLAGYL) 500 MG tablet Take 1 tablet (500 mg total) by mouth 2 (two) times daily. Patient not taking: Reported on 11/03/2016 10/01/15   Renford Dills, NP  moxifloxacin (VIGAMOX) 0.5 % ophthalmic solution Place 1 drop into both eyes 3 (three) times daily. X 5 days Patient not taking: Reported on 11/03/2016 12/27/15   Payton Mccallum, MD  nitrofurantoin, macrocrystal-monohydrate, (MACROBID) 100 MG capsule Take 1 capsule (100 mg total) by mouth 2 (two) times daily. Patient not taking: Reported on 11/03/2016 10/01/15   Renford Dills, NP  ondansetron (ZOFRAN ODT) 4 MG disintegrating tablet Take 1 tablet (4 mg total) by mouth every 8 (eight) hours as needed for nausea or vomiting. Patient not taking: Reported on 11/03/2016 10/27/16   Sharman Cheek, MD  prochlorperazine (COMPAZINE) 10 MG tablet Take 1 tablet (10 mg total) by mouth every 8 (eight) hours as needed (headache). Patient not taking: Reported on 11/03/2016 05/16/16   Phineas Semen, MD      VITAL SIGNS:  Blood pressure 118/88, pulse (!) 135, temperature 98.1 F (36.7 C), temperature source Oral, resp. rate 20, height 5\' 3"  (1.6 m), weight 73.9 kg (163 lb), last menstrual period 11/02/2016, SpO2 100 %.  PHYSICAL  EXAMINATION:  GENERAL:  36 y.o.-year-old patient lying in the bed with no acute distress. Clinically dry mucous membranes  EYES: Pupils equal, round, reactive to light and accommodation. No scleral icterus. Extraocular muscles intact.  HEENT: Head atraumatic, normocephalic. Oropharynx and nasopharynx clear.  NECK:  Supple, no jugular venous distention. No thyroid enlargement, no tenderness.  LUNGS: Normal breath sounds bilaterally, no wheezing, rales,rhonchi or crepitation. No use of accessory muscles of respiration.  CARDIOVASCULAR: S1, S2 normal. No murmurs, rubs, or gallops.  ABD;Slight epigastric tenderness present.nondistended. Bowel sounds present. No organomegaly or mass.  EXTREMITIES: No pedal edema, cyanosis, or clubbing.  NEUROLOGIC: Cranial nerves II through XII are intact. Muscle strength 5/5 in all extremities. Sensation intact. Gait not checked.  PSYCHIATRIC: The patient is alert and oriented x 3.  SKIN: No obvious rash, lesion, or ulcer.   LABORATORY PANEL:   CBC  Recent Labs Lab 11/03/16 1127  WBC 15.7*  HGB 14.0  HCT 43.2  PLT 306   ------------------------------------------------------------------------------------------------------------------  Chemistries   Recent Labs  Lab 11/03/16 1127  NA 132*  K 5.4*  CL 98*  CO2 15*  GLUCOSE 591*  BUN 18  CREATININE 1.21*  CALCIUM 9.1  AST 43*  ALT 29  ALKPHOS 87  BILITOT 1.7*   ------------------------------------------------------------------------------------------------------------------  Cardiac Enzymes No results for input(s): TROPONINI in the last 168 hours. ------------------------------------------------------------------------------------------------------------------  RADIOLOGY:  No results found.  EKG:   Orders placed or performed during the hospital encounter of 10/27/16  . EKG 12-Lead  . EKG 12-Lead  . ED EKG  . ED EKG    IMPRESSION AND PLAN:   36 year old female patient with type  1 diabetes mellitus came in because of nausea, vomiting with elevated blood sugars, with an anion gap of 29, venous blood gas showed pH 7.2, evidence of DKA.Marland Kitchen  Continue insulin drip, aggressive hydration, admitted to intensive care unit. DKA precipitated by lack of insulin. Consult social worker, diabetes coordinator to figure out payment plans so that she can afford her Levemir, Humalog.  #2. Intractable nausea, vomiting due to DKA'IV Zofran, IV PPIs. #3 acute renal failure due to DKA: Continue IV fluids . slightly elevated white count secondary to reactive leukocytosis, or evidence of infection.   All the records are reviewed and case discussed with ED provider. Management plans discussed with the patient, family and they are in agreement.  CODE STATUS: full  TOTAL TIME TAKING CARE OF THIS PATIENT: . Alphonsus Sias M.D on 11/03/2016 at 2:40 PM  Between 7am to 6pm - Pager - [phone]  After 6pm go to [email or web address] - password [email or web address] [facility]  [place] [facility]  Office  [phone]  CC: Primary care physician; No PCP Per Patient  Note: This dictation was prepared with Dragon dictation along with smaller phrase technology. Any transcriptional errors that result from this process are unintentional.

## 2016-11-03 NOTE — Progress Notes (Signed)
Inpatient Diabetes Program Recommendations  AACE/ADA: New Consensus Statement on Inpatient Glycemic Control (2015)  Target Ranges:  Prepandial:   less than 140 mg/dL      Peak postprandial:   less than 180 mg/dL (1-2 hours)      Critically ill patients:  140 - 180 mg/dL   Results for Jody Chavez, Jody Chavez (MRN [id number]) as of 11/03/2016 14:39  Ref. Range 11/03/2016 11:27  Sodium Latest Ref Range: 135 - 145 mmol/L 132 (L)  Potassium Latest Ref Range: 3.5 - 5.1 mmol/L 5.4 (H)  Chloride Latest Ref Range: 101 - 111 mmol/L 98 (L)  CO2 Latest Ref Range: 22 - 32 mmol/L 15 (L)  Glucose Latest Ref Range: 65 - 99 mg/dL 875591 (HH)  BUN Latest Ref Range: 6 - 20 mg/dL 18  Creatinine Latest Ref Range: 0.44 - 1.00 mg/dL 6.431.21 (H)  Calcium Latest Ref Range: 8.9 - 10.3 mg/dL 9.1  Albumin Latest Ref Range: 3.5 - 5.0 g/dL 3.9  Alkaline Phosphatase Latest Ref Range: 38 - 126 U/L 87  ALT Latest Ref Range: 14 - 54 U/L 29  Anion gap Latest Ref Range: 5 - 15  19 (H)    Currently in ED with elevated glucose, Abd Pain, Nausea, Vomiting.  History: Type 1 DM  Home Meds: Lantus 24 units QHS           Humalog 1 unit for every 10 grams Carbohydrates           Humalog 1 unit for every 40 mg/dl above target CBG 329140 mg/dl  Current Orders: IV Insulin drip (started at 2pm today)      Note patient saw her Endocrinologist (Dr. Carlena SaxAnna Solum) on 08/01/16.  At that visit, was instructed to take the following insulin regimen: Lantus 24 units QHS Humalog 1 unit for every 10 grams Carbohydrates Humalog 1 unit for every 40 mg/dl above target CBG 518140 mg/dl   Patient used to use Insulin pump at home.  Stopped Insulin pump b/c pump supplies were not affordable.  Per MD notes from the ED, note that pt stated she had not taken insulin at home for 3 days prior to admission to ED today.     --Will follow patient during hospitalization--  Ambrose FinlandJeannine Johnston Woodroe Vogan RN, MSN, CDE Diabetes Coordinator Inpatient Glycemic Control  Team Team Pager: [phone] (8a-5p)

## 2016-11-03 NOTE — Care Management Note (Signed)
Case Management Note  Patient Details  Name: Jody LawrenceShonda Kanner MRN: [id number] Date of Birth: 1981-03-14  Subjective/Objective:       Spoke to patient per her RN. She says that although she has [employer]BS , the plan she is on has a 1800 dollar deductable before medications  are covered. She was advised to check on the without insurance price and was told that was still$800, which she cannot afford. She does have a regular endocrinologist, Dr .Sammuel BailiffSlolum, whom she saw in November and got scripts from. At this point I have given her a drug discount card, to see if that will help, depending on where she gets her meds fil;led. She will have to talk to either the ER physician or Dr. Sammuel BailiffSlolum about changing what she is on , so she can have something more affordable.             Action/Plan:   Expected Discharge Date:                  Expected Discharge Plan:     In-House Referral:     Discharge planning Services     Post Acute Care Choice:    Choice offered to:     DME Arranged:    DME Agency:     HH Arranged:    HH Agency:     Status of Service:     If discussed at [employer] Length of Stay Meetings, dates discussed:    Additional Comments:  Berna BueCheryl Rafik Koppel, RN 11/03/2016, 12:31 PM

## 2016-11-03 NOTE — ED Triage Notes (Signed)
Patient from home via [facility]. Reports being out of insulin x3 days. Home sugar in the 500s. Patient complaining of abdominal pain, N/V, and headache. CBG reported by EMS 420. Patient alert and oriented.

## 2016-11-03 NOTE — ED Notes (Signed)
Patient ambulated independently to restroom with steady gait. Patient reports increased nausea. MD made aware. Will continue to monitor.

## 2016-11-04 LAB — CBC
HEMATOCRIT: 35.4 % (ref 35.0–47.0)
Hemoglobin: 11.6 g/dL — ABNORMAL LOW (ref 12.0–16.0)
MCH: 29.3 pg (ref 26.0–34.0)
MCHC: 32.9 g/dL (ref 32.0–36.0)
MCV: 89.1 fL (ref 80.0–100.0)
PLATELETS: 236 10*3/uL (ref 150–440)
RBC: 3.97 MIL/uL (ref 3.80–5.20)
RDW: 16.2 % — AB (ref 11.5–14.5)
WBC: 10.7 10*3/uL (ref 3.6–11.0)

## 2016-11-04 LAB — BASIC METABOLIC PANEL
ANION GAP: 6 (ref 5–15)
BUN: 14 mg/dL (ref 6–20)
CALCIUM: 8.2 mg/dL — AB (ref 8.9–10.3)
CO2: 21 mmol/L — AB (ref 22–32)
Chloride: 108 mmol/L (ref 101–111)
Creatinine, Ser: 0.96 mg/dL (ref 0.44–1.00)
GFR calc Af Amer: >60 mL/min (ref >60)
GFR calc non Af Amer: >60 mL/min (ref >60)
GLUCOSE: 236 mg/dL — AB (ref 65–99)
POTASSIUM: 4.4 mmol/L (ref 3.5–5.1)
Sodium: 135 mmol/L (ref 135–145)

## 2016-11-04 LAB — GLUCOSE, CAPILLARY
GLUCOSE-CAPILLARY: 158 mg/dL — AB (ref 65–99)
GLUCOSE-CAPILLARY: 330 mg/dL — AB (ref 65–99)
Glucose-Capillary: 163 mg/dL — ABNORMAL HIGH (ref 65–99)
Glucose-Capillary: 194 mg/dL — ABNORMAL HIGH (ref 65–99)

## 2016-11-04 MED ORDER — SODIUM CHLORIDE 0.9 % IV SOLN
INTRAVENOUS | Status: DC
  Administered 2016-11-04: 01:00:00 via INTRAVENOUS

## 2016-11-04 MED ORDER — INSULIN REGULAR HUMAN 100 UNIT/ML IJ SOLN: 1.0000 [IU] | Freq: Three times a day (TID) | INTRAMUSCULAR | 11 refills | Status: DC

## 2016-11-04 MED ORDER — "INSULIN SYRINGE 31G X 5/16"" 0.3 ML MISC": 1 refills | Status: AC

## 2016-11-04 MED ORDER — INSULIN NPH (HUMAN) (ISOPHANE) 100 UNIT/ML ~~LOC~~ SUSP: 13.0000 [IU] | Freq: Every day | SUBCUTANEOUS | 11 refills | Status: DC

## 2016-11-04 MED ORDER — SODIUM CHLORIDE 0.9 % IJ SOLN
INTRAMUSCULAR | Status: AC
  Administered 2016-11-04: 10 mL
  Filled 2016-11-04: qty 10

## 2016-11-04 NOTE — Discharge Summary (Signed)
[facility] [place] at [facility]   PATIENT NAME: Jody Chavez    MR#:  [id number]  DATE OF BIRTH:  09-17-1981  DATE OF ADMISSION:  11/03/2016 ADMITTING PHYSICIAN: Katha Hamming, MD  DATE OF DISCHARGE: 11/04/2016  PRIMARY CARE PHYSICIAN: No PCP Per Patient    ADMISSION DIAGNOSIS:  Upper abdominal pain [R10.10] Diabetic ketoacidosis without coma associated with other specified diabetes mellitus (HCC) [E13.10] Nausea and vomiting, intractability of vomiting not specified, unspecified vomiting type [R11.2]  DISCHARGE DIAGNOSIS:  Active Problems:   DKA (diabetic ketoacidosis) (HCC)   SECONDARY DIAGNOSIS:   Past Medical History:  Diagnosis Date  . Anxiety   . Depressed   . Diabetes mellitus without complication (HCC)   . Hypertension   . IBS (irritable bowel syndrome)   . Paroxysmal SVT (supraventricular tachycardia) (HCC) WPW  . Wolff-Parkinson-White (WPW) syndrome     HOSPITAL COURSE:   36 year old female with past medical history of diabetes, hypertension, anxiety, depression, paroxysmal SVT who was admitted to the hospital due to acute diabetic ketoacidosis.  1. Acute diabetic ketoacidosis-secondary to noncompliance and patient was not taking her insulin as she could not afford it. -Patient was admitted to the hospital started on IV insulin drip, IV fluids and electrolytes were replaced. -Her anion gap is now closed her blood sugars are stable and she is no longer nauseous and vomiting. She is tolerating a diet well. She was seen by diabetes coordinator and therefore recommended discharging her on NPH and regular insulin based on controlling her carbs.  2. Essential HTN - cont. Lisinopril, Norvasc  3. Depression - cont. Zoloft.   DISCHARGE CONDITIONS:   Stable.   CONSULTS OBTAINED:    DRUG ALLERGIES:   Allergies  Allergen Reactions  . Iodine Anaphylaxis  . Shellfish Allergy Anaphylaxis  . Penicillins Hives and Other (See Comments)     Has patient had a PCN reaction causing immediate rash, facial/tongue/throat swelling, SOB or lightheadedness with hypotension: No Has patient had a PCN reaction causing severe rash involving mucus membranes or skin necrosis: No Has patient had a PCN reaction that required hospitalization No Has patient had a PCN reaction occurring within the last 10 years: No If all of the above answers are "NO", then may proceed with Cephalosporin use.    DISCHARGE MEDICATIONS:   Allergies as of 11/04/2016      Reactions   Iodine Anaphylaxis   Shellfish Allergy Anaphylaxis   Penicillins Hives, Other (See Comments)   Has patient had a PCN reaction causing immediate rash, facial/tongue/throat swelling, SOB or lightheadedness with hypotension: No Has patient had a PCN reaction causing severe rash involving mucus membranes or skin necrosis: No Has patient had a PCN reaction that required hospitalization No Has patient had a PCN reaction occurring within the last 10 years: No If all of the above answers are "NO", then may proceed with Cephalosporin use.      Medication List    STOP taking these medications   HYDROcodone-acetaminophen 5-325 MG tablet Commonly known as:  NORCO/VICODIN   ibuprofen 600 MG tablet Commonly known as:  ADVIL,MOTRIN   insulin lispro 100 UNIT/ML KiwkPen Commonly known as:  HUMALOG   LEVEMIR 100 UNIT/ML injection Generic drug:  insulin detemir   metroNIDAZOLE 500 MG tablet Commonly known as:  FLAGYL   moxifloxacin 0.5 % ophthalmic solution Commonly known as:  VIGAMOX   nitrofurantoin (macrocrystal-monohydrate) 100 MG capsule Commonly known as:  MACROBID   ondansetron 4 MG disintegrating tablet Commonly known as:  ZOFRAN ODT   prochlorperazine 10 MG tablet Commonly known as:  COMPAZINE     TAKE these medications   amLODipine 10 MG tablet Commonly known as:  NORVASC Take 10 mg by mouth daily.   insulin NPH Human 100 UNIT/ML injection Commonly known as:  HUMULIN  N,NOVOLIN N Inject 0.13 mLs (13 Units total) into the skin at bedtime.   insulin regular 250 units/2.10mL (100 units/mL) injection Commonly known as:  HUMULIN R Inject 0.01-0.1 mLs (1-10 Units total) into the skin 3 (three) times daily before meals. 1 units per 10 g of Carb.   INSULIN SYRINGE .3CC/31GX5/16" 31G X 5/16" 0.3 ML Misc 5 times daily as directed.   lisinopril 40 MG tablet Commonly known as:  PRINIVIL,ZESTRIL Take 40 mg by mouth daily.   naproxen 500 MG tablet Commonly known as:  NAPROSYN Take 500 mg by mouth 2 (two) times daily as needed.   sertraline 50 MG tablet Commonly known as:  ZOLOFT Take 50 mg by mouth at bedtime.         DISCHARGE INSTRUCTIONS:   DIET:  Cardiac diet and Diabetic diet  DISCHARGE CONDITION:  Stable  ACTIVITY:  Activity as tolerated  OXYGEN:  Home Oxygen: No.   Oxygen Delivery: room air  DISCHARGE LOCATION:  home   If you experience worsening of your admission symptoms, develop shortness of breath, life threatening emergency, suicidal or homicidal thoughts you must seek medical attention immediately by calling 911 or calling your MD immediately  if symptoms less severe.  You Must read complete instructions/literature along with all the possible adverse reactions/side effects for all the Medicines you take and that have been prescribed to you. Take any new Medicines after you have completely understood and accpet all the possible adverse reactions/side effects.   Please note  You were cared for by a hospitalist during your hospital stay. If you have any questions about your discharge medications or the care you received while you were in the hospital after you are discharged, you can call the unit and asked to speak with the hospitalist on call if the hospitalist that took care of you is not available. Once you are discharged, your primary care physician will handle any further medical issues. Please note that NO REFILLS for any  discharge medications will be authorized once you are discharged, as it is imperative that you return to your primary care physician (or establish a relationship with a primary care physician if you do not have one) for your aftercare needs so that they can reassess your need for medications and monitor your lab values.     Today   AG closed.  BS stable.  Tolerating PO well. Mother at bedside.   VITAL SIGNS:  Blood pressure 108/72, pulse 99, temperature 98.2 F (36.8 C), temperature source Oral, resp. rate 15, height 5\' 3"  (1.6 m), weight 75.2 kg (165 lb 12.6 oz), last menstrual period 11/02/2016, SpO2 99 %.  I/O:   Intake/Output Summary (Last 24 hours) at 11/04/16 1658 Last data filed at 11/04/16 1000  Gross per 24 hour  Intake          1143.53 ml  Output                0 ml  Net          1143.53 ml    PHYSICAL EXAMINATION:  GENERAL:  36 y.o.-year-old patient lying in the bed with no acute distress.  EYES: Pupils equal, round, reactive to light  and accommodation. No scleral icterus. Extraocular muscles intact.  HEENT: Head atraumatic, normocephalic. Oropharynx and nasopharynx clear.  NECK:  Supple, no jugular venous distention. No thyroid enlargement, no tenderness.  LUNGS: Normal breath sounds bilaterally, no wheezing, rales,rhonchi. No use of accessory muscles of respiration.  CARDIOVASCULAR: S1, S2 normal. No murmurs, rubs, or gallops.  ABDOMEN: Soft, non-tender, non-distended. Bowel sounds present. No organomegaly or mass.  EXTREMITIES: No pedal edema, cyanosis, or clubbing.  NEUROLOGIC: Cranial nerves II through XII are intact. No focal motor or sensory defecits b/l.  PSYCHIATRIC: The patient is alert and oriented x 3. Good affect.  SKIN: No obvious rash, lesion, or ulcer.   DATA REVIEW:   CBC  Recent Labs Lab 11/04/16 0550  WBC 10.7  HGB 11.6*  HCT 35.4  PLT 236    Chemistries   Recent Labs Lab 11/03/16 1127 11/03/16 1802 11/04/16 0550  NA 132* 137 135   K 5.4* 4.3 4.4  CL 98* 107 108  CO2 15* 19* 21*  GLUCOSE 591* 172* 236*  BUN 18 15 14   CREATININE 1.21* 1.06* 0.96  CALCIUM 9.1 8.5* 8.2*  MG  --  2.0  --   AST 43*  --   --   ALT 29  --   --   ALKPHOS 87  --   --   BILITOT 1.7*  --   --     Cardiac Enzymes No results for input(s): TROPONINI in the last 168 hours.  Microbiology Results  Results for orders placed or performed during the hospital encounter of 11/03/16  MRSA PCR Screening     Status: None   Collection Time: 11/03/16  5:30 PM  Result Value Ref Range Status   MRSA by PCR NEGATIVE NEGATIVE Final    Comment:        The GeneXpert MRSA Assay (FDA approved for NASAL specimens only), is one component of a comprehensive MRSA colonization surveillance program. It is not intended to diagnose MRSA infection nor to guide or monitor treatment for MRSA infections.     RADIOLOGY:  No results found.    Management plans discussed with the patient, family and they are in agreement.  CODE STATUS:     Code Status Orders        Start     Ordered   11/03/16 1340  Full code  Continuous     11/03/16 1341    Code Status History    Date Active Date Inactive Code Status Order ID Comments User Context   This patient has a current code status but no historical code status.      TOTAL TIME TAKING CARE OF THIS PATIENT: 40 minutes.    Houston SirenSAINANI,Auden Wettstein J M.D on 11/04/2016 at 4:58 PM  Between 7am to 6pm - Pager - [phone]  After 6pm go to [email or web address] - Scientist, research (life sciences)password [email or web address] [facility]  [facility] [place] [facility]  Office  [phone]  CC: Primary care physician; No PCP Per Patient

## 2016-11-04 NOTE — Progress Notes (Addendum)
Inpatient Diabetes Program Recommendations  AACE/ADA: New Consensus Statement on Inpatient Glycemic Control (2015)  Target Ranges:  Prepandial:   less than 140 mg/dL      Peak postprandial:   less than 180 mg/dL (1-2 hours)      Critically ill patients:  140 - 180 mg/dL   Lab Results  Component Value Date   GLUCAP 330 (H) 11/04/2016   HGBA1C 11.3 (H) 06/01/2013   Met with patient at the bedside.  She recently changed jobs and went from paying $200.00/month for insulin to $400.00 per month with a $1300.00 deductible that needs to be paid before the insulin will be 20% covered ($400.00 is 20%). Although she has medicaid, she tells me she no longer should have it because she has a job that pays more and does not qualify. She had been a patient of [facility] in the past so she will follow up with them where she can be placed on a sliding scale payment.  We are going to discharge her home on NPH and R insulin that she can purchase at [employer] (their Reli[employer] brand) for $25.00 per bottle.    She will call her insurance to see if Levemir or Lantus is the preferred insulin and whether Apidra, Novolog or Humalog is the preferred fast acting insulin and have that information available when she see the MD on January 16th.   Although NPH and R are not ideal insulins, she has no other insulin at home and can afford R and N now.  Spoke with Dr. Verdell Carmine regarding recommendations.     Inpatient Diabetes Program Recommendations:     Please write a prescription for  1. NPH insulin 13 units bid      2. R (regular) insulin 1 unit per 10 grams of carbohydrate tid      3. Insulin syringes [phone])  Please write a note for her employer stating she has been in the hospital.  Patient is to check blood sugar qid (has strips at home) and has an appointment with [facility] on January 16.    Gentry Fitz, RN, [facility], [place], CDE Diabetes Coordinator Inpatient Diabetes Program  [phone] (Team  Pager) [phone] ([place]) 11/04/2016 1:29 PM

## 2016-11-04 NOTE — Progress Notes (Signed)
Inpatient Diabetes Program Recommendations  AACE/ADA: New Consensus Statement on Inpatient Glycemic Control (2015)  Target Ranges:  Prepandial:   less than 140 mg/dL      Peak postprandial:   less than 180 mg/dL (1-2 hours)      Critically ill patients:  140 - 180 mg/dL   Lab Results  Component Value Date   GLUCAP 163 (H) 11/04/2016   HGBA1C 11.3 (H) 06/01/2013    Review of Glycemic Control  Results for Jody Chavez, [name] (MRN [id number]) as of 11/04/2016 10:56  Ref. Range 11/03/2016 21:27 11/03/2016 22:33 11/03/2016 23:33 11/04/2016 00:09 11/04/2016 07:22  Glucose-Capillary Latest Ref Range: 65 - 99 mg/dL 956148 (H) 213155 (H) 086194 (H) 158 (H) 163 (H)   History: Type 1 DM  Home Meds: *Note patient saw her Endocrinologist (Dr. Carlena SaxAnna Solum) on 08/01/16.  At that visit, was instructed to take the following insulin regimen:           Lantus 24 units QHS                      Humalog 1 unit for every 10 grams Carbohydrates                      Humalog 1 unit for every 40 mg/dl above target CBG 578140 mg/dl  Current Orders: Novolog 0-15 units tid, Novolog 0-5 units qhs, Levemir 18 units qhs  Patient has Type 1 diabetes and needs mealtime insulin orders.  Please do custom mealtime order 1- 8 units tid with meals  (comment on medication order should read- patient needs 1 unit per 10 grams of carb)  Susette RacerJulie Lanier Millon, RN, BA, [place], CDE Diabetes Coordinator Inpatient Glycemic Control Team [phone] (Team Pager) [phone] [facility] Office) 11/04/2016 12:33 PM

## 2016-11-04 NOTE — Discharge Instructions (Signed)
Diabetes Mellitus and Exercise Exercising regularly is important for your overall health, especially when you have diabetes (diabetes mellitus). Exercising is not only about losing weight. It has many health benefits, such as increasing muscle strength and bone density and reducing body fat and stress. This leads to improved fitness, flexibility, and endurance, all of which result in better overall health. Exercise has additional benefits for people with diabetes, including:  Reducing appetite.  Helping to lower and control blood glucose.  Lowering blood pressure.  Helping to control amounts of fatty substances (lipids) in the blood, such as cholesterol and triglycerides.  Helping the body to respond better to insulin (improving insulin sensitivity).  Reducing how much insulin the body needs.  Decreasing the risk for heart disease by:  Lowering cholesterol and triglyceride levels.  Increasing the levels of good cholesterol.  Lowering blood glucose levels. What is my activity plan? Your health care provider or certified diabetes educator can help you make a plan for the type and frequency of exercise (activity plan) that works for you. Make sure that you:  Do at least 150 minutes of moderate-intensity or vigorous-intensity exercise each week. This could be brisk walking, biking, or water aerobics.  Do stretching and strength exercises, such as yoga or weightlifting, at least 2 times a week.  Spread out your activity over at least 3 days of the week.  Get some form of physical activity every day.  Do not go more than 2 days in a row without some kind of physical activity.  Avoid being inactive for more than 90 minutes at a time. Take frequent breaks to walk or stretch.  Choose a type of exercise or activity that you enjoy, and set realistic goals.  Start slowly, and gradually increase the intensity of your exercise over time. What do I need to know about managing my  diabetes?  Check your blood glucose before and after exercising.  If your blood glucose is higher than 240 mg/dL (13.3 mmol/L) before you exercise, check your urine for ketones. If you have ketones in your urine, do not exercise until your blood glucose returns to normal.  Know the symptoms of low blood glucose (hypoglycemia) and how to treat it. Your risk for hypoglycemia increases during and after exercise. Common symptoms of hypoglycemia can include:  Hunger.  Anxiety.  Sweating and feeling clammy.  Confusion.  Dizziness or feeling light-headed.  Increased heart rate or palpitations.  Blurry vision.  Tingling or numbness around the mouth, lips, or tongue.  Tremors or shakes.  Irritability.  Keep a rapid-acting carbohydrate snack available before, during, and after exercise to help prevent or treat hypoglycemia.  Avoid injecting insulin into areas of the body that are going to be exercised. For example, avoid injecting insulin into:  The arms, when playing tennis.  The legs, when jogging.  Keep records of your exercise habits. Doing this can help you and your health care provider adjust your diabetes management plan as needed. Write down:  Food that you eat before and after you exercise.  Blood glucose levels before and after you exercise.  The type and amount of exercise you have done.  When your insulin is expected to peak, if you use insulin. Avoid exercising at times when your insulin is peaking.  When you start a new exercise or activity, work with your health care provider to make sure the activity is safe for you, and to adjust your insulin, medicines, or food intake as needed.  Drink plenty   of water while you exercise to prevent dehydration or heat stroke. Drink enough fluid to keep your urine clear or pale yellow. This information is not intended to replace advice given to you by your health care provider. Make sure you discuss any questions you have with  your health care provider. Document Released: 01/07/2004 Document Revised: 05/06/2016 Document Reviewed: 03/28/2016 Elsevier Interactive Patient Education  2017 Elsevier Inc.  

## 2016-11-04 NOTE — Progress Notes (Signed)
Patient discharged home. Given her scrips, DC instructions, and follow up appt, understanding verbalized, spoke with her about her diet and the importance of taking her insulin, Diabetes Coordinator spoke with her at length, as did RNCM.  IVs DCs, bleeding controlled, Patient left hospital with her mother.

## 2016-11-04 NOTE — Care Management (Addendum)
It is noted that patient has active Medicaid (along with private health insurance). I contacted [employer] to confirm Medicaid plan [phone]. She has drug coverage through Medicaid. Medicaid will cover most Brand name medications.  Co-pays $32 per prescription. ($12.59 Phenergan.  $32.49 Diflucan generic. $15.69 Flagyl.  Diabetes supplies were denied per record. Humalog 100 U pen $640.40).  She needs to get in contact with her case worker per [employer]. It's being run under her private insurance which appears to have drug coverage also. I personally spoke with patient and her mother and she will follow up with her case worker at [facility].

## 2017-03-04 ENCOUNTER — Emergency Department
Admission: EM | Admit: 2017-03-04 | Discharge: 2017-03-04 | Disposition: A | Discharge status: Home / Self Care | Payer: Medicaid Other | Attending: Emergency Medicine | Admitting: Emergency Medicine

## 2017-03-04 ENCOUNTER — Encounter: Payer: Self-pay | Admitting: Emergency Medicine

## 2017-03-04 ENCOUNTER — Emergency Department: Payer: Medicaid Other

## 2017-03-04 DIAGNOSIS — Z87891 Personal history of nicotine dependence: Secondary | ICD-10-CM | POA: Insufficient documentation

## 2017-03-04 DIAGNOSIS — I10 Essential (primary) hypertension: Secondary | ICD-10-CM | POA: Diagnosis not present

## 2017-03-04 DIAGNOSIS — E1065 Type 1 diabetes mellitus with hyperglycemia: Secondary | ICD-10-CM | POA: Diagnosis not present

## 2017-03-04 DIAGNOSIS — R0602 Shortness of breath: Secondary | ICD-10-CM | POA: Diagnosis not present

## 2017-03-04 LAB — CBC
HEMATOCRIT: 39 % (ref 35.0–47.0)
Hemoglobin: 13.2 g/dL (ref 12.0–16.0)
MCH: 30.3 pg (ref 26.0–34.0)
MCHC: 33.9 g/dL (ref 32.0–36.0)
MCV: 89.4 fL (ref 80.0–100.0)
Platelets: 238 10*3/uL (ref 150–440)
RBC: 4.37 MIL/uL (ref 3.80–5.20)
RDW: 14.2 % (ref 11.5–14.5)
WBC: 9.6 10*3/uL (ref 3.6–11.0)

## 2017-03-04 LAB — BLOOD GAS, VENOUS
ACID-BASE DEFICIT: 1.7 mmol/L (ref 0.0–2.0)
BICARBONATE: 24.3 mmol/L (ref 20.0–28.0)
O2 SAT: 57 %
PCO2 VEN: 45 mmHg (ref 44.0–60.0)
PO2 VEN: 32 mmHg (ref 32.0–45.0)
Patient temperature: 37
pH, Ven: 7.34 (ref 7.250–7.430)

## 2017-03-04 LAB — HEPATIC FUNCTION PANEL
ALK PHOS: 64 U/L (ref 38–126)
ALT: 17 U/L (ref 14–54)
AST: 28 U/L (ref 15–41)
Albumin: 4 g/dL (ref 3.5–5.0)
BILIRUBIN DIRECT: 0.1 mg/dL (ref 0.1–0.5)
BILIRUBIN TOTAL: 0.9 mg/dL (ref 0.3–1.2)
Indirect Bilirubin: 0.8 mg/dL (ref 0.3–0.9)
Total Protein: 7.3 g/dL (ref 6.5–8.1)

## 2017-03-04 LAB — BASIC METABOLIC PANEL
Anion gap: 11 (ref 5–15)
BUN: 14 mg/dL (ref 6–20)
CHLORIDE: 95 mmol/L — AB (ref 101–111)
CO2: 22 mmol/L (ref 22–32)
Calcium: 9.1 mg/dL (ref 8.9–10.3)
Creatinine, Ser: 0.93 mg/dL (ref 0.44–1.00)
GFR calc Af Amer: >60 mL/min (ref >60)
GFR calc non Af Amer: >60 mL/min (ref >60)
GLUCOSE: 347 mg/dL — AB (ref 65–99)
POTASSIUM: 3.9 mmol/L (ref 3.5–5.1)
Sodium: 128 mmol/L — ABNORMAL LOW (ref 135–145)

## 2017-03-04 LAB — POCT PREGNANCY, URINE: PREG TEST UR: NEGATIVE

## 2017-03-04 LAB — ETHANOL: Alcohol, Ethyl (B): <5 mg/dL (ref <5)

## 2017-03-04 LAB — URINALYSIS, COMPLETE (UACMP) WITH MICROSCOPIC
BACTERIA UA: NONE SEEN
Bilirubin Urine: NEGATIVE
Glucose, UA: >=500 mg/dL — AB
Ketones, ur: 20 mg/dL — AB
Leukocytes, UA: NEGATIVE
Nitrite: NEGATIVE
Protein, ur: NEGATIVE mg/dL
SPECIFIC GRAVITY, URINE: 1.02 (ref 1.005–1.030)
pH: 5 (ref 5.0–8.0)

## 2017-03-04 LAB — GLUCOSE, CAPILLARY
GLUCOSE-CAPILLARY: 452 mg/dL — AB (ref 65–99)
GLUCOSE-CAPILLARY: 171 mg/dL — AB (ref 65–99)
GLUCOSE-CAPILLARY: 91 mg/dL (ref 65–99)

## 2017-03-04 LAB — LIPASE, BLOOD: Lipase: 18 U/L (ref 11–51)

## 2017-03-04 LAB — BETA-HYDROXYBUTYRIC ACID: Beta-Hydroxybutyric Acid: 1.06 mmol/L — ABNORMAL HIGH (ref 0.05–0.27)

## 2017-03-04 MED ORDER — SODIUM CHLORIDE 0.9 % IV BOLUS (SEPSIS)
1000.0000 mL | Freq: Once | INTRAVENOUS | Status: AC
  Administered 2017-03-04: 1000 mL via INTRAVENOUS

## 2017-03-04 MED ORDER — SODIUM CHLORIDE 0.9 % IV BOLUS (SEPSIS)
1000.0000 mL | INTRAVENOUS | Status: AC
  Administered 2017-03-04: 1000 mL via INTRAVENOUS

## 2017-03-04 MED ORDER — ONDANSETRON HCL 4 MG/2ML IJ SOLN
4.0000 mg | Freq: Once | INTRAMUSCULAR | Status: AC
  Administered 2017-03-04: 4 mg via INTRAVENOUS
  Filled 2017-03-04: qty 2

## 2017-03-04 NOTE — ED Notes (Signed)
MD at bedside. 

## 2017-03-04 NOTE — ED Triage Notes (Signed)
Pt reports nausea and some stomach pain for the past couple of days, states this morning she felt short of breath and nauseated and checked her sugar and it was over 500. Pt is a Type 1 Diabetic and took 22 units of fast acting insulin. Pt states she has not eaten today.  Pt reports polyuria and polyphagia.

## 2017-03-04 NOTE — Discharge Instructions (Signed)

## 2017-03-04 NOTE — ED Provider Notes (Signed)
[facility] Emergency Department Provider Note  ____________________________________________   First MD Initiated Contact with Patient 03/04/17 1104     (approximate)  I have reviewed the triage vital signs and the nursing notes.   HISTORY  Chief Complaint Hyperglycemia    HPI Jody Chavez is a 36 y.o. female who reports that she is a type I diabetic diagnosed a couple of years ago who reports compliance on her insulin regimen who presents for evaluation of several days of nausea, decreased appetite, some mild epigastric aching pain, and some mild shortness of breath.  She reports this feels the same as when she was hospitalized for DKA about 4 months ago.  At that time she was not taking her insulin but she reports that she is this time.  All of her symptoms are moderate in severity but she is concerned because of her history.  She checked her blood glucose at home today and it was greater than 500.  She says she has not been eating and drinking as much as usual recently, and has not had anything to eat for about 14 hours.    She denies any recent infectious symptoms specifically including fever/chills, chest pain, cough, dysuria.  She has had no lower abdominal pain.  The epigastric pain she is experiencing is similar to what she has felt in the past with DKA.  She has experienced some shortness of breath over the last 24 hours but feels that is related to her blood sugar as well and it sounds as if she is describing more rapid breathing and than difficulty breathing.  Nothing in particular is making her symptoms better nor worse and they have been gradual in onset over the last several days.  Past Medical History:  Diagnosis Date  . Anxiety   . Depressed   . Diabetes mellitus without complication (HCC)   . Hypertension   . IBS (irritable bowel syndrome)   . Paroxysmal SVT (supraventricular tachycardia) (HCC) WPW  . Wolff-Parkinson-White (WPW) syndrome      Patient Active Problem List   Diagnosis Date Noted  . DKA (diabetic ketoacidosis) (HCC) 11/03/2016    Past Surgical History:  Procedure Laterality Date  . ABDOMINAL SURGERY    . CARDIAC ELECTROPHYSIOLOGY MAPPING AND ABLATION    . CESAREAN SECTION    . CHOLECYSTECTOMY    . DIAGNOSTIC LAPAROSCOPY WITH REMOVAL OF ECTOPIC PREGNANCY Right 03/04/2016   Procedure: DIAGNOSTIC LAPAROSCOPY WITH right salpingectomy and REMOVAL OF ECTOPIC PREGNANCY, extensive lysis of adhesions;  Surgeon: Suzy Bouchard, MD;  Location: [facility] ORS;  Service: Gynecology;  Laterality: Right;  . ENDOMETRIAL ABLATION    . HEART ABLATION      Prior to Admission medications   Medication Sig Start Date End Date Taking? Authorizing Provider  amLODipine (NORVASC) 10 MG tablet Take 10 mg by mouth daily.    [provider]  insulin NPH Human (HUMULIN N,NOVOLIN N) 100 UNIT/ML injection Inject 0.13 mLs (13 Units total) into the skin at bedtime. 11/04/16   Houston Siren, MD  insulin regular (HUMULIN R) 250 units/2.54mL (100 units/mL) injection Inject 0.01-0.1 mLs (1-10 Units total) into the skin 3 (three) times daily before meals. 1 units per 10 g of Carb. 11/04/16   Houston Siren, MD  Insulin Syringe-Needle U-100 (INSULIN SYRINGE .3CC/31GX5/16") 31G X 5/16" 0.3 ML MISC 5 times daily as directed. 11/04/16   Houston Siren, MD  lisinopril (PRINIVIL,ZESTRIL) 40 MG tablet Take 40 mg by mouth daily.  [provider]  naproxen (NAPROSYN) 500 MG tablet Take 500 mg by mouth 2 (two) times daily as needed.    [provider]  sertraline (ZOLOFT) 50 MG tablet Take 50 mg by mouth at bedtime.     [provider]    Allergies Iodine; Shellfish allergy; and Penicillins  Family History  Problem Relation Age of Onset  . Gallbladder disease Mother     Social History Social History  Substance Use Topics  . Smoking status: Former Games developer  . Smokeless tobacco: Never Used  . Alcohol use Yes      Comment: socially    Review of Systems Constitutional: No fever/chills Eyes: No visual changes. ENT: No sore throat. Cardiovascular: Denies chest pain. Respiratory: Mild shortness of breath or rapid breathing since yesterday. Gastrointestinal: Mild epigastric pain.  Nausea for several days with no vomiting, no diarrhea, no constipation Genitourinary: Negative for dysuria. Musculoskeletal: Mild left flank pain.  No other MSK complaints Integumentary: Negative for rash. Neurological: Negative for headaches, focal weakness or numbness.   ____________________________________________   PHYSICAL EXAM:  VITAL SIGNS: ED Triage Vitals [03/04/17 1052]  Enc Vitals Group     BP (!) 134/99     Pulse Rate (!) 111     Resp 18     Temp 98.3 F (36.8 C)     Temp Source Oral     SpO2 99 %     Weight 170 lb (77.1 kg)     Height 5\' 3"  (1.6 m)     Head Circumference      Peak Flow      Pain Score 0     Pain Loc      Pain Edu?      Excl. in GC?     Constitutional: Alert and oriented. Well appearing and in no acute distress. Eyes: Conjunctivae are normal. PERRL. EOMI. Head: Atraumatic. Nose: No congestion/rhinnorhea. Mouth/Throat: Mucous membranes are dry. Neck: No stridor.  No meningeal signs.   Cardiovascular: Tachycardia, regular rhythm. Good peripheral circulation. Grossly normal heart sounds. Respiratory: Normal respiratory effort.  No retractions. Lungs CTAB. Gastrointestinal: Soft and nontender. No distention.  Musculoskeletal: No lower extremity tenderness nor edema. No gross deformities of extremities.  No CVA tenderness Neurologic:  Normal speech and language. No gross focal neurologic deficits are appreciated.  Skin:  Skin is warm, dry and intact. No rash noted. Psychiatric: Mood and affect are normal. Speech and behavior are normal.  ____________________________________________   LABS (all labs ordered are listed, but only abnormal results are displayed)  Labs  Reviewed  BASIC METABOLIC PANEL - Abnormal; Notable for the following:       Result Value   Sodium 128 (*)    Chloride 95 (*)    Glucose, Bld 347 (*)    All other components within normal limits  URINALYSIS, COMPLETE (UACMP) WITH MICROSCOPIC - Abnormal; Notable for the following:    Color, Urine STRAW (*)    APPearance CLEAR (*)    Glucose, UA >=500 (*)    Hgb urine dipstick LARGE (*)    Ketones, ur 20 (*)    Squamous Epithelial / LPF 0-5 (*)    All other components within normal limits  GLUCOSE, CAPILLARY - Abnormal; Notable for the following:    Glucose-Capillary 452 (*)    All other components within normal limits  BETA-HYDROXYBUTYRIC ACID - Abnormal; Notable for the following:    Beta-Hydroxybutyric Acid 1.06 (*)    All other components within normal limits  GLUCOSE, CAPILLARY - Abnormal; Notable for the following:    Glucose-Capillary 171 (*)    All other components within normal limits  CBC  HEPATIC FUNCTION PANEL  LIPASE, BLOOD  ETHANOL  BLOOD GAS, VENOUS  GLUCOSE, CAPILLARY  CBG MONITORING, ED  POC URINE PREG, ED  CBG MONITORING, ED  POCT PREGNANCY, URINE   ____________________________________________  EKG  ED ECG REPORT I, Keyshon Stein, the attending physician, personally viewed and interpreted this ECG.  Date: 03/04/2017 EKG Time: 11:12 AM Rate: 105 Rhythm: Sinus tachycardia QRS Axis: normal Intervals: normal, possible right atrial enlargement ST/T Wave abnormalities: normal Conduction Disturbances: none Narrative Interpretation: unremarkable  ____________________________________________  RADIOLOGY   Dg Chest 2 View  Result Date: 03/04/2017 CLINICAL DATA:  Nausea and abdominal pain for several days. Shortness of breath and hyperglycemia today. Diabetes. EXAM: CHEST  2 VIEW COMPARISON:  10/27/2016 and 02/03/2016. FINDINGS: The heart size and mediastinal contours are normal. The lungs are clear. There is no pleural effusion or pneumothorax. No acute  osseous findings are identified. IMPRESSION: Stable chest.  No active cardiopulmonary process. Electronically Signed   By: Carey BullocksWilliam  Veazey M.D.   On: 03/04/2017 11:28    ____________________________________________   PROCEDURES  Critical Care performed: No   Procedure(s) performed:   Procedures   ____________________________________________   INITIAL IMPRESSION / ASSESSMENT AND PLAN / ED COURSE  Pertinent labs & imaging results that were available during my care of the patient were reviewed by me and considered in my medical decision making (see chart for details).  The patient is well-appearing and in no acute distress at this time although she is tachycardic at rest.  She has no tachypnea at this time but I suspect that her symptoms of "shortness of breath" has more to do with tachypnea than dyspnea and it is currently not present.  I reviewed the medical records from January and this is very similar to her last presentation with mild DKA.  I will evaluate with a chest x-ray given the report of shortness of breath and the possibility she may have an infection that is causing her to be hyperglycemic/DKA and we will check urinalysis.  Blood work is pending.  I explained to her that if she is not in DKA with may be able to treat her and get her feeling better for outpatient follow-up but otherwise she will need need to come into the hospital.  She understands and agrees with the plan.   Clinical Course as of Mar 05 1515  Sat Mar 04, 2017  1305 Although the patient does have some ketones in her blood and urine, she has a normal anion gap and is not acidotic.  She has some mild hyponatremia but this is likely pseudohyponatremia due to the hyperglycemia.  Her blood sugar has come down considerably after a liter of fluids.  I will give her another liter of fluids and 5 units of regular insulin and then she should be appropriate for discharge and outpatient follow-up.  I gave her the  encouraging results and she is comfortable with the plan.  [CF]  1321 Her blood gluose has dropped down to 171, so I am reluctant to give her extra insulin. We will give another liter of fluids and then discharge for outpatient follow up.  [CF]  1430 Final CBG was 91 after 2nd liter of fluid.  Patient feels much better and agrees with plan for discharge and outpatient follow up.    I gave my usual and customary return  precautions.     [CF]    Clinical Course User Index [CF] Loleta Rose, MD    ____________________________________________  FINAL CLINICAL IMPRESSION(S) / ED DIAGNOSES  Final diagnoses:  Hyperglycemia due to type 1 diabetes mellitus [facility])     MEDICATIONS GIVEN DURING THIS VISIT:  Medications  sodium chloride 0.9 % bolus 1,000 mL (0 mLs Intravenous Stopped 03/04/17 1317)  ondansetron (ZOFRAN) injection 4 mg (4 mg Intravenous Given 03/04/17 1215)  sodium chloride 0.9 % bolus 1,000 mL (0 mLs Intravenous Stopped 03/04/17 1432)     NEW OUTPATIENT MEDICATIONS STARTED DURING THIS VISIT:  Discharge Medication List as of 03/04/2017  1:46 PM      Discharge Medication List as of 03/04/2017  1:46 PM      Discharge Medication List as of 03/04/2017  1:46 PM       Note:  This document was prepared using Dragon voice recognition software and may include unintentional dictation errors.    Loleta Rose, MD 03/04/17 [phone]

## 2017-04-04 ENCOUNTER — Emergency Department
Admission: EM | Admit: 2017-04-04 | Discharge: 2017-04-04 | Disposition: A | Discharge status: Home / Self Care | Payer: Medicaid Other | Attending: Emergency Medicine | Admitting: Emergency Medicine

## 2017-04-04 ENCOUNTER — Encounter: Payer: Self-pay | Admitting: Emergency Medicine

## 2017-04-04 DIAGNOSIS — I10 Essential (primary) hypertension: Secondary | ICD-10-CM | POA: Diagnosis not present

## 2017-04-04 DIAGNOSIS — E1110 Type 2 diabetes mellitus with ketoacidosis without coma: Secondary | ICD-10-CM | POA: Diagnosis not present

## 2017-04-04 DIAGNOSIS — Z794 Long term (current) use of insulin: Secondary | ICD-10-CM | POA: Diagnosis not present

## 2017-04-04 DIAGNOSIS — R110 Nausea: Secondary | ICD-10-CM | POA: Diagnosis present

## 2017-04-04 DIAGNOSIS — E1165 Type 2 diabetes mellitus with hyperglycemia: Secondary | ICD-10-CM | POA: Diagnosis not present

## 2017-04-04 DIAGNOSIS — R739 Hyperglycemia, unspecified: Secondary | ICD-10-CM

## 2017-04-04 DIAGNOSIS — Z79899 Other long term (current) drug therapy: Secondary | ICD-10-CM | POA: Insufficient documentation

## 2017-04-04 LAB — URINALYSIS, COMPLETE (UACMP) WITH MICROSCOPIC
BACTERIA UA: NONE SEEN
Bilirubin Urine: NEGATIVE
Glucose, UA: >=500 mg/dL — AB
HGB URINE DIPSTICK: NEGATIVE
Ketones, ur: 20 mg/dL — AB
Leukocytes, UA: NEGATIVE
NITRITE: NEGATIVE
PH: 6 (ref 5.0–8.0)
Protein, ur: NEGATIVE mg/dL
SPECIFIC GRAVITY, URINE: 1.028 (ref 1.005–1.030)

## 2017-04-04 LAB — COMPREHENSIVE METABOLIC PANEL
ALK PHOS: 72 U/L (ref 38–126)
ALT: 38 U/L (ref 14–54)
AST: 46 U/L — AB (ref 15–41)
Albumin: 4.1 g/dL (ref 3.5–5.0)
Anion gap: 7 (ref 5–15)
BILIRUBIN TOTAL: 0.7 mg/dL (ref 0.3–1.2)
BUN: 12 mg/dL (ref 6–20)
CALCIUM: 9.3 mg/dL (ref 8.9–10.3)
CO2: 29 mmol/L (ref 22–32)
CREATININE: 0.81 mg/dL (ref 0.44–1.00)
Chloride: 100 mmol/L — ABNORMAL LOW (ref 101–111)
GFR calc Af Amer: >60 mL/min (ref >60)
GFR calc non Af Amer: >60 mL/min (ref >60)
GLUCOSE: 315 mg/dL — AB (ref 65–99)
Potassium: 4 mmol/L (ref 3.5–5.1)
Sodium: 136 mmol/L (ref 135–145)
TOTAL PROTEIN: 7.5 g/dL (ref 6.5–8.1)

## 2017-04-04 LAB — GLUCOSE, CAPILLARY
GLUCOSE-CAPILLARY: 37 mg/dL — AB (ref 65–99)
GLUCOSE-CAPILLARY: 65 mg/dL (ref 65–99)
GLUCOSE-CAPILLARY: 101 mg/dL — AB (ref 65–99)

## 2017-04-04 LAB — CBC
HEMATOCRIT: 40.5 % (ref 35.0–47.0)
Hemoglobin: 13.7 g/dL (ref 12.0–16.0)
MCH: 30.1 pg (ref 26.0–34.0)
MCHC: 33.7 g/dL (ref 32.0–36.0)
MCV: 89.1 fL (ref 80.0–100.0)
PLATELETS: 246 10*3/uL (ref 150–440)
RBC: 4.54 MIL/uL (ref 3.80–5.20)
RDW: 14.6 % — AB (ref 11.5–14.5)
WBC: 8.2 10*3/uL (ref 3.6–11.0)

## 2017-04-04 LAB — LIPASE, BLOOD: Lipase: 19 U/L (ref 11–51)

## 2017-04-04 LAB — POCT PREGNANCY, URINE: PREG TEST UR: NEGATIVE

## 2017-04-04 MED ORDER — ONDANSETRON 4 MG PO TBDP
4.0000 mg | ORAL_TABLET | Freq: Once | ORAL | Status: AC
Start: 2017-04-04 — End: 2017-04-04
  Administered 2017-04-04: 4 mg via ORAL

## 2017-04-04 MED ORDER — ONDANSETRON 4 MG PO TBDP
ORAL_TABLET | ORAL | Status: AC
  Filled 2017-04-04: qty 1

## 2017-04-04 MED ORDER — KETOROLAC TROMETHAMINE 30 MG/ML IJ SOLN
30.0000 mg | Freq: Once | INTRAMUSCULAR | Status: AC
  Administered 2017-04-04: 30 mg via INTRAVENOUS
  Filled 2017-04-04: qty 1

## 2017-04-04 MED ORDER — METOCLOPRAMIDE HCL 5 MG/ML IJ SOLN
10.0000 mg | Freq: Once | INTRAMUSCULAR | Status: AC
  Administered 2017-04-04: 10 mg via INTRAVENOUS
  Filled 2017-04-04: qty 2

## 2017-04-04 MED ORDER — SODIUM CHLORIDE 0.9 % IV BOLUS (SEPSIS)
1000.0000 mL | Freq: Once | INTRAVENOUS | Status: AC
  Administered 2017-04-04: 1000 mL via INTRAVENOUS

## 2017-04-04 MED ORDER — ONDANSETRON 4 MG PO TBDP: 4.0000 mg | ORAL_TABLET | Freq: Three times a day (TID) | ORAL | 0 refills | Status: DC | PRN

## 2017-04-04 NOTE — ED Provider Notes (Signed)
[facility] Emergency Department Provider Note  Time seen: 4:20 PM  I have reviewed the triage vital signs and the nursing notes.   HISTORY  Chief Complaint Nausea; Headache; and Hyperglycemia    HPI Jody Chavez is a 36 y.o. female with a past medical history of anxiety, depression, diabetes, hypertension, presents to the emergency department for hyperglycemia, nausea and headache. According to the patient for the past 2 days her blood sugars have been in the 400s. Patient states she is taking her insulin as prescribed but cannot seem to get her blood sugar down. Patient states today she became nauseated with the headache, she believe she is dehydrated so she came to the emergency department. Denies any fever, chest pain or shoulder breathing. Denies abdominal pain states nausea but denies vomiting or diarrhea. Denies dysuria. Her last period was the end of May but states normal for her to have irregular periods. Describes her headache as dull aching pain, moderate in severity.  Past Medical History:  Diagnosis Date  . Anxiety   . Depressed   . Diabetes mellitus without complication (HCC)   . Hypertension   . IBS (irritable bowel syndrome)   . Paroxysmal SVT (supraventricular tachycardia) (HCC) WPW  . Wolff-Parkinson-White (WPW) syndrome     Patient Active Problem List   Diagnosis Date Noted  . DKA (diabetic ketoacidosis) (HCC) 11/03/2016    Past Surgical History:  Procedure Laterality Date  . ABDOMINAL SURGERY    . CARDIAC ELECTROPHYSIOLOGY MAPPING AND ABLATION    . CESAREAN SECTION    . CHOLECYSTECTOMY    . DIAGNOSTIC LAPAROSCOPY WITH REMOVAL OF ECTOPIC PREGNANCY Right 03/04/2016   Procedure: DIAGNOSTIC LAPAROSCOPY WITH right salpingectomy and REMOVAL OF ECTOPIC PREGNANCY, extensive lysis of adhesions;  Surgeon: Suzy Bouchardhomas J Schermerhorn, MD;  Location: [facility] ORS;  Service: Gynecology;  Laterality: Right;  . ENDOMETRIAL ABLATION    . HEART ABLATION       Prior to Admission medications   Medication Sig Start Date End Date Taking? Authorizing Provider  amLODipine (NORVASC) 10 MG tablet Take 10 mg by mouth daily.    [provider]  insulin NPH Human (HUMULIN N,NOVOLIN N) 100 UNIT/ML injection Inject 0.13 mLs (13 Units total) into the skin at bedtime. 11/04/16   Houston SirenSainani, Vivek J, MD  insulin regular (HUMULIN R) 250 units/2.535mL (100 units/mL) injection Inject 0.01-0.1 mLs (1-10 Units total) into the skin 3 (three) times daily before meals. 1 units per 10 g of Carb. 11/04/16   [place] SirenSainani, Vivek J, MD  Insulin Syringe-Needle U-100 (INSULIN SYRINGE .3CC/31GX5/16") 31G X 5/16" 0.3 ML MISC 5 times daily as directed. 11/04/16   [place] SirenSainani, Vivek J, MD  lisinopril (PRINIVIL,ZESTRIL) 40 MG tablet Take 40 mg by mouth daily.     [provider]  naproxen (NAPROSYN) 500 MG tablet Take 500 mg by mouth 2 (two) times daily as needed.    [provider]  sertraline (ZOLOFT) 50 MG tablet Take 50 mg by mouth at bedtime.     [provider]    Allergies  Allergen Reactions  . Iodine Anaphylaxis  . Shellfish Allergy Anaphylaxis  . Penicillins Hives and Other (See Comments)    Has patient had a PCN reaction causing immediate rash, facial/tongue/throat swelling, SOB or lightheadedness with hypotension: No Has patient had a PCN reaction causing severe rash involving mucus membranes or skin necrosis: No Has patient had a PCN reaction that required hospitalization No Has patient had a PCN reaction occurring within the last 10  years: No If all of the above answers are "NO", then may proceed with Cephalosporin use.    Family History  Problem Relation Age of Onset  . Gallbladder disease Mother     Social History Social History  Substance Use Topics  . Smoking status: Former Games developer  . Smokeless tobacco: Never Used  . Alcohol use Yes     Comment: socially    Review of Systems Constitutional: Negative for fever. Eyes: Negative for  visual changes. ENT: Negative for congestion Cardiovascular: Negative for chest pain. Respiratory: Negative for shortness of breath. Gastrointestinal: Negative for abdominal pain. Positive for nausea. Negative for vomiting or diarrhea. Genitourinary: Negative for dysuria. Musculoskeletal: Negative for back pain. Skin: Negative for rash. Neurological: Moderate headache. All other ROS negative  ____________________________________________   PHYSICAL EXAM:  VITAL SIGNS: ED Triage Vitals  Enc Vitals Group     BP 04/04/17 1431 (!) 145/113     Pulse Rate 04/04/17 1431 (!) 108     Resp 04/04/17 1431 16     Temp 04/04/17 1431 98.3 F (36.8 C)     Temp Source 04/04/17 1431 Oral     SpO2 04/04/17 1431 100 %     Weight 04/04/17 1432 175 lb (79.4 kg)     Height 04/04/17 1432 5\' 3"  (1.6 m)     Head Circumference --      Peak Flow --      Pain Score 04/04/17 1432 9     Pain Loc --      Pain Edu? --      Excl. in GC? --     Constitutional: Alert and oriented. Well appearing and in no distress. Eyes: Normal exam ENT   Head: Normocephalic and atraumatic.   Mouth/Throat: Mucous membranes are moist. Cardiovascular: Normal rate, regular rhythm. No murmur Respiratory: Normal respiratory effort without tachypnea nor retractions. Breath sounds are clear  Gastrointestinal: Soft and nontender. No distention.   Musculoskeletal: Nontender with normal range of motion in all extremities.  Neurologic:  Normal speech and language. No gross focal neurologic deficits Skin:  Skin is warm, dry and intact.  Psychiatric: Mood and affect are normal.   ____________________________________________    INITIAL IMPRESSION / ASSESSMENT AND PLAN / ED COURSE  Pertinent labs & imaging results that were available during my care of the patient were reviewed by me and considered in my medical decision making (see chart for details).  The patient presents to the emergency department for elevated blood  sugar, nausea and headache. Overall the patient appears well with a normal physical exam. Nontender abdomen. Patient's labs are largely within normal limits besides hyperglycemia of 315. Normal anion gap. We will treat with IV fluids, Reglan and Toradol. We will reassess CBG after treatment.  Patient's blood sugar has decreased to 37. Patient still awake alert, but states she feels like her blood sugar is low. Patient did not receive insulin in the emergency department. But states she dosed 20 units of regular insulin prior to coming to the emergency department. We will orally hydrate with juice, we'll feed in the emergency department and continue to monitor. We will recheck it in one hour.  Patient blood sugar now 101. Patient states she feels much better. We'll discharge for nausea medication. I discussed with the patient doing a sliding scale insulin regimen, the patient states she has had to do that before. At least until she goes back to her normal diet. Patient agreeable to this plan. We'll follow up with her  doctor.  ____________________________________________   FINAL CLINICAL IMPRESSION(S) / ED DIAGNOSES  Hyperglycemia Headache Nausea    Minna Antis, MD 04/04/17 [phone]

## 2017-04-04 NOTE — ED Notes (Signed)
Pt cbg 65 at this time, pt given one more cup of orange juice, pt eating snack. States she feels better, MD made aware

## 2017-04-04 NOTE — ED Notes (Addendum)
Pt cbg 37, MD made aware, pt given 2 cups of orange juice and sandwich tray. Pt A&OX4, speech clear. Pt states she took 20 U of regular insulin at 1130 today.  Will recheck cbg in 15 min

## 2017-04-04 NOTE — ED Triage Notes (Signed)
Reports hyperglycemia x 2 days.  Today also c/o nausea and headache.

## 2017-04-04 NOTE — Discharge Instructions (Signed)
As we discussed please take your nausea medication as needed, as prescribed. Please follow a sliding scale insulin regimen until your diet has returned to normal. Please follow-up with your doctor in the next 1-2 days for recheck/reevaluation.

## 2017-04-04 NOTE — ED Notes (Signed)
Pt c/o hyperglycemia in 400s today. Pt states n/v xfew days/ pt c/o headache. Pt A&OX4, ambulatory to room

## 2017-04-06 DIAGNOSIS — R1084 Generalized abdominal pain: Secondary | ICD-10-CM | POA: Insufficient documentation

## 2017-05-09 ENCOUNTER — Emergency Department
Admission: EM | Admit: 2017-05-09 | Discharge: 2017-05-09 | Disposition: A | Discharge status: Home / Self Care | Payer: Medicaid Other | Attending: Emergency Medicine | Admitting: Emergency Medicine

## 2017-05-09 ENCOUNTER — Encounter: Payer: Self-pay | Admitting: Emergency Medicine

## 2017-05-09 DIAGNOSIS — I10 Essential (primary) hypertension: Secondary | ICD-10-CM | POA: Insufficient documentation

## 2017-05-09 DIAGNOSIS — E1165 Type 2 diabetes mellitus with hyperglycemia: Secondary | ICD-10-CM | POA: Insufficient documentation

## 2017-05-09 DIAGNOSIS — Z87891 Personal history of nicotine dependence: Secondary | ICD-10-CM | POA: Diagnosis not present

## 2017-05-09 DIAGNOSIS — R109 Unspecified abdominal pain: Secondary | ICD-10-CM | POA: Insufficient documentation

## 2017-05-09 DIAGNOSIS — Z79899 Other long term (current) drug therapy: Secondary | ICD-10-CM | POA: Insufficient documentation

## 2017-05-09 DIAGNOSIS — J069 Acute upper respiratory infection, unspecified: Secondary | ICD-10-CM | POA: Insufficient documentation

## 2017-05-09 DIAGNOSIS — Z794 Long term (current) use of insulin: Secondary | ICD-10-CM | POA: Insufficient documentation

## 2017-05-09 DIAGNOSIS — R531 Weakness: Secondary | ICD-10-CM | POA: Diagnosis present

## 2017-05-09 DIAGNOSIS — R112 Nausea with vomiting, unspecified: Secondary | ICD-10-CM | POA: Insufficient documentation

## 2017-05-09 LAB — URINALYSIS, COMPLETE (UACMP) WITH MICROSCOPIC
Bilirubin Urine: NEGATIVE
Glucose, UA: >=500 mg/dL — AB
Hgb urine dipstick: NEGATIVE
Ketones, ur: 5 mg/dL — AB
Leukocytes, UA: NEGATIVE
Nitrite: NEGATIVE
PROTEIN: NEGATIVE mg/dL
Specific Gravity, Urine: 1.029 (ref 1.005–1.030)
pH: 5 (ref 5.0–8.0)

## 2017-05-09 LAB — COMPREHENSIVE METABOLIC PANEL
ALT: 19 U/L (ref 14–54)
ANION GAP: 11 (ref 5–15)
AST: 39 U/L (ref 15–41)
Albumin: 3.9 g/dL (ref 3.5–5.0)
Alkaline Phosphatase: 68 U/L (ref 38–126)
BUN: 13 mg/dL (ref 6–20)
CHLORIDE: 100 mmol/L — AB (ref 101–111)
CO2: 22 mmol/L (ref 22–32)
Calcium: 9 mg/dL (ref 8.9–10.3)
Creatinine, Ser: 0.93 mg/dL (ref 0.44–1.00)
GFR calc non Af Amer: >60 mL/min (ref >60)
Glucose, Bld: 322 mg/dL — ABNORMAL HIGH (ref 65–99)
POTASSIUM: 3.9 mmol/L (ref 3.5–5.1)
Sodium: 133 mmol/L — ABNORMAL LOW (ref 135–145)
Total Bilirubin: 0.9 mg/dL (ref 0.3–1.2)
Total Protein: 6.8 g/dL (ref 6.5–8.1)

## 2017-05-09 LAB — GLUCOSE, CAPILLARY
GLUCOSE-CAPILLARY: 330 mg/dL — AB (ref 65–99)
GLUCOSE-CAPILLARY: 49 mg/dL — AB (ref 65–99)
GLUCOSE-CAPILLARY: 211 mg/dL — AB (ref 65–99)
GLUCOSE-CAPILLARY: 231 mg/dL — AB (ref 65–99)
Glucose-Capillary: 66 mg/dL (ref 65–99)
Glucose-Capillary: 279 mg/dL — ABNORMAL HIGH (ref 65–99)

## 2017-05-09 LAB — BLOOD GAS, VENOUS
PATIENT TEMPERATURE: 37
PH VEN: 7.39 (ref 7.250–7.430)
pCO2, Ven: 42 mmHg — ABNORMAL LOW (ref 44.0–60.0)
pO2, Ven: 84 mmHg — ABNORMAL HIGH (ref 32.0–45.0)

## 2017-05-09 LAB — CBC
HCT: 38.1 % (ref 35.0–47.0)
HEMOGLOBIN: 12.9 g/dL (ref 12.0–16.0)
MCH: 30 pg (ref 26.0–34.0)
MCHC: 33.9 g/dL (ref 32.0–36.0)
MCV: 88.6 fL (ref 80.0–100.0)
Platelets: 211 10*3/uL (ref 150–440)
RBC: 4.3 MIL/uL (ref 3.80–5.20)
RDW: 14.9 % — ABNORMAL HIGH (ref 11.5–14.5)
WBC: 7.8 10*3/uL (ref 3.6–11.0)

## 2017-05-09 LAB — LIPASE, BLOOD: LIPASE: 23 U/L (ref 11–51)

## 2017-05-09 MED ORDER — DEXTROSE 50 % IV SOLN
1.0000 | Freq: Once | INTRAVENOUS | Status: AC
  Administered 2017-05-09: 50 mL via INTRAVENOUS

## 2017-05-09 MED ORDER — DEXTROSE 50 % IV SOLN
INTRAVENOUS | Status: AC
  Administered 2017-05-09: 50 mL via INTRAVENOUS
  Filled 2017-05-09: qty 100

## 2017-05-09 MED ORDER — SODIUM CHLORIDE 0.9 % IV BOLUS (SEPSIS)
1000.0000 mL | Freq: Once | INTRAVENOUS | Status: AC
  Administered 2017-05-09: 1000 mL via INTRAVENOUS

## 2017-05-09 MED ORDER — ONDANSETRON HCL 4 MG/2ML IJ SOLN
4.0000 mg | Freq: Once | INTRAMUSCULAR | Status: AC
  Administered 2017-05-09: 4 mg via INTRAVENOUS
  Filled 2017-05-09: qty 2

## 2017-05-09 NOTE — ED Notes (Signed)
Patient refused wheel chair out. Patient verbalizes understanding of d/c instructions and follow-up. VS stable and pain controlled per patient.  Patient in NAD at time of d/c and denies further concerns regarding this visit. Patient stable at the time of departure from the unit, departing unit by the safest and most appropriate manner per that patients condition and limitations. Patient advised to return to the ED at any time for emergent concerns, or for new/worsening symptoms.  

## 2017-05-09 NOTE — ED Notes (Addendum)
Pt provided sandwich tray and apple juice.

## 2017-05-09 NOTE — ED Notes (Signed)
Pt ambulatory to toilet without difficulty. 

## 2017-05-09 NOTE — ED Provider Notes (Addendum)
[facility] Emergency Department Provider Note  ____________________________________________   I have reviewed the triage vital signs and the nursing notes.   HISTORY  Chief Complaint Weakness; Nausea; Emesis; Headache; Abdominal Pain; Sore Throat; Nasal Congestion; and Hyperglycemia    HPI Jody Chavez is a 36 y.o. female who presents today complaining of runny nose, vomiting 2 last night, mild sore throat no fever, she states that she was worried when she went to  work and she found her sugars were high so she took 22 units of Humalog 12 units of Lantus which is not her usual dosing at this time and now she feels back to normal. However, she is worried about her sugars. Has a fever or chills and denies abdominal pain, she has no ongoing vomiting or nausea. She did room last night after taking a medication which she thought did not agree with her. Patient essentially has URI symptoms. Patient has done this before prior visits her sugar gets high and then she bottoms out after over taking her insulin however she has no SI or HI.    Past Medical History:  Diagnosis Date  . Anxiety   . Depressed   . Diabetes mellitus without complication (HCC)   . Hypertension   . IBS (irritable bowel syndrome)   . Paroxysmal SVT (supraventricular tachycardia) (HCC) WPW  . Wolff-Parkinson-White (WPW) syndrome     Patient Active Problem List   Diagnosis Date Noted  . DKA (diabetic ketoacidosis) (HCC) 11/03/2016    Past Surgical History:  Procedure Laterality Date  . ABDOMINAL SURGERY    . CARDIAC ELECTROPHYSIOLOGY MAPPING AND ABLATION    . CESAREAN SECTION    . CHOLECYSTECTOMY    . DIAGNOSTIC LAPAROSCOPY WITH REMOVAL OF ECTOPIC PREGNANCY Right 03/04/2016   Procedure: DIAGNOSTIC LAPAROSCOPY WITH right salpingectomy and REMOVAL OF ECTOPIC PREGNANCY, extensive lysis of adhesions;  Surgeon: Suzy Bouchard, MD;  Location: [facility] ORS;  Service: Gynecology;  Laterality:  Right;  . ENDOMETRIAL ABLATION    . HEART ABLATION      Prior to Admission medications   Medication Sig Start Date End Date Taking? Authorizing Provider  amLODipine (NORVASC) 10 MG tablet Take 10 mg by mouth daily.    [provider]  insulin NPH Human (HUMULIN N,NOVOLIN N) 100 UNIT/ML injection Inject 0.13 mLs (13 Units total) into the skin at bedtime. 11/04/16   Houston Siren, MD  insulin regular (HUMULIN R) 250 units/2.58mL (100 units/mL) injection Inject 0.01-0.1 mLs (1-10 Units total) into the skin 3 (three) times daily before meals. 1 units per 10 g of Carb. 11/04/16   Houston Siren, MD  Insulin Syringe-Needle U-100 (INSULIN SYRINGE .3CC/31GX5/16") 31G X 5/16" 0.3 ML MISC 5 times daily as directed. 11/04/16   Houston Siren, MD  lisinopril (PRINIVIL,ZESTRIL) 40 MG tablet Take 40 mg by mouth daily.     [provider]  naproxen (NAPROSYN) 500 MG tablet Take 500 mg by mouth 2 (two) times daily as needed.    [provider]  ondansetron (ZOFRAN ODT) 4 MG disintegrating tablet Take 1 tablet (4 mg total) by mouth every 8 (eight) hours as needed for nausea or vomiting. 04/04/17   Minna Antis, MD  sertraline (ZOLOFT) 50 MG tablet Take 50 mg by mouth at bedtime.     [provider]    Allergies Iodine; Shellfish allergy; and Penicillins  Family History  Problem Relation Age of Onset  . Gallbladder disease Mother     Social History  Social History  Substance Use Topics  . Smoking status: Former Games developermoker  . Smokeless tobacco: Never Used  . Alcohol use Yes     Comment: socially    Review of Systems Constitutional: No fever/chills Eyes: No visual changes. ENT: No sore throat. No stiff neck no neck pain Cardiovascular: Denies chest pain. Respiratory: Denies shortness of breath. Gastrointestinal:   no vomiting.  No diarrhea.  No constipation. Genitourinary: Negative for dysuria. Musculoskeletal: Negative lower extremity swelling Skin: Negative  for rash. Neurological: Negative for severe headaches, focal weakness or numbness.   ____________________________________________   PHYSICAL EXAM:  VITAL SIGNS: ED Triage Vitals  Enc Vitals Group     BP 05/09/17 1428 (!) 157/101     Pulse Rate 05/09/17 1428 96     Resp 05/09/17 1428 20     Temp 05/09/17 1428 98.9 F (37.2 C)     Temp Source 05/09/17 1428 Oral     SpO2 05/09/17 1428 99 %     Weight 05/09/17 1428 172 lb (78 kg)     Height 05/09/17 1428 5\' 3"  (1.6 m)     Head Circumference --      Peak Flow --      Pain Score 05/09/17 1450 7     Pain Loc --      Pain Edu? --      Excl. in GC? --     Constitutional: Alert and oriented. Well appearing and in no acute distress. Eyes: Conjunctivae are normal Head: Atraumatic HEENT: Mild clear congestion/rhinnorhea. Mucous membranes are moist.  Oropharynx non-erythematous Neck:   Nontender with no meningismus, no masses, no stridor Cardiovascular: Normal rate, regular rhythm. Grossly normal heart sounds.  Good peripheral circulation. Respiratory: Normal respiratory effort.  No retractions. Lungs CTAB. Abdominal: Soft and nontender. No distention. No guarding no rebound Back:  There is no focal tenderness or step off.  there is no midline tenderness there are no lesions noted. there is no CVA tenderness Musculoskeletal: No lower extremity tenderness, no upper extremity tenderness. No joint effusions, no DVT signs strong distal pulses no edema Neurologic:  Normal speech and language. No gross focal neurologic deficits are appreciated.  Skin:  Skin is warm, dry and intact. No rash noted. Psychiatric: Mood and affect are normal. Speech and behavior are normal.  ____________________________________________   LABS (all labs ordered are listed, but only abnormal results are displayed)  Labs Reviewed  GLUCOSE, CAPILLARY - Abnormal; Notable for the following:       Result Value   Glucose-Capillary 330 (*)    All other components  within normal limits  COMPREHENSIVE METABOLIC PANEL - Abnormal; Notable for the following:    Sodium 133 (*)    Chloride 100 (*)    Glucose, Bld 322 (*)    All other components within normal limits  CBC - Abnormal; Notable for the following:    RDW 14.9 (*)    All other components within normal limits  URINALYSIS, COMPLETE (UACMP) WITH MICROSCOPIC - Abnormal; Notable for the following:    Color, Urine STRAW (*)    APPearance CLEAR (*)    Glucose, UA >=500 (*)    Ketones, ur 5 (*)    Bacteria, UA RARE (*)    Squamous Epithelial / LPF 0-5 (*)    All other components within normal limits  BLOOD GAS, VENOUS - Abnormal; Notable for the following:    pCO2, Ven 42 (*)    pO2, Ven 84.0 (*)    All other  components within normal limits  GLUCOSE, CAPILLARY - Abnormal; Notable for the following:    Glucose-Capillary 49 (*)    All other components within normal limits  LIPASE, BLOOD  GLUCOSE, CAPILLARY  POC URINE PREG, ED  CBG MONITORING, ED   ____________________________________________  EKG  I personally interpreted any EKGs ordered by me or triage Normal sinus rhythm at 80 beats. No history of is no acute ST depression normal axis unremarkable EKG ____________________________________________  RADIOLOGY  I reviewed any imaging ordered by me or triage that were performed during my shift and, if possible, patient and/or family made aware of any abnormal findings. ____________________________________________   PROCEDURES  Procedure(s) performed: None  Procedures  Critical Care performed: None  ____________________________________________   INITIAL IMPRESSION / ASSESSMENT AND PLAN / ED COURSE  Pertinent labs & imaging results that were available during my care of the patient were reviewed by me and considered in my medical decision making (see chart for details).  Patient in no acute distress is mild URI symptoms however I'm more concerned about the fact that she gave  herself a large bolus of insulin prior to coming in in response to an elevated sugar and and I was concerned her sugar would drop to watch it carefully and of course it did. He went down to 62. We have given her food it is still we are giving her D50. She has no SI or HI however there is no evidence of DKA workup is otherwise reassuring. We will continue to feed her much her sugar remains low we will admit her.    ----------------------------------------- 8:04 PM on 05/09/2017 -----------------------------------------  There is a stabilized and 200s which is normal for the patient she will keep checking her sugar home she is eager to be discharged, she has no ongoing symptoms except for mild runny nose, nothing to suggest pneumonia, and a cranial infection, strep throat, peritonsillar abscess, retropharyngeal abscess, any abdominal pathology, or any other acute pathology aside from URI symptoms and hypoglycemia from excess response with insulin. I have advised her very careful about taking insulin and she will return to the emergency room for new or worrisome symptoms, return precautions given and understood follow-up also.   ____________________________________________   FINAL CLINICAL IMPRESSION(S) / ED DIAGNOSES  Final diagnoses:  None      This chart was dictated using voice recognition software.  Despite best efforts to proofread,  errors can occur which can change meaning.      Jeanmarie Plant, MD 05/09/17 1708    Jeanmarie Plant, MD 05/09/17 2005

## 2017-05-09 NOTE — ED Triage Notes (Signed)
Has had sore throat and congestion for 3 days.  Took dayquil and it upset h er stomach.  Says she feels weak and nausea vomiting since last night.  Sugar was ok this am and went to work.  Got weak and checked it was 536.  Took 22units humalog and 12 units lantis at that time (about 1 hr pta)

## 2017-05-09 NOTE — Discharge Instructions (Signed)
Continue taking your insulin as usual, if you do find that her sugars are elevated, Be very careful taking extra insulin as a last 2 times you have done that that you have bottomed yourself out. Return to the emergency room for any new or worrisome symptoms.

## 2017-07-21 ENCOUNTER — Encounter: Payer: Self-pay | Admitting: Emergency Medicine

## 2017-07-21 ENCOUNTER — Emergency Department
Admission: EM | Admit: 2017-07-21 | Discharge: 2017-07-21 | Disposition: A | Discharge status: Home / Self Care | Payer: Managed Care, Other (non HMO) | Attending: Emergency Medicine | Admitting: Emergency Medicine

## 2017-07-21 DIAGNOSIS — Z794 Long term (current) use of insulin: Secondary | ICD-10-CM | POA: Insufficient documentation

## 2017-07-21 DIAGNOSIS — E119 Type 2 diabetes mellitus without complications: Secondary | ICD-10-CM | POA: Insufficient documentation

## 2017-07-21 DIAGNOSIS — I10 Essential (primary) hypertension: Secondary | ICD-10-CM | POA: Diagnosis not present

## 2017-07-21 DIAGNOSIS — R05 Cough: Secondary | ICD-10-CM | POA: Diagnosis not present

## 2017-07-21 DIAGNOSIS — J029 Acute pharyngitis, unspecified: Secondary | ICD-10-CM | POA: Insufficient documentation

## 2017-07-21 DIAGNOSIS — R42 Dizziness and giddiness: Secondary | ICD-10-CM | POA: Insufficient documentation

## 2017-07-21 DIAGNOSIS — Z87891 Personal history of nicotine dependence: Secondary | ICD-10-CM | POA: Insufficient documentation

## 2017-07-21 DIAGNOSIS — B349 Viral infection, unspecified: Secondary | ICD-10-CM | POA: Diagnosis not present

## 2017-07-21 DIAGNOSIS — Z79899 Other long term (current) drug therapy: Secondary | ICD-10-CM | POA: Diagnosis not present

## 2017-07-21 DIAGNOSIS — R509 Fever, unspecified: Secondary | ICD-10-CM | POA: Diagnosis present

## 2017-07-21 DIAGNOSIS — R112 Nausea with vomiting, unspecified: Secondary | ICD-10-CM | POA: Insufficient documentation

## 2017-07-21 LAB — URINALYSIS, COMPLETE (UACMP) WITH MICROSCOPIC
BACTERIA UA: NONE SEEN
BILIRUBIN URINE: NEGATIVE
Glucose, UA: >=500 mg/dL — AB
Hgb urine dipstick: NEGATIVE
KETONES UR: 20 mg/dL — AB
LEUKOCYTES UA: NEGATIVE
NITRITE: NEGATIVE
Protein, ur: NEGATIVE mg/dL
RBC / HPF: NONE SEEN RBC/hpf (ref 0–5)
Specific Gravity, Urine: 1.029 (ref 1.005–1.030)
pH: 5 (ref 5.0–8.0)

## 2017-07-21 LAB — COMPREHENSIVE METABOLIC PANEL
ALBUMIN: 3.9 g/dL (ref 3.5–5.0)
ALT: 18 U/L (ref 14–54)
ANION GAP: 12 (ref 5–15)
AST: 26 U/L (ref 15–41)
Alkaline Phosphatase: 64 U/L (ref 38–126)
BILIRUBIN TOTAL: 1.6 mg/dL — AB (ref 0.3–1.2)
BUN: 14 mg/dL (ref 6–20)
CHLORIDE: 102 mmol/L (ref 101–111)
CO2: 20 mmol/L — ABNORMAL LOW (ref 22–32)
Calcium: 9 mg/dL (ref 8.9–10.3)
Creatinine, Ser: 0.91 mg/dL (ref 0.44–1.00)
GFR calc Af Amer: >60 mL/min (ref >60)
GFR calc non Af Amer: >60 mL/min (ref >60)
GLUCOSE: 376 mg/dL — AB (ref 65–99)
POTASSIUM: 4 mmol/L (ref 3.5–5.1)
SODIUM: 134 mmol/L — AB (ref 135–145)
TOTAL PROTEIN: 7.1 g/dL (ref 6.5–8.1)

## 2017-07-21 LAB — CBC
HEMATOCRIT: 41.6 % (ref 35.0–47.0)
HEMOGLOBIN: 14.1 g/dL (ref 12.0–16.0)
MCH: 30.7 pg (ref 26.0–34.0)
MCHC: 33.8 g/dL (ref 32.0–36.0)
MCV: 90.9 fL (ref 80.0–100.0)
Platelets: 211 10*3/uL (ref 150–440)
RBC: 4.58 MIL/uL (ref 3.80–5.20)
RDW: 14.4 % (ref 11.5–14.5)
WBC: 11.2 10*3/uL — ABNORMAL HIGH (ref 3.6–11.0)

## 2017-07-21 LAB — LIPASE, BLOOD: LIPASE: 17 U/L (ref 11–51)

## 2017-07-21 LAB — POCT RAPID STREP A: STREPTOCOCCUS, GROUP A SCREEN (DIRECT): NEGATIVE

## 2017-07-21 LAB — POCT PREGNANCY, URINE: PREG TEST UR: NEGATIVE

## 2017-07-21 LAB — GLUCOSE, CAPILLARY: GLUCOSE-CAPILLARY: 302 mg/dL — AB (ref 65–99)

## 2017-07-21 MED ORDER — ONDANSETRON 4 MG PO TBDP
4.0000 mg | ORAL_TABLET | Freq: Once | ORAL | Status: AC | PRN
  Administered 2017-07-21: 4 mg via ORAL
  Filled 2017-07-21: qty 1

## 2017-07-21 MED ORDER — IBUPROFEN 600 MG PO TABS
ORAL_TABLET | ORAL | Status: DC
Start: 2017-07-21 — End: 2017-07-21
  Filled 2017-07-21: qty 1

## 2017-07-21 MED ORDER — ACETAMINOPHEN 500 MG PO TABS
1000.0000 mg | ORAL_TABLET | Freq: Once | ORAL | Status: AC
  Administered 2017-07-21: 1000 mg via ORAL

## 2017-07-21 MED ORDER — IBUPROFEN 600 MG PO TABS
600.0000 mg | ORAL_TABLET | Freq: Once | ORAL | Status: AC
  Administered 2017-07-21: 600 mg via ORAL

## 2017-07-21 MED ORDER — ACETAMINOPHEN 500 MG PO TABS
ORAL_TABLET | ORAL | Status: AC
  Filled 2017-07-21: qty 2

## 2017-07-21 NOTE — ED Notes (Signed)
ED Provider at bedside. 

## 2017-07-21 NOTE — ED Triage Notes (Addendum)
Pt c/o vomiting and diarrhea, sore throat and dizzy spells x 4 days.  Vomited x 3 in past 24 hours, diarrhea x 4 in past 24 hours.  Pt states stools are liquid and soft.  Pt c/o right side sore throat with pain when swallowing.  Pt states pain is worse when swallowing.  Pt states she is a Type 1 diabetic and states her blood sugars have been running high and had a low BS reading yesterday of 56.  Pt does not have an insulin pump. Pt had 12 units of insulin today of Humalog.

## 2017-07-21 NOTE — Discharge Instructions (Signed)
Please take 600 mg of ibuprofen as well as 1000 mg of Tylenol 3 times a day as needed for pain and muscle aches and follow-up with your primary care physician this coming Monday for reevaluation as needed. Return to the emergency department sooner for any new or worsening symptoms such as if he cannot eat or drink, if his fever does not break, or for any other concerns whatsoever.  It was a pleasure to take care of you today, and thank you for coming to our emergency department.  If you have any questions or concerns before leaving please ask the nurse to grab me and I'm more than happy to go through your aftercare instructions again.  If you were prescribed any opioid pain medication today such as Norco, Vicodin, Percocet, morphine, hydrocodone, or oxycodone please make sure you do not drive when you are taking this medication as it can alter your ability to drive safely.  If you have any concerns once you are home that you are not improving or are in fact getting worse before you can make it to your follow-up appointment, please do not hesitate to call 911 and come back for further evaluation.  Merrily Brittle, MD  Results for orders placed or performed during the hospital encounter of 07/21/17  Lipase, blood  Result Value Ref Range   Lipase 17 11 - 51 U/L  Comprehensive metabolic panel  Result Value Ref Range   Sodium 134 (L) 135 - 145 mmol/L   Potassium 4.0 3.5 - 5.1 mmol/L   Chloride 102 101 - 111 mmol/L   CO2 20 (L) 22 - 32 mmol/L   Glucose, Bld 376 (H) 65 - 99 mg/dL   BUN 14 6 - 20 mg/dL   Creatinine, Ser 1.61 0.44 - 1.00 mg/dL   Calcium 9.0 8.9 - 09.6 mg/dL   Total Protein 7.1 6.5 - 8.1 g/dL   Albumin 3.9 3.5 - 5.0 g/dL   AST 26 15 - 41 U/L   ALT 18 14 - 54 U/L   Alkaline Phosphatase 64 38 - 126 U/L   Total Bilirubin 1.6 (H) 0.3 - 1.2 mg/dL   GFR calc non Af Amer >60 >60 mL/min   GFR calc Af Amer >60 >60 mL/min   Anion gap 12 5 - 15  CBC  Result Value Ref Range   WBC 11.2  (H) 3.6 - 11.0 K/uL   RBC 4.58 3.80 - 5.20 MIL/uL   Hemoglobin 14.1 12.0 - 16.0 g/dL   HCT 04.5 40.9 - 81.1 %   MCV 90.9 80.0 - 100.0 fL   MCH 30.7 26.0 - 34.0 pg   MCHC 33.8 32.0 - 36.0 g/dL   RDW 91.4 78.2 - 95.6 %   Platelets 211 150 - 440 K/uL  Urinalysis, Complete w Microscopic  Result Value Ref Range   Color, Urine STRAW (A) YELLOW   APPearance CLEAR (A) CLEAR   Specific Gravity, Urine 1.029 1.005 - 1.030   pH 5.0 5.0 - 8.0   Glucose, UA >=500 (A) NEGATIVE mg/dL   Hgb urine dipstick NEGATIVE NEGATIVE   Bilirubin Urine NEGATIVE NEGATIVE   Ketones, ur 20 (A) NEGATIVE mg/dL   Protein, ur NEGATIVE NEGATIVE mg/dL   Nitrite NEGATIVE NEGATIVE   Leukocytes, UA NEGATIVE NEGATIVE   RBC / HPF NONE SEEN 0 - 5 RBC/hpf   WBC, UA 0-5 0 - 5 WBC/hpf   Bacteria, UA NONE SEEN NONE SEEN   Squamous Epithelial / LPF 0-5 (A) NONE SEEN  Glucose,  capillary  Result Value Ref Range   Glucose-Capillary 302 (H) 65 - 99 mg/dL  Pregnancy, urine POC  Result Value Ref Range   Preg Test, Ur NEGATIVE NEGATIVE  POCT rapid strep A [facility])  Result Value Ref Range   Streptococcus, Group A Screen (Direct) NEGATIVE NEGATIVE

## 2017-07-21 NOTE — ED Provider Notes (Signed)
[facility] Emergency Department Provider Note  ____________________________________________   First MD Initiated Contact with Patient 07/21/17 1539     (approximate)  I have reviewed the triage vital signs and the nursing notes.   HISTORY  Chief Complaint Emesis; Diarrhea; and Sore Throat    HPI Jody Chavez is a 36 y.o. female who self presents to the emergency department with multiple complaints. She reports feeling febrile, lightheaded, and nauseated for the past 3-4 days. She's had 3 loose watery stools and is vomited 3 times in the past 24 hours. She is a type I diabetic and she says her blood sugars have been running high. She's had some dry cough. Some mild sore throat. The sore throat is worse with swallowing and improved with not swallowing. She has not gotten her flu shot this year. She has no leg swelling. She is able to eat solid food.   Past Medical History:  Diagnosis Date  . Anxiety   . Depressed   . Diabetes mellitus without complication (HCC)   . Hypertension   . IBS (irritable bowel syndrome)   . Paroxysmal SVT (supraventricular tachycardia) (HCC) WPW  . Wolff-Parkinson-White (WPW) syndrome     Patient Active Problem List   Diagnosis Date Noted  . DKA (diabetic ketoacidosis) (HCC) 11/03/2016    Past Surgical History:  Procedure Laterality Date  . ABDOMINAL SURGERY    . CARDIAC ELECTROPHYSIOLOGY MAPPING AND ABLATION    . CESAREAN SECTION    . CHOLECYSTECTOMY    . DIAGNOSTIC LAPAROSCOPY WITH REMOVAL OF ECTOPIC PREGNANCY Right 03/04/2016   Procedure: DIAGNOSTIC LAPAROSCOPY WITH right salpingectomy and REMOVAL OF ECTOPIC PREGNANCY, extensive lysis of adhesions;  Surgeon: Suzy Bouchard, MD;  Location: [facility] ORS;  Service: Gynecology;  Laterality: Right;  . ENDOMETRIAL ABLATION    . HEART ABLATION      Prior to Admission medications   Medication Sig Start Date End Date Taking? Authorizing Provider  amLODipine (NORVASC)  10 MG tablet Take 10 mg by mouth daily.    [provider]  insulin glargine (LANTUS) 100 UNIT/ML injection Inject 13 Units into the skin 2 (two) times daily.    [provider]  insulin lispro (HUMALOG) 100 UNIT/ML injection Inject into the skin 3 (three) times daily before meals. Does 1 units per 10 carbs    [provider]  insulin NPH Human (HUMULIN N,NOVOLIN N) 100 UNIT/ML injection Inject 0.13 mLs (13 Units total) into the skin at bedtime. Patient not taking: Reported on 05/09/2017 11/04/16   Houston Siren, MD  insulin regular (HUMULIN R) 250 units/2.71mL (100 units/mL) injection Inject 0.01-0.1 mLs (1-10 Units total) into the skin 3 (three) times daily before meals. 1 units per 10 g of Carb. Patient not taking: Reported on 05/09/2017 11/04/16   Houston Siren, MD  Insulin Syringe-Needle U-100 (INSULIN SYRINGE .3CC/31GX5/16") 31G X 5/16" 0.3 ML MISC 5 times daily as directed. 11/04/16   Houston Siren, MD  lisinopril (PRINIVIL,ZESTRIL) 40 MG tablet Take 40 mg by mouth daily.     [provider]  naproxen (NAPROSYN) 500 MG tablet Take 500 mg by mouth 2 (two) times daily as needed.    [provider]  ondansetron (ZOFRAN ODT) 4 MG disintegrating tablet Take 1 tablet (4 mg total) by mouth every 8 (eight) hours as needed for nausea or vomiting. 04/04/17   Minna Antis, MD  sertraline (ZOLOFT) 50 MG tablet Take 50 mg by mouth at bedtime.  [provider]    Allergies Iodine; Shellfish allergy; and Penicillins  Family History  Problem Relation Age of Onset  . Gallbladder disease Mother     Social History Social History  Substance Use Topics  . Smoking status: Former Games developer  . Smokeless tobacco: Never Used  . Alcohol use Yes     Comment: socially    Review of Systems Constitutional: Positive fever Eyes: No visual changes. ENT: Positive sore throat. Cardiovascular: Denies chest pain. Respiratory: Denies shortness of  breath. Gastrointestinal: No abdominal pain.  Positive nausea, positive vomiting.  Positive diarrhea.  No constipation. Genitourinary: Negative for dysuria. Musculoskeletal: Negative for back pain. Skin: Negative for rash. Neurological: Negative for headaches, focal weakness or numbness.   ____________________________________________   PHYSICAL EXAM:  VITAL SIGNS: ED Triage Vitals  Enc Vitals Group     BP 07/21/17 1221 (!) 147/93     Pulse Rate 07/21/17 1221 (!) 114     Resp 07/21/17 1221 20     Temp 07/21/17 1221 99.2 F (37.3 C)     Temp Source 07/21/17 1221 Oral     SpO2 07/21/17 1221 98 %     Weight 07/21/17 1223 175 lb (79.4 kg)     Height 07/21/17 1223  (1.6 m)     Head Circumference --      Peak Flow --      Pain Score 07/21/17 1236 7     Pain Loc --      Pain Edu? --      Excl. in GC? --     Constitutional: Alert and oriented 4 appears somewhat uncomfortable but no diaphoresis and no respiratory distress Eyes: PERRL EOMI. Head: Atraumatic. Nose: No congestion/rhinnorhea. Mouth/Throat: No trismus uvula midline no pharyngeal erythema or exudate no lymphadenopathy appreciated Neck: No stridor.   Cardiovascular: Tachycardic rate, regular rhythm. Grossly normal heart sounds.  Good peripheral circulation. Respiratory: Some slightly increased respiratory effort.  No retractions. Lungs CTAB and moving good air Gastrointestinal: Soft nontender Musculoskeletal: No lower extremity edema   Neurologic:  Normal speech and language. No gross focal neurologic deficits are appreciated. Skin:  Skin is warm, dry and intact. No rash noted. Psychiatric: Mood and affect are normal. Speech and behavior are normal.    ____________________________________________   DIFFERENTIAL includes but not limited to  Influenza, viral syndrome, pneumonia, strep pharyngitis, viral pharyngitis, diabetic ketoacidosis ____________________________________________   LABS (all labs ordered  are listed, but only abnormal results are displayed)  Labs Reviewed  COMPREHENSIVE METABOLIC PANEL - Abnormal; Notable for the following:       Result Value   Sodium 134 (*)    CO2 20 (*)    Glucose, Bld 376 (*)    Total Bilirubin 1.6 (*)    All other components within normal limits  CBC - Abnormal; Notable for the following:    WBC 11.2 (*)    All other components within normal limits  URINALYSIS, COMPLETE (UACMP) WITH MICROSCOPIC - Abnormal; Notable for the following:    Color, Urine STRAW (*)    APPearance CLEAR (*)    Glucose, UA >=500 (*)    Ketones, ur 20 (*)    Squamous Epithelial / LPF 0-5 (*)    All other components within normal limits  GLUCOSE, CAPILLARY - Abnormal; Notable for the following:    Glucose-Capillary 302 (*)    All other components within normal limits  CULTURE, GROUP A STREP (THRC)  LIPASE, BLOOD  POCT PREGNANCY, URINE  POC URINE  PREG, ED  POCT RAPID STREP A  CBG MONITORING, ED    Blood work reviewed and interpreted by me: Elevated glucose although no signs of diabetic ketoacidosis no evidence of streptococcal pharyngitis __________________________________________  EKG  ED ECG REPORT I, Merrily Brittle, the attending physician, personally viewed and interpreted this ECG.  Date: 07/21/2017 EKG Time:  Rate: 90 Rhythm: normal sinus rhythm QRS Axis: normal Intervals: normal ST/T Wave abnormalities: normal Narrative Interpretation: no evidence of acute ischemia  ____________________________________________  RADIOLOGY   ____________________________________________   PROCEDURES  Procedure(s) performed: no  Procedures  Critical Care performed: no  Observation: no ____________________________________________   INITIAL IMPRESSION / ASSESSMENT AND PLAN / ED COURSE  Pertinent labs & imaging results that were available during my care of the patient were reviewed by me and considered in my medical decision making (see chart for  details).  The patient arrives tachycardic and somewhat uncomfortable appearing. Her symptoms are nonspecific and seemed to be most consistent with viral syndrome. She is outside of the window for treatment with Tamiflu so I will defer testing. After symptomatic treatment the patient feels markedly improved. She does have elevated glucose which is likely secondary to illness and stress that she has no evidence of diabetic ketoacidosis. I will refer her back to her primary care physician with symptomatic treatment. The patient verbalizes understanding and agreement with the plan.      ____________________________________________   FINAL CLINICAL IMPRESSION(S) / ED DIAGNOSES  Final diagnoses:  Viral syndrome      NEW MEDICATIONS STARTED DURING THIS VISIT:  Discharge Medication List as of 07/21/2017  5:22 PM       Note:  This document was prepared using Dragon voice recognition software and may include unintentional dictation errors.     Merrily Brittle, MD 07/22/17 1357

## 2017-07-23 LAB — CULTURE, GROUP A STREP (THRC)

## 2017-11-21 ENCOUNTER — Emergency Department
Admission: EM | Admit: 2017-11-21 | Discharge: 2017-11-21 | Disposition: A | Discharge status: Home / Self Care | Payer: Managed Care, Other (non HMO) | Attending: Emergency Medicine | Admitting: Emergency Medicine

## 2017-11-21 ENCOUNTER — Encounter: Payer: Self-pay | Admitting: Emergency Medicine

## 2017-11-21 ENCOUNTER — Other Ambulatory Visit: Payer: Self-pay

## 2017-11-21 ENCOUNTER — Emergency Department: Payer: Managed Care, Other (non HMO)

## 2017-11-21 DIAGNOSIS — E1110 Type 2 diabetes mellitus with ketoacidosis without coma: Secondary | ICD-10-CM | POA: Insufficient documentation

## 2017-11-21 DIAGNOSIS — R51 Headache: Secondary | ICD-10-CM

## 2017-11-21 DIAGNOSIS — Z794 Long term (current) use of insulin: Secondary | ICD-10-CM | POA: Diagnosis not present

## 2017-11-21 DIAGNOSIS — I10 Essential (primary) hypertension: Secondary | ICD-10-CM

## 2017-11-21 DIAGNOSIS — Z87891 Personal history of nicotine dependence: Secondary | ICD-10-CM | POA: Diagnosis not present

## 2017-11-21 DIAGNOSIS — R519 Headache, unspecified: Secondary | ICD-10-CM

## 2017-11-21 DIAGNOSIS — R079 Chest pain, unspecified: Secondary | ICD-10-CM

## 2017-11-21 DIAGNOSIS — Z79899 Other long term (current) drug therapy: Secondary | ICD-10-CM | POA: Diagnosis not present

## 2017-11-21 LAB — CBC
HEMATOCRIT: 43.3 % (ref 35.0–47.0)
HEMOGLOBIN: 14.6 g/dL (ref 12.0–16.0)
MCH: 30.6 pg (ref 26.0–34.0)
MCHC: 33.7 g/dL (ref 32.0–36.0)
MCV: 90.8 fL (ref 80.0–100.0)
Platelets: 242 10*3/uL (ref 150–440)
RBC: 4.77 MIL/uL (ref 3.80–5.20)
RDW: 14.3 % (ref 11.5–14.5)
WBC: 10.3 10*3/uL (ref 3.6–11.0)

## 2017-11-21 LAB — BASIC METABOLIC PANEL
ANION GAP: 12 (ref 5–15)
BUN: 19 mg/dL (ref 6–20)
CO2: 23 mmol/L (ref 22–32)
Calcium: 9.4 mg/dL (ref 8.9–10.3)
Chloride: 101 mmol/L (ref 101–111)
Creatinine, Ser: 0.89 mg/dL (ref 0.44–1.00)
GFR calc Af Amer: >60 mL/min (ref >60)
GLUCOSE: 118 mg/dL — AB (ref 65–99)
POTASSIUM: 3.6 mmol/L (ref 3.5–5.1)
Sodium: 136 mmol/L (ref 135–145)

## 2017-11-21 LAB — TROPONIN I: Troponin I: <0.03 ng/mL (ref <0.03)

## 2017-11-21 MED ORDER — NITROGLYCERIN 0.4 MG SL SUBL
0.4000 mg | SUBLINGUAL_TABLET | SUBLINGUAL | Status: DC | PRN
  Administered 2017-11-21: 0.4 mg via SUBLINGUAL
  Filled 2017-11-21: qty 1

## 2017-11-21 MED ORDER — KETOROLAC TROMETHAMINE 30 MG/ML IJ SOLN
30.0000 mg | Freq: Once | INTRAMUSCULAR | Status: AC
  Administered 2017-11-21: 30 mg via INTRAVENOUS
  Filled 2017-11-21: qty 1

## 2017-11-21 MED ORDER — BUTALBITAL-APAP-CAFFEINE 50-325-40 MG PO TABS: 1.0000 | ORAL_TABLET | Freq: Four times a day (QID) | ORAL | 0 refills | Status: DC | PRN

## 2017-11-21 MED ORDER — METOCLOPRAMIDE HCL 5 MG/ML IJ SOLN
10.0000 mg | Freq: Once | INTRAMUSCULAR | Status: AC
  Administered 2017-11-21: 10 mg via INTRAVENOUS
  Filled 2017-11-21: qty 2

## 2017-11-21 MED ORDER — SODIUM CHLORIDE 0.9 % IV BOLUS (SEPSIS)
1000.0000 mL | Freq: Once | INTRAVENOUS | Status: AC
  Administered 2017-11-21: 1000 mL via INTRAVENOUS

## 2017-11-21 MED ORDER — DIPHENHYDRAMINE HCL 50 MG/ML IJ SOLN
25.0000 mg | Freq: Once | INTRAMUSCULAR | Status: AC
  Administered 2017-11-21: 25 mg via INTRAVENOUS
  Filled 2017-11-21: qty 1

## 2017-11-21 NOTE — ED Notes (Signed)
Topez not working 

## 2017-11-21 NOTE — ED Triage Notes (Signed)
Patient ambulatory to triage with steady gait, without difficulty or distress noted; pt c/o right sided CP radiating into back accomp by HA x 3-4 days; st hx Wolffe-parkinson white syndrome with ablation 03/2011

## 2017-11-21 NOTE — Discharge Instructions (Signed)
Please follow up with your primary care physician.

## 2017-11-21 NOTE — ED Notes (Signed)
Pt sitting in lobby with no distress noted; vs retaken; pt updated on wait time

## 2017-11-21 NOTE — ED Provider Notes (Signed)
[facility] Emergency Department Provider Note   ____________________________________________   First MD Initiated Contact with Patient 11/21/17 0319     (approximate)  I have reviewed the triage vital signs and the nursing notes.   HISTORY  Chief Complaint Chest Pain    HPI Jody Chavez is a 37 y.o. female who comes into the hospital today with some right-sided chest pain for the past 4 days and a migraine headache.  The patient states that she has had migraines in the past.  She takes this is all due to her blood pressure.  The patient ran out of her blood pressure medicines a week ago.  She states that her primary care physician's office is booked until February but she made an appointment with another provider for Thursday.  The patient states that she has not been feeling well.  Her blood sugars have been up in the 500s.  She has been taking Norvasc and lisinopril in the past.  She also woke up with a knot on her forehead and this pressure in her chest.  The patient denies any shortness of breath or cough but she has had some dizziness and nausea.  Patient denies any vomiting.  She has been taking her diabetes medications at home.  She reports that her blood pressure seems to go up more when she stands.  The patient decided to come into the hospital today for further evaluation of the symptoms.  The patient rates her pain an 8 out of 10 in intensity.   Past Medical History:  Diagnosis Date  . Anxiety   . Depressed   . Diabetes mellitus without complication (HCC)   . Hypertension   . IBS (irritable bowel syndrome)   . Paroxysmal SVT (supraventricular tachycardia) (HCC) WPW  . Wolff-Parkinson-White (WPW) syndrome     Patient Active Problem List   Diagnosis Date Noted  . DKA (diabetic ketoacidosis) (HCC) 11/03/2016    Past Surgical History:  Procedure Laterality Date  . ABDOMINAL SURGERY    . CARDIAC ELECTROPHYSIOLOGY MAPPING AND ABLATION    .  CESAREAN SECTION    . CHOLECYSTECTOMY    . DIAGNOSTIC LAPAROSCOPY WITH REMOVAL OF ECTOPIC PREGNANCY Right 03/04/2016   Procedure: DIAGNOSTIC LAPAROSCOPY WITH right salpingectomy and REMOVAL OF ECTOPIC PREGNANCY, extensive lysis of adhesions;  Surgeon: Suzy Bouchardhomas J Schermerhorn, MD;  Location: [facility] ORS;  Service: Gynecology;  Laterality: Right;  . ENDOMETRIAL ABLATION    . HEART ABLATION      Prior to Admission medications   Medication Sig Start Date End Date Taking? Authorizing Provider  amLODipine (NORVASC) 10 MG tablet Take 10 mg by mouth daily.    [provider]  insulin glargine (LANTUS) 100 UNIT/ML injection Inject 13 Units into the skin 2 (two) times daily.    [provider]  insulin lispro (HUMALOG) 100 UNIT/ML injection Inject into the skin 3 (three) times daily before meals. Does 1 units per 10 carbs    [provider]  insulin NPH Human (HUMULIN N,NOVOLIN N) 100 UNIT/ML injection Inject 0.13 mLs (13 Units total) into the skin at bedtime. Patient not taking: Reported on 05/09/2017 11/04/16   Houston SirenSainani, Vivek J, MD  insulin regular (HUMULIN R) 250 units/2.15mL (100 units/mL) injection Inject 0.01-0.1 mLs (1-10 Units total) into the skin 3 (three) times daily before meals. 1 units per 10 g of Carb. Patient not taking: Reported on 05/09/2017 11/04/16   Houston SirenSainani, Vivek J, MD  Insulin Syringe-Needle U-100 (INSULIN SYRINGE .3CC/31GX5/16") 31G X  5/16" 0.3 ML MISC 5 times daily as directed. 11/04/16   Houston Siren, MD  lisinopril (PRINIVIL,ZESTRIL) 40 MG tablet Take 40 mg by mouth daily.     [provider]  naproxen (NAPROSYN) 500 MG tablet Take 500 mg by mouth 2 (two) times daily as needed.    [provider]  ondansetron (ZOFRAN ODT) 4 MG disintegrating tablet Take 1 tablet (4 mg total) by mouth every 8 (eight) hours as needed for nausea or vomiting. 04/04/17   Minna Antis, MD  sertraline (ZOLOFT) 50 MG tablet Take 50 mg by mouth at bedtime.      [provider]    Allergies Iodine; Shellfish allergy; and Penicillins  Family History  Problem Relation Age of Onset  . Gallbladder disease Mother     Social History Social History   Tobacco Use  . Smoking status: Former Games developer  . Smokeless tobacco: Never Used  Substance Use Topics  . Alcohol use: Yes    Comment: socially  . Drug use: No    Review of Systems  Constitutional: No fever/chills Eyes: No visual changes. ENT: No sore throat. Cardiovascular:  chest pain. Respiratory: Denies shortness of breath. Gastrointestinal: Nausea, No abdominal pain. no vomiting.  No diarrhea.  No constipation. Genitourinary: Negative for dysuria. Musculoskeletal: Negative for back pain. Skin: Negative for rash. Neurological: Headache and lightheadedness   ____________________________________________   PHYSICAL EXAM:  VITAL SIGNS: ED Triage Vitals  Enc Vitals Group     BP 11/21/17 0028 (!) 176/123     Pulse Rate 11/21/17 0028 (!) 107     Resp 11/21/17 0028 17     Temp 11/21/17 0028 98.7 F (37.1 C)     Temp Source 11/21/17 0028 Oral     SpO2 11/21/17 0028 100 %     Weight 11/21/17 0029 175 lb (79.4 kg)     Height 11/21/17 0029 5\' 3"  (1.6 m)     Head Circumference --      Peak Flow --      Pain Score 11/21/17 0032 8     Pain Loc --      Pain Edu? --      Excl. in GC? --     Constitutional: Alert and oriented. Well appearing and in moderate distress. Eyes: Conjunctivae are normal. PERRL. EOMI. Head: Atraumatic. Nose: No congestion/rhinnorhea. Mouth/Throat: Mucous membranes are moist.  Oropharynx non-erythematous. Cardiovascular: Normal rate, regular rhythm. Grossly normal heart sounds.  Good peripheral circulation. Respiratory: Normal respiratory effort.  No retractions. Lungs CTAB. Gastrointestinal: Soft and nontender. No distention.  Positive bowel sounds Musculoskeletal: No lower extremity tenderness nor edema.   Neurologic:  Normal speech and language.   Cranial nerves II through XII are grossly intact with no focal motor neuro deficit Skin:  Skin is warm, dry and intact.  Psychiatric: Mood and affect are normal.   ____________________________________________   LABS (all labs ordered are listed, but only abnormal results are displayed)  Labs Reviewed  BASIC METABOLIC PANEL - Abnormal; Notable for the following components:      Result Value   Glucose, Bld 118 (*)    All other components within normal limits  CBC  TROPONIN I  TROPONIN I   ____________________________________________  EKG  ED ECG REPORT I, Rebecka Apley, the attending physician, personally viewed and interpreted this ECG.   Date: 11/21/2017  EKG Time: 0030  Rate: 98  Rhythm: normal sinus rhythm  Axis: normal  Intervals:none  ST&T Change: none  ____________________________________________  RADIOLOGY  Dg Chest 2 View  Result Date: 11/21/2017 CLINICAL DATA:  Chest pain EXAM: CHEST  2 VIEW COMPARISON:  Chest radiograph 03/04/2017 FINDINGS: The heart size and mediastinal contours are within normal limits. Both lungs are clear. The visualized skeletal structures are unremarkable. IMPRESSION: No active cardiopulmonary disease. Electronically Signed   By: Deatra Robinson M.D.   On: 11/21/2017 00:51    ____________________________________________   PROCEDURES  Procedure(s) performed: None  Procedures  Critical Care performed: No  ____________________________________________   INITIAL IMPRESSION / ASSESSMENT AND PLAN / ED COURSE  As part of my medical decision making, I reviewed the following data within the electronic MEDICAL RECORD NUMBER Notes from prior ED visits and [place] Controlled Substance Database   This is a 37 year old female who comes into the hospital today with some right-sided chest pain and headache.  The patient states that she has not taken her blood pressure medicine in the past week and this likely was causing her symptoms.  She  states that she does have an appointment with a physician in 2 days but she wanted to get checked out.  The patient has had migraines in the past.  My differential diagnosis includes hypertensive urgency, acute coronary syndrome, hyperglycemia.  I did check some blood work on the patient.  She had a CBC a BMP in 2 troponins which were all unremarkable.  We also did a chest x-ray on the patient which was negative.  I did give the patient a nitroglycerin as well as some Reglan, Benadryl and Toradol.  Patient also received a liter of normal saline.  She was sleeping comfortably when I checked on her and her pain had improved.  The patient's blood pressure is also improved.  Given the fact that the patient's blood work is unremarkable and her symptoms are improved I feel that she may be discharged home.  She should follow-up with her doctor as she has scheduled and should resume taking her blood pressure medication.  The patient will be discharged home.      ____________________________________________   FINAL CLINICAL IMPRESSION(S) / ED DIAGNOSES  Final diagnoses:  Chest pain, unspecified type  Acute nonintractable headache, unspecified headache type  Hypertension, unspecified type     ED Discharge Orders    None       Note:  This document was prepared using Dragon voice recognition software and may include unintentional dictation errors.    Rebecka Apley, MD 11/21/17 [phone]

## 2017-12-05 IMAGING — CR DG CHEST 2V
2 series · 2 of 2 positions shown · non-contrast
Comparison: 09/22/2014

CLINICAL DATA: Worsening central chest pain beginning this morning.
Shortness of breath.

EXAM:
CHEST  2 VIEW

[chest pa]
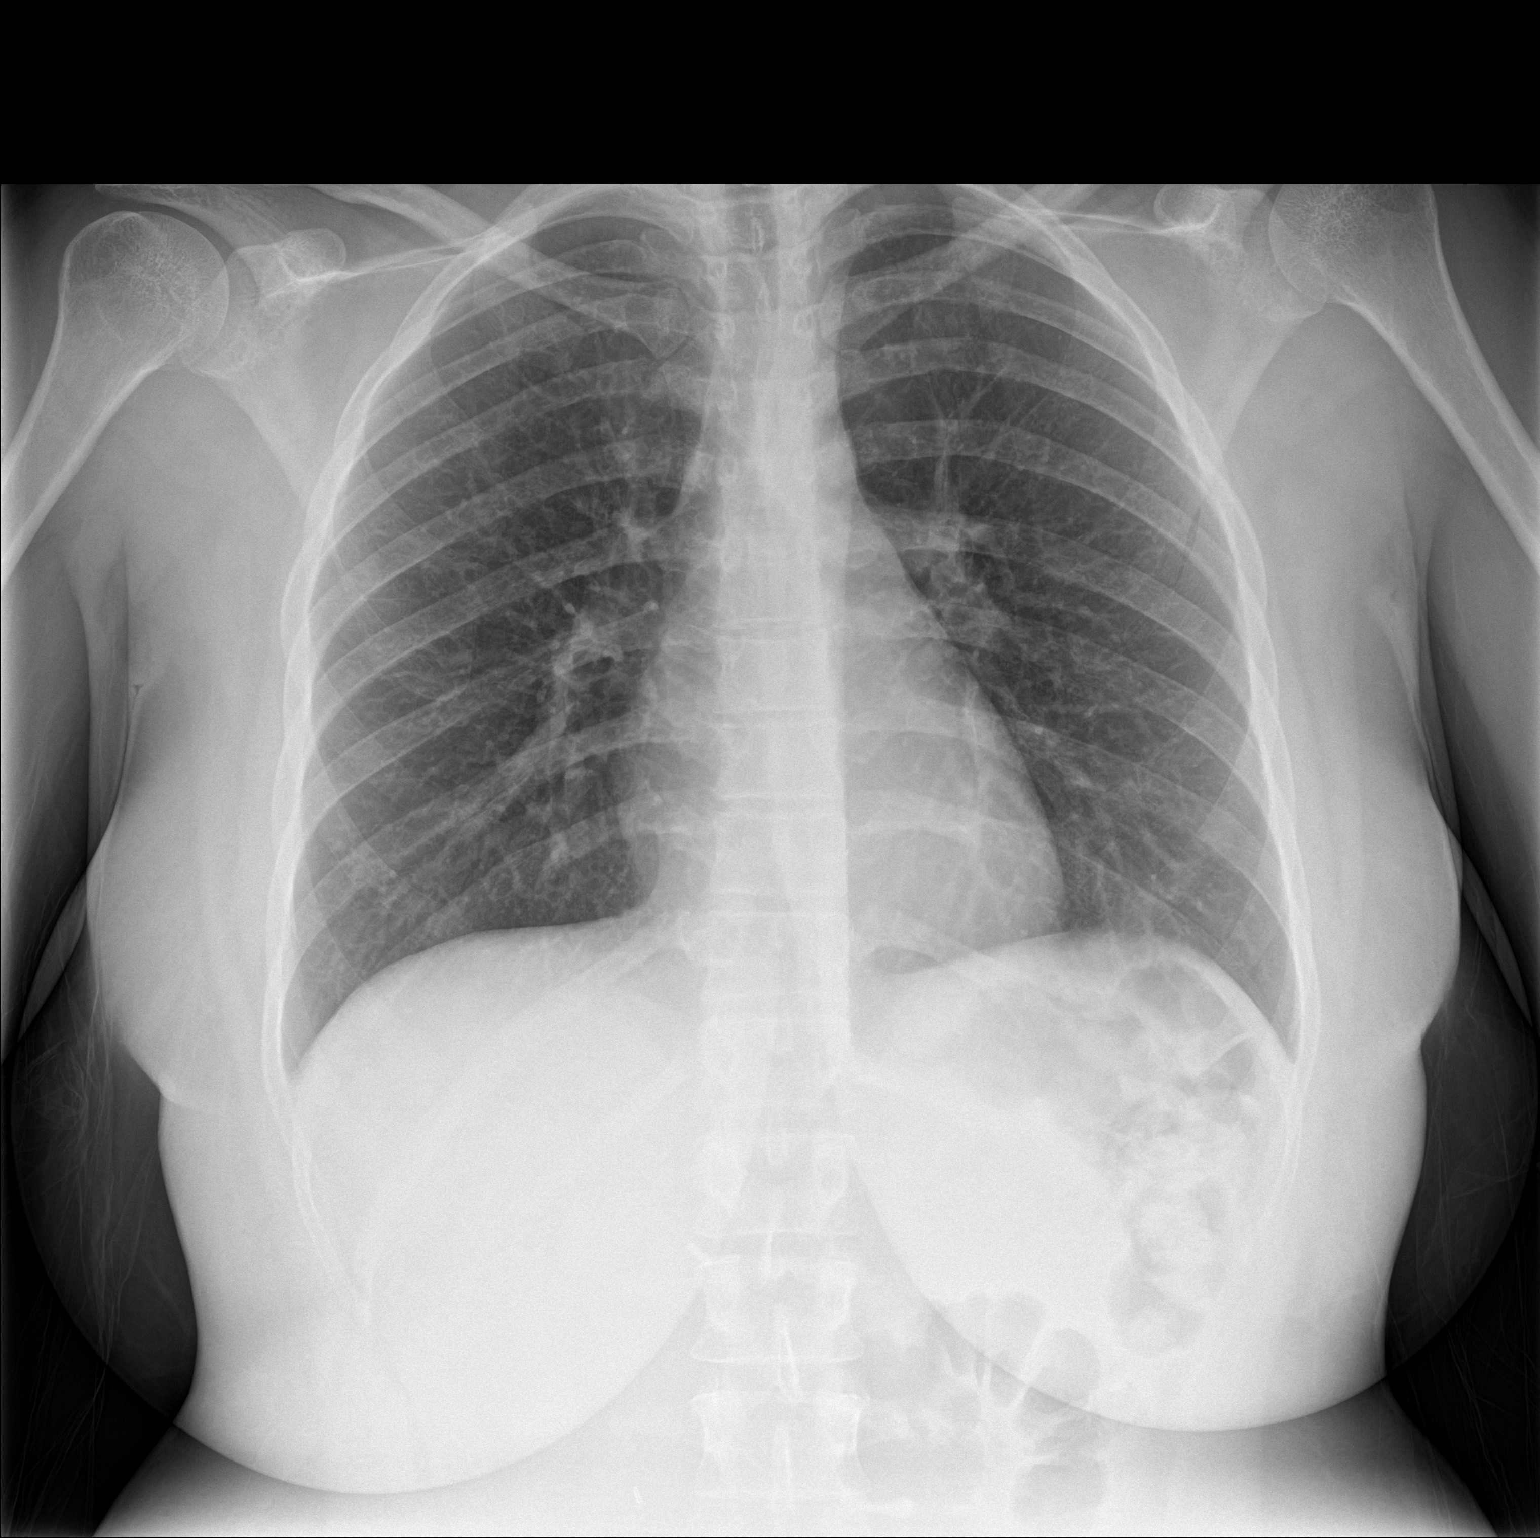

[chest lat]
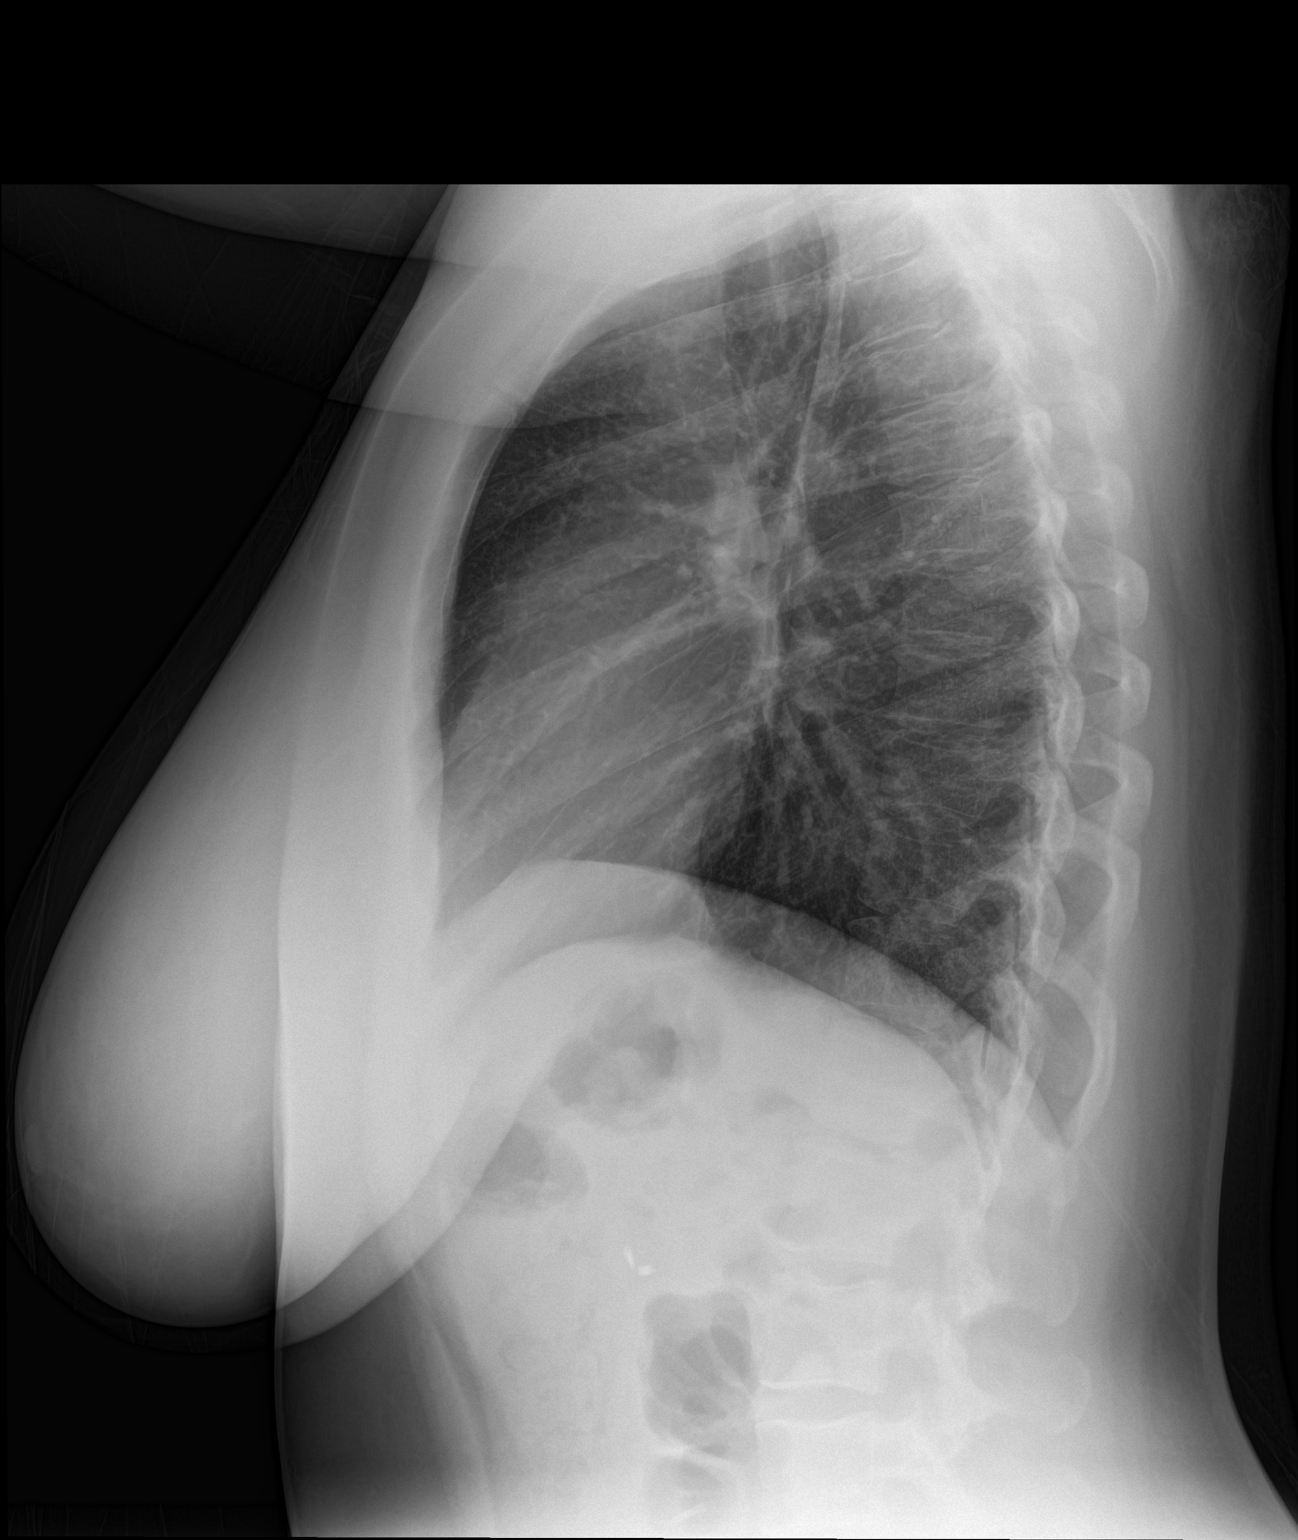

[2 of 2 positions shown; findings below may reference images not displayed]

FINDINGS: The heart size and mediastinal contours are within normal limits.
Both lungs are clear. The visualized skeletal structures are
unremarkable.
IMPRESSION: No active cardiopulmonary disease.

## 2018-01-12 ENCOUNTER — Emergency Department
Admission: EM | Admit: 2018-01-12 | Discharge: 2018-01-12 | Discharge status: Against Medical Advice | Payer: Medicaid Other | Attending: Emergency Medicine | Admitting: Emergency Medicine

## 2018-01-12 ENCOUNTER — Other Ambulatory Visit: Payer: Self-pay

## 2018-01-12 ENCOUNTER — Telehealth (HOSPITAL_COMMUNITY): Payer: Self-pay | Admitting: Emergency Medicine

## 2018-01-12 ENCOUNTER — Encounter: Payer: Self-pay | Admitting: Emergency Medicine

## 2018-01-12 DIAGNOSIS — Z5321 Procedure and treatment not carried out due to patient leaving prior to being seen by health care provider: Secondary | ICD-10-CM | POA: Insufficient documentation

## 2018-01-12 DIAGNOSIS — R110 Nausea: Secondary | ICD-10-CM | POA: Insufficient documentation

## 2018-01-12 LAB — LIPASE, BLOOD: Lipase: 27 U/L (ref 11–51)

## 2018-01-12 LAB — COMPREHENSIVE METABOLIC PANEL
ALBUMIN: 4.2 g/dL (ref 3.5–5.0)
ALK PHOS: 84 U/L (ref 38–126)
ALT: 27 U/L (ref 14–54)
AST: 36 U/L (ref 15–41)
Anion gap: 18 — ABNORMAL HIGH (ref 5–15)
BILIRUBIN TOTAL: 1.6 mg/dL — AB (ref 0.3–1.2)
BUN: 16 mg/dL (ref 6–20)
CALCIUM: 9.4 mg/dL (ref 8.9–10.3)
CO2: 18 mmol/L — ABNORMAL LOW (ref 22–32)
CREATININE: 1.14 mg/dL — AB (ref 0.44–1.00)
Chloride: 95 mmol/L — ABNORMAL LOW (ref 101–111)
GFR calc non Af Amer: >60 mL/min (ref >60)
GLUCOSE: 486 mg/dL — AB (ref 65–99)
Potassium: 4.4 mmol/L (ref 3.5–5.1)
SODIUM: 131 mmol/L — AB (ref 135–145)
Total Protein: 7.8 g/dL (ref 6.5–8.1)

## 2018-01-12 LAB — GLUCOSE, CAPILLARY: GLUCOSE-CAPILLARY: 492 mg/dL — AB (ref 65–99)

## 2018-01-12 LAB — CBC
HCT: 40.2 % (ref 35.0–47.0)
Hemoglobin: 13.1 g/dL (ref 12.0–16.0)
MCH: 30 pg (ref 26.0–34.0)
MCHC: 32.7 g/dL (ref 32.0–36.0)
MCV: 91.9 fL (ref 80.0–100.0)
PLATELETS: 269 10*3/uL (ref 150–440)
RBC: 4.37 MIL/uL (ref 3.80–5.20)
RDW: 14.5 % (ref 11.5–14.5)
WBC: 15.6 10*3/uL — ABNORMAL HIGH (ref 3.6–11.0)

## 2018-01-12 NOTE — ED Triage Notes (Signed)
Patient ambulatory to triage with steady gait, without difficulty or distress noted; pt reports nausea, generalized HA, generalized abd pain for last few hours

## 2018-01-12 NOTE — Telephone Encounter (Signed)
Called patient due to lwot to inquire about condition and follow up plans. Left message asking her to either call me or call pcp to review her lab results.

## 2018-07-25 ENCOUNTER — Emergency Department
Admission: EM | Admit: 2018-07-25 | Discharge: 2018-07-25 | Disposition: A | Discharge status: Home / Self Care | Payer: BC Managed Care – PPO | Attending: Emergency Medicine | Admitting: Emergency Medicine

## 2018-07-25 ENCOUNTER — Encounter: Payer: Self-pay | Admitting: Emergency Medicine

## 2018-07-25 ENCOUNTER — Other Ambulatory Visit: Payer: Self-pay

## 2018-07-25 DIAGNOSIS — I471 Supraventricular tachycardia: Secondary | ICD-10-CM | POA: Diagnosis not present

## 2018-07-25 DIAGNOSIS — B349 Viral infection, unspecified: Secondary | ICD-10-CM | POA: Diagnosis not present

## 2018-07-25 DIAGNOSIS — Z79899 Other long term (current) drug therapy: Secondary | ICD-10-CM | POA: Insufficient documentation

## 2018-07-25 DIAGNOSIS — E119 Type 2 diabetes mellitus without complications: Secondary | ICD-10-CM | POA: Insufficient documentation

## 2018-07-25 DIAGNOSIS — M791 Myalgia, unspecified site: Secondary | ICD-10-CM | POA: Diagnosis present

## 2018-07-25 DIAGNOSIS — Z794 Long term (current) use of insulin: Secondary | ICD-10-CM | POA: Diagnosis not present

## 2018-07-25 DIAGNOSIS — Z87891 Personal history of nicotine dependence: Secondary | ICD-10-CM | POA: Insufficient documentation

## 2018-07-25 DIAGNOSIS — I10 Essential (primary) hypertension: Secondary | ICD-10-CM | POA: Diagnosis not present

## 2018-07-25 LAB — INFLUENZA PANEL BY PCR (TYPE A & B)
INFLAPCR: NEGATIVE
Influenza B By PCR: NEGATIVE

## 2018-07-25 MED ORDER — TRAMADOL HCL 50 MG PO TABS: 50.0000 mg | ORAL_TABLET | Freq: Two times a day (BID) | ORAL | 0 refills | Status: DC | PRN

## 2018-07-25 MED ORDER — FEXOFENADINE HCL 60 MG PO TABS: 60.0000 mg | ORAL_TABLET | Freq: Two times a day (BID) | ORAL | 0 refills | Status: DC

## 2018-07-25 MED ORDER — BENZONATATE 100 MG PO CAPS
200.0000 mg | ORAL_CAPSULE | Freq: Once | ORAL | Status: AC
  Administered 2018-07-25: 200 mg via ORAL
  Filled 2018-07-25: qty 2

## 2018-07-25 MED ORDER — BENZONATATE 100 MG PO CAPS: 200.0000 mg | ORAL_CAPSULE | Freq: Three times a day (TID) | ORAL | 0 refills | Status: DC | PRN

## 2018-07-25 MED ORDER — KETOROLAC TROMETHAMINE 60 MG/2ML IM SOLN
60.0000 mg | Freq: Once | INTRAMUSCULAR | Status: AC
  Administered 2018-07-25: 60 mg via INTRAMUSCULAR
  Filled 2018-07-25: qty 2

## 2018-07-25 NOTE — ED Provider Notes (Signed)
[facility] Emergency Department Provider Note   ____________________________________________   First MD Initiated Contact with Patient 07/25/18 1102     (approximate)  I have reviewed the triage vital signs and the nursing notes.   HISTORY  Chief Complaint Generalized Body Aches    HPI Jody Chavez is a 37 y.o. female patient presents with 6 days of body aches, nasal congestion, sore throat, and nonproductive cough.  Patient denies nausea vomiting.  Patient state intimating diarrhea.  Patient state no relief with over-the-counter medications.  Patient rates her pain discomfort as a 7/10.  Patient describes her pain is "achy".  Past Medical History:  Diagnosis Date  . Anxiety   . Depressed   . Diabetes mellitus without complication (HCC)   . Hypertension   . IBS (irritable bowel syndrome)   . Paroxysmal SVT (supraventricular tachycardia) (HCC) WPW  . Wolff-Parkinson-White (WPW) syndrome     Patient Active Problem List   Diagnosis Date Noted  . DKA (diabetic ketoacidosis) (HCC) 11/03/2016    Past Surgical History:  Procedure Laterality Date  . ABDOMINAL SURGERY    . CARDIAC ELECTROPHYSIOLOGY MAPPING AND ABLATION    . CESAREAN SECTION    . CHOLECYSTECTOMY    . DIAGNOSTIC LAPAROSCOPY WITH REMOVAL OF ECTOPIC PREGNANCY Right 03/04/2016   Procedure: DIAGNOSTIC LAPAROSCOPY WITH right salpingectomy and REMOVAL OF ECTOPIC PREGNANCY, extensive lysis of adhesions;  Surgeon: Suzy Bouchard, MD;  Location: [facility] ORS;  Service: Gynecology;  Laterality: Right;  . ENDOMETRIAL ABLATION    . HEART ABLATION      Prior to Admission medications   Medication Sig Start Date End Date Taking? Authorizing Provider  amLODipine (NORVASC) 10 MG tablet Take 10 mg by mouth daily.    [provider]  benzonatate (TESSALON PERLES) 100 MG capsule Take 2 capsules (200 mg total) by mouth 3 (three) times daily as needed. 07/25/18 07/25/19  Joni Reining, PA-C    butalbital-acetaminophen-caffeine [place], ESGIC) [phone] MG tablet Take 1-2 tablets by mouth every 6 (six) hours as needed for headache. 11/21/17 11/21/18  Rebecka Apley, MD  fexofenadine (ALLEGRA) 60 MG tablet Take 1 tablet (60 mg total) by mouth 2 (two) times daily. 07/25/18   Joni Reining, PA-C  insulin glargine (LANTUS) 100 UNIT/ML injection Inject 13 Units into the skin 2 (two) times daily.    [provider]  insulin lispro (HUMALOG) 100 UNIT/ML injection Inject into the skin 3 (three) times daily before meals. Does 1 units per 10 carbs    [provider]  insulin NPH Human (HUMULIN N,NOVOLIN N) 100 UNIT/ML injection Inject 0.13 mLs (13 Units total) into the skin at bedtime. Patient not taking: Reported on 05/09/2017 11/04/16   Houston Siren, MD  insulin regular (HUMULIN R) 250 units/2.69mL (100 units/mL) injection Inject 0.01-0.1 mLs (1-10 Units total) into the skin 3 (three) times daily before meals. 1 units per 10 g of Carb. Patient not taking: Reported on 05/09/2017 11/04/16   Houston Siren, MD  Insulin Syringe-Needle U-100 (INSULIN SYRINGE .3CC/31GX5/16") 31G X 5/16" 0.3 ML MISC 5 times daily as directed. 11/04/16   Houston Siren, MD  lisinopril (PRINIVIL,ZESTRIL) 40 MG tablet Take 40 mg by mouth daily.     [provider]  naproxen (NAPROSYN) 500 MG tablet Take 500 mg by mouth 2 (two) times daily as needed.    [provider]  ondansetron (ZOFRAN ODT) 4 MG disintegrating tablet Take 1 tablet (4 mg total) by mouth every  8 (eight) hours as needed for nausea or vomiting. 04/04/17   Minna Antis, MD  sertraline (ZOLOFT) 50 MG tablet Take 50 mg by mouth at bedtime.     [provider]  traMADol (ULTRAM) 50 MG tablet Take 1 tablet (50 mg total) by mouth every 12 (twelve) hours as needed. 07/25/18   Joni Reining, PA-C    Allergies Iodine; Shellfish allergy; and Penicillins  Family History  Problem Relation Age of Onset  .  Gallbladder disease Mother     Social History Social History   Tobacco Use  . Smoking status: Former Games developer  . Smokeless tobacco: Never Used  Substance Use Topics  . Alcohol use: Yes    Comment: socially  . Drug use: No    Review of Systems Constitutional: No fever/chills Eyes: No visual changes. ENT: No sore throat. Cardiovascular: Denies chest pain. WPW Respiratory: Denies shortness of breath. Gastrointestinal: No abdominal pain.  No nausea, no vomiting.  No diarrhea.  No constipation. Genitourinary: Negative for dysuria. Musculoskeletal: Negative for back pain. Skin: Negative for rash. Neurological: Negative for headaches, focal weakness or numbness. Psychiatric:Anxiety and depression. Endocrine:Diabetes, hypertension, and IBS. Allergic/Immunilogical: Iodine, shellfish, penicillin. ____________________________________________   PHYSICAL EXAM:  VITAL SIGNS: ED Triage Vitals  Enc Vitals Group     BP      Pulse      Resp      Temp      Temp src      SpO2      Weight      Height      Head Circumference      Peak Flow      Pain Score      Pain Loc      Pain Edu?      Excl. in GC?     Constitutional: Alert and oriented. Well appearing and in no acute distress. Nose: Edematous nasal turbinates clear rhinorrhea.  Bilateral maxillary guarding. Mouth/Throat: Mucous membranes are moist.  Oropharynx non-erythematous.  Postnasal drainage.  Cardiovascular: Normal rate, regular rhythm. Grossly normal heart sounds.  Good peripheral circulation. Respiratory: Normal respiratory effort.  No retractions. Lungs CTAB. Gastrointestinal: Soft and nontender. No distention. No abdominal bruits. No CVA tenderness. Musculoskeletal: No lower extremity tenderness nor edema.  No joint effusions. Neurologic:  Normal speech and language. No gross focal neurologic deficits are appreciated. No gait instability. Skin:  Skin is warm, dry and intact. No rash noted. Psychiatric: Mood and  affect are normal. Speech and behavior are normal.  ____________________________________________   LABS (all labs ordered are listed, but only abnormal results are displayed)  Labs Reviewed  INFLUENZA PANEL BY PCR (TYPE A & B)   ____________________________________________  EKG   ____________________________________________  RADIOLOGY  ED MD interpretation:    Official radiology report(s): No results found.  ____________________________________________   PROCEDURES  Procedure(s) performed: None  Procedures  Critical Care performed: No  ____________________________________________   INITIAL IMPRESSION / ASSESSMENT AND PLAN / ED COURSE  As part of my medical decision making, I reviewed the following data within the electronic MEDICAL RECORD NUMBER    Viral upper respiratory infection.  Discussed negative flu results with patient.  Patient given discharge care instruction advised take medication as directed.  Patient advised follow-up PCP if no improvement in 3 to 5 days.      ____________________________________________   FINAL CLINICAL IMPRESSION(S) / ED DIAGNOSES  Final diagnoses:  Viral syndrome     ED Discharge Orders  Ordered    benzonatate (TESSALON PERLES) 100 MG capsule  3 times daily PRN     07/25/18 1238    fexofenadine (ALLEGRA) 60 MG tablet  2 times daily     07/25/18 1238    traMADol (ULTRAM) 50 MG tablet  Every 12 hours PRN     07/25/18 1238           Note:  This document was prepared using Dragon voice recognition software and may include unintentional dictation errors.    Joni Reining, PA-C 07/25/18 1245    Charlynne Pander, MD 07/25/18 [phone]

## 2018-07-25 NOTE — ED Triage Notes (Signed)
presents with sore throat,cough body aches for about 6 days  Having some discomfort in chest with cough and inspiration    Has been seen by PCP this week for same

## 2018-10-31 ENCOUNTER — Emergency Department
Admission: EM | Admit: 2018-10-31 | Discharge: 2018-10-31 | Disposition: A | Discharge status: Home / Self Care | Payer: BC Managed Care – PPO | Source: Home / Self Care | Attending: Emergency Medicine | Admitting: Emergency Medicine

## 2018-10-31 ENCOUNTER — Encounter: Payer: Self-pay | Admitting: Emergency Medicine

## 2018-10-31 ENCOUNTER — Other Ambulatory Visit: Payer: Self-pay

## 2018-10-31 ENCOUNTER — Emergency Department: Payer: BC Managed Care – PPO

## 2018-10-31 DIAGNOSIS — Z79899 Other long term (current) drug therapy: Secondary | ICD-10-CM

## 2018-10-31 DIAGNOSIS — I10 Essential (primary) hypertension: Secondary | ICD-10-CM

## 2018-10-31 DIAGNOSIS — R109 Unspecified abdominal pain: Secondary | ICD-10-CM | POA: Diagnosis not present

## 2018-10-31 DIAGNOSIS — E119 Type 2 diabetes mellitus without complications: Secondary | ICD-10-CM

## 2018-10-31 DIAGNOSIS — E1010 Type 1 diabetes mellitus with ketoacidosis without coma: Secondary | ICD-10-CM | POA: Diagnosis not present

## 2018-10-31 DIAGNOSIS — Z794 Long term (current) use of insulin: Secondary | ICD-10-CM | POA: Insufficient documentation

## 2018-10-31 DIAGNOSIS — Z87891 Personal history of nicotine dependence: Secondary | ICD-10-CM

## 2018-10-31 DIAGNOSIS — Y69 Unspecified misadventure during surgical and medical care: Secondary | ICD-10-CM

## 2018-10-31 DIAGNOSIS — T887XXA Unspecified adverse effect of drug or medicament, initial encounter: Secondary | ICD-10-CM

## 2018-10-31 LAB — BASIC METABOLIC PANEL
ANION GAP: 9 (ref 5–15)
BUN: 18 mg/dL (ref 6–20)
CALCIUM: 8.9 mg/dL (ref 8.9–10.3)
CO2: 27 mmol/L (ref 22–32)
Chloride: 102 mmol/L (ref 98–111)
Creatinine, Ser: 0.9 mg/dL (ref 0.44–1.00)
GFR calc Af Amer: >60 mL/min (ref >60)
GLUCOSE: 50 mg/dL — AB (ref 70–99)
Potassium: 3.4 mmol/L — ABNORMAL LOW (ref 3.5–5.1)
Sodium: 138 mmol/L (ref 135–145)

## 2018-10-31 LAB — CBC
HEMATOCRIT: 36.9 % (ref 36.0–46.0)
Hemoglobin: 12.2 g/dL (ref 12.0–15.0)
MCH: 29.9 pg (ref 26.0–34.0)
MCHC: 33.1 g/dL (ref 30.0–36.0)
MCV: 90.4 fL (ref 80.0–100.0)
NRBC: 0 % (ref 0.0–0.2)
Platelets: 263 10*3/uL (ref 150–400)
RBC: 4.08 MIL/uL (ref 3.87–5.11)
RDW: 13.9 % (ref 11.5–15.5)
WBC: 8.3 10*3/uL (ref 4.0–10.5)

## 2018-10-31 LAB — URINALYSIS, COMPLETE (UACMP) WITH MICROSCOPIC
Bilirubin Urine: NEGATIVE
Glucose, UA: NEGATIVE mg/dL
Hgb urine dipstick: NEGATIVE
Ketones, ur: NEGATIVE mg/dL
Nitrite: NEGATIVE
PH: 6 (ref 5.0–8.0)
Protein, ur: NEGATIVE mg/dL
SPECIFIC GRAVITY, URINE: 1.02 (ref 1.005–1.030)

## 2018-10-31 LAB — TROPONIN I: Troponin I: <0.03 ng/mL (ref <0.03)

## 2018-10-31 NOTE — ED Provider Notes (Signed)
[facility] Emergency Department Provider Note   ____________________________________________    I have reviewed the triage vital signs and the nursing notes.   HISTORY  Chief Complaint Fatigue     HPI Jody Chavez is a 38 y.o. female who presents with complaints of fatigue.  Patient reports over the last 2 to 3 days she has been falling asleep quite easily and she reports this is unusual for her.  She does work third shift.  She reports she fell asleep in her car after driving home and was slept for 3 to 4 hours.  She reports she recently started Abilify 1 week ago for depression.  She also notes that in the past her potassium has been slightly low and that had caused her to be fatigued but nothing like this.  No history of narcolepsy.  No reports of chest pain or shortness of breath   Past Medical History:  Diagnosis Date  . Anxiety   . Depressed   . Diabetes mellitus without complication (HCC)   . Hypertension   . IBS (irritable bowel syndrome)   . Paroxysmal SVT (supraventricular tachycardia) (HCC) WPW  . Wolff-Parkinson-White (WPW) syndrome     Patient Active Problem List   Diagnosis Date Noted  . DKA (diabetic ketoacidosis) (HCC) 11/03/2016    Past Surgical History:  Procedure Laterality Date  . ABDOMINAL SURGERY    . CARDIAC ELECTROPHYSIOLOGY MAPPING AND ABLATION    . CESAREAN SECTION    . CHOLECYSTECTOMY    . DIAGNOSTIC LAPAROSCOPY WITH REMOVAL OF ECTOPIC PREGNANCY Right 03/04/2016   Procedure: DIAGNOSTIC LAPAROSCOPY WITH right salpingectomy and REMOVAL OF ECTOPIC PREGNANCY, extensive lysis of adhesions;  Surgeon: Suzy Bouchardhomas J Schermerhorn, MD;  Location: [facility] ORS;  Service: Gynecology;  Laterality: Right;  . ENDOMETRIAL ABLATION    . HEART ABLATION      Prior to Admission medications   Medication Sig Start Date End Date Taking? Authorizing Provider  insulin degludec (TRESIBA) 100 UNIT/ML SOPN FlexTouch Pen Inject 24 Units into the  skin daily. 08/01/16  Yes [provider]  insulin lispro (HUMALOG KWIKPEN) 100 UNIT/ML KwikPen Inject 3-10 Units into the skin 4 (four) times daily. 08/01/16  Yes [provider]  pantoprazole (PROTONIX) 40 MG tablet Take 40 mg by mouth daily. 02/13/18 02/13/19 Yes [provider]  Polyethylene Glycol 3350 (PEG 3350) POWD Take 17 g by mouth 2 (two) times daily as needed. 02/13/18  Yes [provider]  amLODipine (NORVASC) 10 MG tablet Take 10 mg by mouth daily.    [provider]  benzonatate (TESSALON PERLES) 100 MG capsule Take 2 capsules (200 mg total) by mouth 3 (three) times daily as needed. 07/25/18 07/25/19  Joni ReiningSmith, Ronald K, PA-C  butalbital-acetaminophen-caffeine [place], ESGIC) [phone] MG tablet Take 1-2 tablets by mouth every 6 (six) hours as needed for headache. 11/21/17 11/21/18  Rebecka ApleyWebster, Allison P, MD  escitalopram (LEXAPRO) 20 MG tablet Take 20 mg by mouth daily.    [provider]  fexofenadine (ALLEGRA) 60 MG tablet Take 1 tablet (60 mg total) by mouth 2 (two) times daily. 07/25/18   Joni ReiningSmith, Ronald K, PA-C  hydrOXYzine (ATARAX/VISTARIL) 10 MG tablet Take 20 mg by mouth daily.    [provider]  insulin glargine (LANTUS) 100 UNIT/ML injection Inject 13 Units into the skin 2 (two) times daily.    [provider]  insulin NPH Human (HUMULIN N,NOVOLIN N) 100 UNIT/ML injection Inject 0.13 mLs (13 Units total) into the skin at  bedtime. Patient not taking: Reported on 05/09/2017 11/04/16   Houston Siren, MD  insulin regular (HUMULIN R) 250 units/2.33mL (100 units/mL) injection Inject 0.01-0.1 mLs (1-10 Units total) into the skin 3 (three) times daily before meals. 1 units per 10 g of Carb. Patient not taking: Reported on 05/09/2017 11/04/16   Houston Siren, MD  Insulin Syringe-Needle U-100 (INSULIN SYRINGE .3CC/31GX5/16") 31G X 5/16" 0.3 ML MISC 5 times daily as directed. 11/04/16   Houston Siren, MD  lisinopril  (PRINIVIL,ZESTRIL) 40 MG tablet Take 40 mg by mouth daily.     [provider]  naproxen (NAPROSYN) 500 MG tablet Take 500 mg by mouth 2 (two) times daily as needed.    [provider]  ondansetron (ZOFRAN ODT) 4 MG disintegrating tablet Take 1 tablet (4 mg total) by mouth every 8 (eight) hours as needed for nausea or vomiting. 04/04/17   Minna Antis, MD  sertraline (ZOLOFT) 50 MG tablet Take 50 mg by mouth at bedtime.     [provider]  traMADol (ULTRAM) 50 MG tablet Take 1 tablet (50 mg total) by mouth every 12 (twelve) hours as needed. 07/25/18   Joni Reining, PA-C     Allergies Iodine; Shellfish allergy; and Penicillins  Family History  Problem Relation Age of Onset  . Gallbladder disease Mother     Social History Social History   Tobacco Use  . Smoking status: Former Games developer  . Smokeless tobacco: Never Used  Substance Use Topics  . Alcohol use: Yes    Comment: socially  . Drug use: No    Review of Systems  Constitutional: No fever/chills Eyes: No visual changes.  ENT: No sore throat. Cardiovascular: Denies chest pain. Respiratory: Denies shortness of breath. Gastrointestinal: No abdominal pain.   Genitourinary: Negative for dysuria. Musculoskeletal: Negative for back pain. Skin: Negative for rash. Neurological: Negative for headaches or weakness   ____________________________________________   PHYSICAL EXAM:  VITAL SIGNS: ED Triage Vitals  Enc Vitals Group     BP 10/31/18 0923 (!) 147/101     Pulse Rate 10/31/18 0926 85     Resp 10/31/18 0926 15     Temp --      Temp src --      SpO2 10/31/18 0926 98 %     Weight 10/31/18 0838 78 kg (172 lb)     Height 10/31/18 0838 1.6 m (5\' 3" )     Head Circumference --      Peak Flow --      Pain Score 10/31/18 0838 0     Pain Loc --      Pain Edu? --      Excl. in GC? --     Constitutional: Alert and oriented. No acute distress. Pleasant and interactive Eyes: PERRLA,  EOMI Nose: No congestion/rhinnorhea. Mouth/Throat: Mucous membranes are moist.   Neck:  Painless ROM Cardiovascular: Normal rate, regular rhythm. Grossly normal heart sounds.  Good peripheral circulation. Respiratory: Normal respiratory effort.  No retractions.  Gastrointestinal: Soft and nontender. No distention.   Musculoskeletal: No lower extremity tenderness nor edema.  Warm and well perfused Neurologic:  Normal speech and language. No gross focal neurologic deficits are appreciated.  Skin:  Skin is warm, dry and intact. No rash noted. Psychiatric: Mood and affect are normal. Speech and behavior are normal.  ____________________________________________   LABS (all labs ordered are listed, but only abnormal results are displayed)  Labs Reviewed  URINALYSIS, COMPLETE (UACMP) WITH MICROSCOPIC - Abnormal;  Notable for the following components:      Result Value   Color, Urine YELLOW (*)    APPearance HAZY (*)    Leukocytes, UA TRACE (*)    Bacteria, UA RARE (*)    All other components within normal limits  BASIC METABOLIC PANEL - Abnormal; Notable for the following components:   Potassium 3.4 (*)    Glucose, Bld 50 (*)    All other components within normal limits  CBC  TROPONIN I  POC URINE PREG, ED   ____________________________________________  EKG  ED ECG REPORT I, Jene Everyobert Hanah Moultry, the attending physician, personally viewed and interpreted this ECG.  Date: 10/31/2018  Rhythm: normal sinus rhythm QRS Axis: normal Intervals: normal ST/T Wave abnormalities: normal Narrative Interpretation: no evidence of acute ischemia  ____________________________________________  RADIOLOGY  Chest x-ray unremarkable ____________________________________________   PROCEDURES  Procedure(s) performed: No  Procedures   Critical Care performed:No ____________________________________________   INITIAL IMPRESSION / ASSESSMENT AND PLAN / ED COURSE  Pertinent labs & imaging  results that were available during my care of the patient were reviewed by me and considered in my medical decision making (see chart for details).  Suspect patient's presentation is related to starting Abilify.  We will check labs but her exam is quite reassuring.  Recommended DC Abilify    ____________________________________________   FINAL CLINICAL IMPRESSION(S) / ED DIAGNOSES  Final diagnoses:  Medication side effect        Note:  This document was prepared using Dragon voice recognition software and may include unintentional dictation errors.   Jene EveryKinner, Syris Brookens, MD 10/31/18 [id number]

## 2018-10-31 NOTE — ED Notes (Signed)
Pt taken to xray at this time.

## 2018-10-31 NOTE — ED Triage Notes (Signed)
Here for fatigue, generalized weakness. Reports keeps falling asleep and will have to pull over when driving because so tired. Feels like when K is low in past.  Also c/o slight cough and right side chest pain. VSS. Ambulatory. NAD

## 2018-10-31 NOTE — Discharge Instructions (Signed)
Please stop taking the abilify to see if this resolves your fatigue. Please discuss alternatives with your primary physician

## 2018-10-31 NOTE — ED Notes (Signed)
Preg test result is negative

## 2018-10-31 NOTE — ED Notes (Signed)
Pt alert and oriented X4, active, cooperative, pt in NAD. RR even and unlabored, color WNL.  Pt informed to return if any life threatening symptoms occur.  Discharge and followup instructions reviewed. Ambulates safely. 

## 2018-11-01 ENCOUNTER — Other Ambulatory Visit: Payer: Self-pay

## 2018-11-01 ENCOUNTER — Encounter: Payer: Self-pay | Admitting: *Deleted

## 2018-11-01 ENCOUNTER — Inpatient Hospital Stay
Admission: EM | Admit: 2018-11-01 | Discharge: 2018-11-02 | DRG: 638 | Disposition: A | Discharge status: Home / Self Care | Payer: BC Managed Care – PPO | Attending: Internal Medicine | Admitting: Internal Medicine

## 2018-11-01 DIAGNOSIS — Z794 Long term (current) use of insulin: Secondary | ICD-10-CM

## 2018-11-01 DIAGNOSIS — Z9049 Acquired absence of other specified parts of digestive tract: Secondary | ICD-10-CM

## 2018-11-01 DIAGNOSIS — Z87891 Personal history of nicotine dependence: Secondary | ICD-10-CM | POA: Diagnosis not present

## 2018-11-01 DIAGNOSIS — K589 Irritable bowel syndrome without diarrhea: Secondary | ICD-10-CM | POA: Diagnosis present

## 2018-11-01 DIAGNOSIS — Z880 Allergy status to penicillin: Secondary | ICD-10-CM

## 2018-11-01 DIAGNOSIS — Z888 Allergy status to other drugs, medicaments and biological substances status: Secondary | ICD-10-CM

## 2018-11-01 DIAGNOSIS — E871 Hypo-osmolality and hyponatremia: Secondary | ICD-10-CM | POA: Diagnosis present

## 2018-11-01 DIAGNOSIS — F419 Anxiety disorder, unspecified: Secondary | ICD-10-CM | POA: Diagnosis present

## 2018-11-01 DIAGNOSIS — I10 Essential (primary) hypertension: Secondary | ICD-10-CM | POA: Diagnosis present

## 2018-11-01 DIAGNOSIS — E875 Hyperkalemia: Secondary | ICD-10-CM | POA: Diagnosis present

## 2018-11-01 DIAGNOSIS — Z91013 Allergy to seafood: Secondary | ICD-10-CM

## 2018-11-01 DIAGNOSIS — E1010 Type 1 diabetes mellitus with ketoacidosis without coma: Principal | ICD-10-CM | POA: Diagnosis present

## 2018-11-01 DIAGNOSIS — F329 Major depressive disorder, single episode, unspecified: Secondary | ICD-10-CM | POA: Diagnosis present

## 2018-11-01 DIAGNOSIS — R109 Unspecified abdominal pain: Secondary | ICD-10-CM | POA: Diagnosis present

## 2018-11-01 DIAGNOSIS — E1110 Type 2 diabetes mellitus with ketoacidosis without coma: Secondary | ICD-10-CM | POA: Diagnosis present

## 2018-11-01 DIAGNOSIS — N179 Acute kidney failure, unspecified: Secondary | ICD-10-CM | POA: Diagnosis present

## 2018-11-01 LAB — CBC
HCT: 45.3 % (ref 36.0–46.0)
HEMOGLOBIN: 14.2 g/dL (ref 12.0–15.0)
MCH: 29.8 pg (ref 26.0–34.0)
MCHC: 31.3 g/dL (ref 30.0–36.0)
MCV: 95 fL (ref 80.0–100.0)
Platelets: 301 10*3/uL (ref 150–400)
RBC: 4.77 MIL/uL (ref 3.87–5.11)
RDW: 14.2 % (ref 11.5–15.5)
WBC: 16.8 10*3/uL — ABNORMAL HIGH (ref 4.0–10.5)
nRBC: 0 % (ref 0.0–0.2)

## 2018-11-01 LAB — BLOOD GAS, VENOUS
Acid-base deficit: 15 mmol/L — ABNORMAL HIGH (ref 0.0–2.0)
Bicarbonate: 12.3 mmol/L — ABNORMAL LOW (ref 20.0–28.0)
O2 Saturation: 76.9 %
PCO2 VEN: 33 mmHg — AB (ref 44.0–60.0)
Patient temperature: 37
pH, Ven: 7.18 — CL (ref 7.250–7.430)
pO2, Ven: 53 mmHg — ABNORMAL HIGH (ref 32.0–45.0)

## 2018-11-01 LAB — COMPREHENSIVE METABOLIC PANEL
ALT: 26 U/L (ref 0–44)
AST: 36 U/L (ref 15–41)
Albumin: 4.2 g/dL (ref 3.5–5.0)
Alkaline Phosphatase: 100 U/L (ref 38–126)
Anion gap: 25 — ABNORMAL HIGH (ref 5–15)
BUN: 25 mg/dL — ABNORMAL HIGH (ref 6–20)
CO2: 12 mmol/L — ABNORMAL LOW (ref 22–32)
Calcium: 9.3 mg/dL (ref 8.9–10.3)
Chloride: 95 mmol/L — ABNORMAL LOW (ref 98–111)
Creatinine, Ser: 1.45 mg/dL — ABNORMAL HIGH (ref 0.44–1.00)
GFR calc non Af Amer: 47 mL/min — ABNORMAL LOW (ref >60)
GFR, EST AFRICAN AMERICAN: 54 mL/min — AB (ref >60)
Glucose, Bld: 677 mg/dL (ref 70–99)
Potassium: 5.2 mmol/L — ABNORMAL HIGH (ref 3.5–5.1)
Sodium: 132 mmol/L — ABNORMAL LOW (ref 135–145)
Total Bilirubin: 2 mg/dL — ABNORMAL HIGH (ref 0.3–1.2)
Total Protein: 7.7 g/dL (ref 6.5–8.1)

## 2018-11-01 LAB — BASIC METABOLIC PANEL
Anion gap: 18 — ABNORMAL HIGH (ref 5–15)
BUN: 23 mg/dL — ABNORMAL HIGH (ref 6–20)
CALCIUM: 8.5 mg/dL — AB (ref 8.9–10.3)
CO2: 15 mmol/L — ABNORMAL LOW (ref 22–32)
Chloride: 107 mmol/L (ref 98–111)
Creatinine, Ser: 1.46 mg/dL — ABNORMAL HIGH (ref 0.44–1.00)
GFR calc Af Amer: 53 mL/min — ABNORMAL LOW (ref >60)
GFR calc non Af Amer: 46 mL/min — ABNORMAL LOW (ref >60)
Glucose, Bld: 320 mg/dL — ABNORMAL HIGH (ref 70–99)
Potassium: 5 mmol/L (ref 3.5–5.1)
Sodium: 140 mmol/L (ref 135–145)

## 2018-11-01 LAB — POCT I-STAT, CHEM 8
BUN: 22 mg/dL — AB (ref 6–20)
Calcium, Ion: 1.1 mmol/L — ABNORMAL LOW (ref 1.15–1.40)
Chloride: 101 mmol/L (ref 98–111)
Creatinine, Ser: 1.1 mg/dL — ABNORMAL HIGH (ref 0.44–1.00)
Glucose, Bld: 679 mg/dL (ref 70–99)
HEMATOCRIT: 49 % — AB (ref 36.0–46.0)
Hemoglobin: 16.7 g/dL — ABNORMAL HIGH (ref 12.0–15.0)
Potassium: 5.2 mmol/L — ABNORMAL HIGH (ref 3.5–5.1)
Sodium: 130 mmol/L — ABNORMAL LOW (ref 135–145)
TCO2: 15 mmol/L — ABNORMAL LOW (ref 22–32)

## 2018-11-01 LAB — LIPASE, BLOOD
Lipase: 21 U/L (ref 11–51)
Lipase: 51 U/L (ref 11–51)

## 2018-11-01 LAB — GLUCOSE, CAPILLARY
Glucose-Capillary: 226 mg/dL — ABNORMAL HIGH (ref 70–99)
Glucose-Capillary: 599 mg/dL (ref 70–99)
Glucose-Capillary: >600 mg/dL (ref 70–99)

## 2018-11-01 LAB — MRSA PCR SCREENING: MRSA by PCR: NEGATIVE

## 2018-11-01 LAB — BETA-HYDROXYBUTYRIC ACID: Beta-Hydroxybutyric Acid: 7.43 mmol/L — ABNORMAL HIGH (ref 0.05–0.27)

## 2018-11-01 LAB — HCG, QUANTITATIVE, PREGNANCY: hCG, Beta Chain, Quant, S: <1 m[IU]/mL (ref <5)

## 2018-11-01 LAB — TROPONIN I: Troponin I: <0.03 ng/mL (ref <0.03)

## 2018-11-01 MED ORDER — ONDANSETRON HCL 4 MG PO TABS: 4.0000 mg | ORAL_TABLET | Freq: Four times a day (QID) | ORAL | Status: DC | PRN

## 2018-11-01 MED ORDER — SODIUM CHLORIDE 0.9 % IV BOLUS
1000.0000 mL | Freq: Once | INTRAVENOUS | Status: AC
  Administered 2018-11-01: 1000 mL via INTRAVENOUS

## 2018-11-01 MED ORDER — SERTRALINE HCL 50 MG PO TABS: 50.0000 mg | ORAL_TABLET | Freq: Every day | ORAL | Status: DC

## 2018-11-01 MED ORDER — ACETAMINOPHEN 650 MG RE SUPP: 650.0000 mg | Freq: Four times a day (QID) | RECTAL | Status: DC | PRN

## 2018-11-01 MED ORDER — MORPHINE SULFATE (PF) 2 MG/ML IV SOLN
2.0000 mg | Freq: Once | INTRAVENOUS | Status: AC
  Administered 2018-11-01: 2 mg via INTRAVENOUS
  Filled 2018-11-01: qty 1

## 2018-11-01 MED ORDER — INSULIN REGULAR(HUMAN) IN NACL 100-0.9 UT/100ML-% IV SOLN: INTRAVENOUS | Status: DC

## 2018-11-01 MED ORDER — SODIUM CHLORIDE 0.9 % IV SOLN
INTRAVENOUS | Status: AC
  Administered 2018-11-01: 22:00:00 via INTRAVENOUS

## 2018-11-01 MED ORDER — DEXTROSE-NACL 5-0.45 % IV SOLN: INTRAVENOUS | Status: DC

## 2018-11-01 MED ORDER — ACETAMINOPHEN 325 MG PO TABS
650.0000 mg | ORAL_TABLET | Freq: Four times a day (QID) | ORAL | Status: DC | PRN
  Administered 2018-11-01 – 2018-11-02 (×2): 650 mg via ORAL
  Filled 2018-11-01 (×2): qty 2

## 2018-11-01 MED ORDER — ESCITALOPRAM OXALATE 10 MG PO TABS
20.0000 mg | ORAL_TABLET | Freq: Every day | ORAL | Status: DC
  Administered 2018-11-02: 20 mg via ORAL
  Filled 2018-11-01: qty 2

## 2018-11-01 MED ORDER — POLYETHYLENE GLYCOL 3350 17 G PO PACK: 17.0000 g | PACK | Freq: Every day | ORAL | Status: DC | PRN

## 2018-11-01 MED ORDER — BUTALBITAL-APAP-CAFFEINE 50-325-40 MG PO TABS: 1.0000 | ORAL_TABLET | Freq: Four times a day (QID) | ORAL | Status: DC | PRN

## 2018-11-01 MED ORDER — ONDANSETRON HCL 4 MG/2ML IJ SOLN: 4.0000 mg | Freq: Four times a day (QID) | INTRAMUSCULAR | Status: DC | PRN

## 2018-11-01 MED ORDER — DEXTROSE-NACL 5-0.45 % IV SOLN
INTRAVENOUS | Status: DC
  Administered 2018-11-01: via INTRAVENOUS

## 2018-11-01 MED ORDER — ALBUTEROL SULFATE (2.5 MG/3ML) 0.083% IN NEBU: 2.5000 mg | INHALATION_SOLUTION | RESPIRATORY_TRACT | Status: DC | PRN

## 2018-11-01 MED ORDER — AMLODIPINE BESYLATE 10 MG PO TABS
10.0000 mg | ORAL_TABLET | Freq: Every day | ORAL | Status: DC
  Administered 2018-11-02: 10 mg via ORAL
  Filled 2018-11-01: qty 1

## 2018-11-01 MED ORDER — SODIUM CHLORIDE 0.9 % IV SOLN
INTRAVENOUS | Status: DC
  Administered 2018-11-01: 23:00:00 via INTRAVENOUS

## 2018-11-01 MED ORDER — SODIUM CHLORIDE 0.9% FLUSH
3.0000 mL | Freq: Two times a day (BID) | INTRAVENOUS | Status: DC
  Administered 2018-11-02: 3 mL via INTRAVENOUS

## 2018-11-01 MED ORDER — SODIUM CHLORIDE 0.9 % IV BOLUS: 2000.0000 mL | Freq: Once | INTRAVENOUS | Status: DC

## 2018-11-01 MED ORDER — ENOXAPARIN SODIUM 40 MG/0.4ML ~~LOC~~ SOLN
40.0000 mg | SUBCUTANEOUS | Status: DC
  Administered 2018-11-01: 40 mg via SUBCUTANEOUS
  Filled 2018-11-01: qty 0.4

## 2018-11-01 MED ORDER — INSULIN REGULAR(HUMAN) IN NACL 100-0.9 UT/100ML-% IV SOLN
INTRAVENOUS | Status: DC
  Administered 2018-11-01: 5.4 [IU]/h via INTRAVENOUS
  Filled 2018-11-01: qty 100

## 2018-11-01 MED ORDER — PANTOPRAZOLE SODIUM 40 MG PO TBEC
40.0000 mg | DELAYED_RELEASE_TABLET | Freq: Every day | ORAL | Status: DC
  Administered 2018-11-02: 40 mg via ORAL
  Filled 2018-11-01: qty 1

## 2018-11-01 MED ORDER — ONDANSETRON HCL 4 MG/2ML IJ SOLN
4.0000 mg | Freq: Once | INTRAMUSCULAR | Status: AC
  Administered 2018-11-01: 4 mg via INTRAVENOUS
  Filled 2018-11-01: qty 2

## 2018-11-01 MED ORDER — TRAMADOL HCL 50 MG PO TABS: 50.0000 mg | ORAL_TABLET | Freq: Four times a day (QID) | ORAL | Status: DC | PRN

## 2018-11-01 NOTE — H&P (Signed)
[employer] - [place] at [facility]   PATIENT NAME: Jody Chavez    MR#:  [id number]  DATE OF BIRTH:  Nov 08, 1980  DATE OF ADMISSION:  11/01/2018  PRIMARY CARE PHYSICIAN: Center, [facility]   REQUESTING/REFERRING PHYSICIAN: Dr. Fanny Bien  CHIEF COMPLAINT:   Chief Complaint  Patient presents with  . Abdominal Pain  . Emesis    HISTORY OF PRESENT ILLNESS:  Jody Chavez  is a 38 y.o. female with a known history of insulin-dependent diabetes mellitus, hypertension, SVT, depression presents to the emergency room complaining of fatigue and hyperglycemia.  Here her blood sugars were found to be 679 with pH low on VBG along with bicarb of 15 and increased anion gap.  Diagnosed with DKA and patient will be admitted to ICU.  She has not missed any insulin doses other than 1 dose of Lantus on Tuesday.  She was seen yesterday here for fatigue was found to be mildly hypoglycemic at 50 and was treated appropriately and discharged home.  Today she says all she was able to keep down was a little bit of chicken noodle soup.  No sugary or high carb diet.  Took her Levemir 35 units that she takes daily along with NovoLog 12 units pre-meal insulin. No signs of infection.  She has had intractable nausea and vomiting and mild abdominal pain.  Has had polyuria.  PAST MEDICAL HISTORY:   Past Medical History:  Diagnosis Date  . Anxiety   . Depressed   . Diabetes mellitus without complication (HCC)   . Hypertension   . IBS (irritable bowel syndrome)   . Paroxysmal SVT (supraventricular tachycardia) (HCC) WPW  . Wolff-Parkinson-White (WPW) syndrome     PAST SURGICAL HISTORY:   Past Surgical History:  Procedure Laterality Date  . ABDOMINAL SURGERY    . CARDIAC ELECTROPHYSIOLOGY MAPPING AND ABLATION    . CESAREAN SECTION    . CHOLECYSTECTOMY    . DIAGNOSTIC LAPAROSCOPY WITH REMOVAL OF ECTOPIC PREGNANCY Right 03/04/2016   Procedure: DIAGNOSTIC LAPAROSCOPY WITH right  salpingectomy and REMOVAL OF ECTOPIC PREGNANCY, extensive lysis of adhesions;  Surgeon: Suzy Bouchard, MD;  Location: [facility] ORS;  Service: Gynecology;  Laterality: Right;  . ENDOMETRIAL ABLATION    . HEART ABLATION      SOCIAL HISTORY:   Social History   Tobacco Use  . Smoking status: Former Games developer  . Smokeless tobacco: Never Used  Substance Use Topics  . Alcohol use: Yes    Comment: socially    FAMILY HISTORY:   Family History  Problem Relation Age of Onset  . Gallbladder disease Mother     DRUG ALLERGIES:   Allergies  Allergen Reactions  . Iodine Anaphylaxis  . Shellfish Allergy Anaphylaxis  . Penicillins Hives and Other (See Comments)    Has patient had a PCN reaction causing immediate rash, facial/tongue/throat swelling, SOB or lightheadedness with hypotension: No Has patient had a PCN reaction causing severe rash involving mucus membranes or skin necrosis: No Has patient had a PCN reaction that required hospitalization No Has patient had a PCN reaction occurring within the last 10 years: No If all of the above answers are "NO", then may proceed with Cephalosporin use.    REVIEW OF SYSTEMS:   Review of Systems  Constitutional: Positive for malaise/fatigue. Negative for chills, fever and weight loss.  HENT: Negative for hearing loss and nosebleeds.   Eyes: Negative for blurred vision, double vision and pain.  Respiratory: Negative for cough, hemoptysis, sputum production,  shortness of breath and wheezing.   Cardiovascular: Negative for chest pain, palpitations, orthopnea and leg swelling.  Gastrointestinal: Positive for abdominal pain, nausea and vomiting. Negative for constipation and diarrhea.  Genitourinary: Negative for dysuria and hematuria.  Musculoskeletal: Negative for back pain, falls and myalgias.  Skin: Negative for rash.  Neurological: Negative for dizziness, tremors, sensory change, speech change, focal weakness, seizures and headaches.   Endo/Heme/Allergies: Does not bruise/bleed easily.  Psychiatric/Behavioral: Positive for depression. Negative for memory loss. The patient is nervous/anxious.     MEDICATIONS AT HOME:   Prior to Admission medications   Medication Sig Start Date End Date Taking? Authorizing Provider  amLODipine (NORVASC) 10 MG tablet Take 10 mg by mouth daily.    [provider]  benzonatate (TESSALON PERLES) 100 MG capsule Take 2 capsules (200 mg total) by mouth 3 (three) times daily as needed. 07/25/18 07/25/19  Joni Reining, PA-C  butalbital-acetaminophen-caffeine [place], ESGIC) [phone] MG tablet Take 1-2 tablets by mouth every 6 (six) hours as needed for headache. 11/21/17 11/21/18  Rebecka Apley, MD  escitalopram (LEXAPRO) 20 MG tablet Take 20 mg by mouth daily.    [provider]  fexofenadine (ALLEGRA) 60 MG tablet Take 1 tablet (60 mg total) by mouth 2 (two) times daily. 07/25/18   Joni Reining, PA-C  hydrOXYzine (ATARAX/VISTARIL) 10 MG tablet Take 20 mg by mouth daily.    [provider]  insulin degludec (TRESIBA) 100 UNIT/ML SOPN FlexTouch Pen Inject 24 Units into the skin daily. 08/01/16   [provider]  insulin glargine (LANTUS) 100 UNIT/ML injection Inject 13 Units into the skin 2 (two) times daily.    [provider]  insulin lispro (HUMALOG KWIKPEN) 100 UNIT/ML KwikPen Inject 3-10 Units into the skin 4 (four) times daily. 08/01/16   [provider]  insulin NPH Human (HUMULIN N,NOVOLIN N) 100 UNIT/ML injection Inject 0.13 mLs (13 Units total) into the skin at bedtime. Patient not taking: Reported on 05/09/2017 11/04/16   Houston Siren, MD  insulin regular (HUMULIN R) 250 units/2.73mL (100 units/mL) injection Inject 0.01-0.1 mLs (1-10 Units total) into the skin 3 (three) times daily before meals. 1 units per 10 g of Carb. Patient not taking: Reported on 05/09/2017 11/04/16   Houston Siren, MD  Insulin Syringe-Needle U-100 (INSULIN  SYRINGE .3CC/31GX5/16") 31G X 5/16" 0.3 ML MISC 5 times daily as directed. 11/04/16   Houston Siren, MD  lisinopril (PRINIVIL,ZESTRIL) 40 MG tablet Take 40 mg by mouth daily.     [provider]  naproxen (NAPROSYN) 500 MG tablet Take 500 mg by mouth 2 (two) times daily as needed.    [provider]  ondansetron (ZOFRAN ODT) 4 MG disintegrating tablet Take 1 tablet (4 mg total) by mouth every 8 (eight) hours as needed for nausea or vomiting. 04/04/17   Minna Antis, MD  pantoprazole (PROTONIX) 40 MG tablet Take 40 mg by mouth daily. 02/13/18 02/13/19  [provider]  Polyethylene Glycol 3350 (PEG 3350) POWD Take 17 g by mouth 2 (two) times daily as needed. 02/13/18   [provider]  sertraline (ZOLOFT) 50 MG tablet Take 50 mg by mouth at bedtime.     [provider]  traMADol (ULTRAM) 50 MG tablet Take 1 tablet (50 mg total) by mouth every 12 (twelve) hours as needed. 07/25/18   Joni Reining, PA-C     VITAL SIGNS:  Blood pressure (!) 169/128, pulse (!) 130, temperature 98.3 F (36.8  C), temperature source Oral, resp. rate (!) 24, height 5\' 3"  (1.6 m), weight 78 kg, last menstrual period 10/22/2018, SpO2 99 %.  PHYSICAL EXAMINATION:  Physical Exam  GENERAL:  38 y.o.-year-old patient lying in the bed EYES: Pupils equal, round, reactive to light and accommodation. No scleral icterus. Extraocular muscles intact.  HEENT: Head atraumatic, normocephalic. Oropharynx and nasopharynx clear. No oropharyngeal erythema, dry oral mucosa  NECK:  Supple, no jugular venous distention. No thyroid enlargement, no tenderness.  LUNGS: Normal breath sounds bilaterally, no wheezing, rales, rhonchi. No use of accessory muscles of respiration.  CARDIOVASCULAR: S1, S2 . No murmurs, rubs, or gallops.  Tachycardia ABDOMEN: Soft, tender, nondistended. Bowel sounds present. No organomegaly or mass.  EXTREMITIES: No pedal edema, cyanosis, or clubbing. + 2 pedal & radial  pulses b/l.   NEUROLOGIC: Cranial nerves II through XII are intact. No focal Motor or sensory deficits appreciated b/l PSYCHIATRIC: The patient is alert and oriented x 3. Good affect.  SKIN: No obvious rash, lesion, or ulcer.   LABORATORY PANEL:   CBC Recent Labs  Lab 11/01/18 1856 11/01/18 1917  WBC 16.8*  --   HGB 14.2 16.7*  HCT 45.3 49.0*  PLT 301  --    ------------------------------------------------------------------------------------------------------------------  Chemistries  Recent Labs  Lab 10/31/18 0927 11/01/18 1917  NA 138 130*  K 3.4* 5.2*  CL 102 101  CO2 27  --   GLUCOSE 50* 679*  BUN 18 22*  CREATININE 0.90 1.10*  CALCIUM 8.9  --    ------------------------------------------------------------------------------------------------------------------  Cardiac Enzymes Recent Labs  Lab 10/31/18 0927  TROPONINI <0.03   ------------------------------------------------------------------------------------------------------------------  RADIOLOGY:  Dg Chest 2 View  Result Date: 10/31/2018 CLINICAL DATA:  Chest pain. EXAM: CHEST - 2 VIEW COMPARISON:  11/21/2017 FINDINGS: The heart size and mediastinal contours are within normal limits. Both lungs are clear. The visualized skeletal structures are unremarkable. IMPRESSION: Normal exam. Electronically Signed   By: Francene BoyersJames  Maxwell M.D.   On: 10/31/2018 09:30     IMPRESSION AND PLAN:   *DKA.  Etiology is unclear.  Patient did miss a dose of Levemir on Tuesday.  At this time we will bolus normal saline 2 more liters.  Start maintenance fluids.  Every 4 BMP.  Insulin drip.  Admit to stepdown area.  [facility]  Can resume Levemir 35 units once her DKA is resolved.  We will check a troponin as requested by healing.  *Hypertension.  Continue home medications  *Anxiety depression.  Home medications.  DVT prophylaxis with Lovenox.  All the records are reviewed and case discussed with ED  provider. Management plans discussed with the patient, family and they are in agreement.  CODE STATUS: Full code   TOTAL CRITICAL CARE TIME TAKING CARE OF THIS PATIENT: 40 minutes.   Molinda BailiffSrikar R Aldora Perman M.D on 11/01/2018 at 8:30 PM  Between 7am to 6pm - Pager - [phone]  After 6pm go to [email or web address] - password [email or web address]  [facility] [place] [facility]  Office  [phone]  CC: Primary care physician; [facility]  Note: This dictation was prepared with Dragon dictation along with smaller phrase technology. Any transcriptional errors that result from this process are unintentional.

## 2018-11-01 NOTE — ED Triage Notes (Addendum)
Pt to triage via wheelchair.  Pt has vomiting.  No diarrhea.  Pt has abd pain.  Pt has diabetes.  Pt alert.   Pt vomiting in triage.  fsbs 599.

## 2018-11-01 NOTE — ED Notes (Signed)
Pt vomiting at this time and had accident. Pt given clean bed linens and gown to clean up. Informed patient I will start fluids and give meds once she is cleaned up. MD aware

## 2018-11-01 NOTE — ED Notes (Signed)
Blood glucose 679 Sam, RN and Fanny Bien, MD notified at this time.

## 2018-11-01 NOTE — ED Provider Notes (Signed)
[facility] Emergency Department Provider Note   ____________________________________________   First MD Initiated Contact with Patient 11/01/18 1900     (approximate)  I have reviewed the triage vital signs and the nursing notes.   HISTORY  Chief Complaint Abdominal Pain and Emesis    HPI Agnes LawrenceShonda Mcconnell is a 38 y.o. female reports she has been feeling very fatigued and her blood sugar started running very high  Patient reports for several days she been very tired after work, she sometimes has to stop her car because she feels like she is still tired.  She was seen yesterday, she says she is sent home and she is just continue to be so tired and very fatigued and then today noticed her blood sugar was a little high this morning with a meter reading around 238, then this afternoon it shot up to very high.  She reports she is having abdominal discomfort nausea and feels like she wants to vomit.  Pain in the mid abdomen.  Denies pregnancy.  She has not noticed any fevers or chills.  She is been feeling very very fatigued, and it is notably worse tonight.  She did use her long-acting insulin and also took 15 units of short acting insulin earlier today, but despite this her blood sugars seem to keep go high.  She wonders if the medication Abilify could be contributing to some of the symptoms  She reports similar symptoms in the past when she has had problems with high blood sugars   Past Medical History:  Diagnosis Date  . Anxiety   . Depressed   . Diabetes mellitus without complication (HCC)   . Hypertension   . IBS (irritable bowel syndrome)   . Paroxysmal SVT (supraventricular tachycardia) (HCC) WPW  . Wolff-Parkinson-White (WPW) syndrome     Patient Active Problem List   Diagnosis Date Noted  . DKA (diabetic ketoacidosis) (HCC) 11/03/2016    Past Surgical History:  Procedure Laterality Date  . ABDOMINAL SURGERY    . CARDIAC ELECTROPHYSIOLOGY  MAPPING AND ABLATION    . CESAREAN SECTION    . CHOLECYSTECTOMY    . DIAGNOSTIC LAPAROSCOPY WITH REMOVAL OF ECTOPIC PREGNANCY Right 03/04/2016   Procedure: DIAGNOSTIC LAPAROSCOPY WITH right salpingectomy and REMOVAL OF ECTOPIC PREGNANCY, extensive lysis of adhesions;  Surgeon: Suzy Bouchardhomas J Schermerhorn, MD;  Location: [facility] ORS;  Service: Gynecology;  Laterality: Right;  . ENDOMETRIAL ABLATION    . HEART ABLATION      Prior to Admission medications   Medication Sig Start Date End Date Taking? Authorizing Provider  amLODipine (NORVASC) 10 MG tablet Take 10 mg by mouth daily.    [provider]  benzonatate (TESSALON PERLES) 100 MG capsule Take 2 capsules (200 mg total) by mouth 3 (three) times daily as needed. 07/25/18 07/25/19  Joni ReiningSmith, Ronald K, PA-C  butalbital-acetaminophen-caffeine [place], ESGIC) [phone] MG tablet Take 1-2 tablets by mouth every 6 (six) hours as needed for headache. 11/21/17 11/21/18  Rebecka ApleyWebster, Allison P, MD  escitalopram (LEXAPRO) 20 MG tablet Take 20 mg by mouth daily.    [provider]  fexofenadine (ALLEGRA) 60 MG tablet Take 1 tablet (60 mg total) by mouth 2 (two) times daily. 07/25/18   Joni ReiningSmith, Ronald K, PA-C  hydrOXYzine (ATARAX/VISTARIL) 10 MG tablet Take 20 mg by mouth daily.    [provider]  insulin degludec (TRESIBA) 100 UNIT/ML SOPN FlexTouch Pen Inject 24 Units into the skin daily. 08/01/16   [provider]  insulin glargine (  LANTUS) 100 UNIT/ML injection Inject 13 Units into the skin 2 (two) times daily.    [provider]  insulin lispro (HUMALOG KWIKPEN) 100 UNIT/ML KwikPen Inject 3-10 Units into the skin 4 (four) times daily. 08/01/16   [provider]  insulin NPH Human (HUMULIN N,NOVOLIN N) 100 UNIT/ML injection Inject 0.13 mLs (13 Units total) into the skin at bedtime. Patient not taking: Reported on 05/09/2017 11/04/16   Houston Siren, MD  insulin regular (HUMULIN R) 250 units/2.37mL (100 units/mL) injection  Inject 0.01-0.1 mLs (1-10 Units total) into the skin 3 (three) times daily before meals. 1 units per 10 g of Carb. Patient not taking: Reported on 05/09/2017 11/04/16   Houston Siren, MD  Insulin Syringe-Needle U-100 (INSULIN SYRINGE .3CC/31GX5/16") 31G X 5/16" 0.3 ML MISC 5 times daily as directed. 11/04/16   Houston Siren, MD  lisinopril (PRINIVIL,ZESTRIL) 40 MG tablet Take 40 mg by mouth daily.     [provider]  naproxen (NAPROSYN) 500 MG tablet Take 500 mg by mouth 2 (two) times daily as needed.    [provider]  ondansetron (ZOFRAN ODT) 4 MG disintegrating tablet Take 1 tablet (4 mg total) by mouth every 8 (eight) hours as needed for nausea or vomiting. 04/04/17   Minna Antis, MD  pantoprazole (PROTONIX) 40 MG tablet Take 40 mg by mouth daily. 02/13/18 02/13/19  [provider]  Polyethylene Glycol 3350 (PEG 3350) POWD Take 17 g by mouth 2 (two) times daily as needed. 02/13/18   [provider]  sertraline (ZOLOFT) 50 MG tablet Take 50 mg by mouth at bedtime.     [provider]  traMADol (ULTRAM) 50 MG tablet Take 1 tablet (50 mg total) by mouth every 12 (twelve) hours as needed. 07/25/18   Joni Reining, PA-C    Allergies Iodine; Shellfish allergy; and Penicillins  Family History  Problem Relation Age of Onset  . Gallbladder disease Mother     Social History Social History   Tobacco Use  . Smoking status: Former Games developer  . Smokeless tobacco: Never Used  Substance Use Topics  . Alcohol use: Yes    Comment: socially  . Drug use: No    Review of Systems Constitutional: No fever/chills but feeling just very tired and extremely fatigued to the point she really can barely get up Eyes: No visual changes. ENT: No sore throat. Cardiovascular: Denies chest pain. Respiratory: Denies shortness of breath. Gastrointestinal: Crampy pain in the mid abdomen associated with nausea Genitourinary: Negative for dysuria. Musculoskeletal:  Negative for back pain. Skin: Negative for rash. Neurological: Negative for headaches, areas of focal weakness or numbness.    ____________________________________________   PHYSICAL EXAM:  VITAL SIGNS: ED Triage Vitals  Enc Vitals Group     BP 11/01/18 1852 (!) 169/128     Pulse Rate 11/01/18 1852 (!) 130     Resp 11/01/18 1852 (!) 24     Temp 11/01/18 1852 98.3 F (36.8 C)     Temp Source 11/01/18 1852 Oral     SpO2 11/01/18 1852 99 %     Weight 11/01/18 1855 172 lb (78 kg)     Height 11/01/18 1855 5\' 3"  (1.6 m)     Head Circumference --      Peak Flow --      Pain Score 11/01/18 1854 8     Pain Loc --      Pain Edu? --      Excl. in GC? --  Constitutional: Alert and oriented.  She does not feel ill, very fatigued but in no acute distress. Eyes: Conjunctivae are normal. Head: Atraumatic. Nose: No congestion/rhinnorhea. Mouth/Throat: Mucous membranes are dry. Neck: No stridor.  Cardiovascular: Tachycardic rate, regular rhythm. Grossly normal heart sounds.  Good peripheral circulation. Respiratory: Normal respiratory effort except slightly tachypneic.  No retractions. Lungs CTAB. Gastrointestinal: Soft and nontender. No distention.  No pain elicited to palpation in any quadrant.  No rebound or guarding.  She reports that that intermittent mid abdominal pain that comes and goes. Musculoskeletal: No lower extremity tenderness nor edema. Neurologic:  Normal speech and language. No gross focal neurologic deficits are appreciated.  Skin:  Skin is slightly cool, dry and intact. No rash noted. Psychiatric: Mood and affect are normal. Speech and behavior are normal.  ____________________________________________   LABS (all labs ordered are listed, but only abnormal results are displayed)  Labs Reviewed  GLUCOSE, CAPILLARY - Abnormal; Notable for the following components:      Result Value   Glucose-Capillary 599 (*)    All other components within normal limits  CBC -  Abnormal; Notable for the following components:   WBC 16.8 (*)    All other components within normal limits  BLOOD GAS, VENOUS - Abnormal; Notable for the following components:   pH, Ven 7.18 (*)    pCO2, Ven 33 (*)    pO2, Ven 53.0 (*)    Bicarbonate 12.3 (*)    Acid-base deficit 15.0 (*)    All other components within normal limits  POCT I-STAT, CHEM 8 - Abnormal; Notable for the following components:   Sodium 130 (*)    Potassium 5.2 (*)    BUN 22 (*)    Creatinine, Ser 1.10 (*)    Glucose, Bld 679 (*)    Calcium, Ion 1.10 (*)    TCO2 15 (*)    Hemoglobin 16.7 (*)    HCT 49.0 (*)    All other components within normal limits  LIPASE, BLOOD  COMPREHENSIVE METABOLIC PANEL  URINALYSIS, COMPLETE (UACMP) WITH MICROSCOPIC  BETA-HYDROXYBUTYRIC ACID  HCG, QUANTITATIVE, PREGNANCY  I-STAT CHEM 8, ED  POC URINE PREG, ED  CBG MONITORING, ED  CBG MONITORING, ED  CBG MONITORING, ED  CBG MONITORING, ED  CBG MONITORING, ED  CBG MONITORING, ED  CBG MONITORING, ED  CBG MONITORING, ED  CBG MONITORING, ED  CBG MONITORING, ED  CBG MONITORING, ED  CBG MONITORING, ED   ____________________________________________  EKG  Reviewed and entered by me at 2015 Heart rate 120 QRS 110 QTc 440 Sinus tachycardia, no evidence of acute ischemia. ____________________________________________  RADIOLOGY   ____________________________________________   PROCEDURES  Procedure(s) performed: None  Procedures  Critical Care performed: Yes, see critical care note(s)  CRITICAL CARE Performed by: Sharyn CreamerMark    Total critical care time: 37 minutes  Critical care time was exclusive of separately billable procedures and treating other patients.  Critical care was necessary to treat or prevent imminent or life-threatening deterioration.  Critical care was time spent personally by me on the following activities: development of treatment plan with patient and/or surrogate as well as nursing,  discussions with consultants, evaluation of patient's response to treatment, examination of patient, obtaining history from patient or surrogate, ordering and performing treatments and interventions, ordering and review of laboratory studies, ordering and review of radiographic studies, pulse oximetry and re-evaluation of patient's condition.  Diabetic ketoacidosis with acidosis on lab testing. ____________________________________________   INITIAL IMPRESSION / ASSESSMENT AND PLAN / ED  COURSE  Pertinent labs & imaging results that were available during my care of the patient were reviewed by me and considered in my medical decision making (see chart for details).   Fatigue, tachycardia, elevated glucose.  Differential diagnosis certainly includes possible DKA, electrolyte abnormality, medication side effect, infectious etiologies.  Based on my clinical assessment, patient denies any cardiac or pulmonary symptoms.  Does report however fatigue, weakness, nausea and abdominal cramping.  Very reassuring clinical abdominal exam I doubt an acute intra-abdominal process.  Was recently seen yesterday and her labs were reviewed from then.  No evidence of UTI at that time.  Denies any urinary symptoms.  Denies pregnancy.    ----------------------------------------- 7:35 PM on 11/01/2018 -----------------------------------------  Labs returning at this time, appears likely diabetic ketoacidosis.  Acidosis, elevated glucose, nausea vomiting abdominal pain.  The clinical picture seems to fit that of DKA.  Initiating fluid boluses, transitioning to insulin infusion.  Potassium 5.2.  Patient given Zofran and morphine for abdominal discomfort and nausea.  Updated patient on need for admission, she is in agreement  ____________________________________________   FINAL CLINICAL IMPRESSION(S) / ED DIAGNOSES  Final diagnoses:  Diabetic ketoacidosis without coma associated with type 1 diabetes mellitus (HCC)         Note:  This document was prepared using Dragon voice recognition software and may include unintentional dictation errors       Sharyn Creamer, MD 11/01/18 2017

## 2018-11-01 NOTE — Progress Notes (Signed)
eLink Physician-Brief Progress Note Patient Name: Jody Chavez DOB: 12-08-1980 MRN: [id number]   Date of Service  11/01/2018  HPI/Events of Note  38 yr old female admitted for 10 DKA. No sepsis. Troponin neg. EKG Sinus tach. LFT normal. 2) HTN.  3) S/P ablation for WPS. MD notes reviewed.  On Insulin drip. Follow BMP q 4 hr. Lipase pending. On Lovenox as VTE.swithc to d5half NS once Bg < 200.   eICU Interventions  Continue care.      Intervention Category Major Interventions: Hyperglycemia - active titration of insulin therapy;Hypertension - evaluation and management Evaluation Type: New Patient Evaluation  Ranee Gosselin 11/01/2018, 10:53 PM

## 2018-11-02 DIAGNOSIS — E1010 Type 1 diabetes mellitus with ketoacidosis without coma: Principal | ICD-10-CM

## 2018-11-02 LAB — URINALYSIS, COMPLETE (UACMP) WITH MICROSCOPIC
Bilirubin Urine: NEGATIVE
Hgb urine dipstick: NEGATIVE
Ketones, ur: 80 mg/dL — AB
Leukocytes, UA: NEGATIVE
Nitrite: NEGATIVE
PROTEIN: 30 mg/dL — AB
Specific Gravity, Urine: 1.032 — ABNORMAL HIGH (ref 1.005–1.030)
pH: 5 (ref 5.0–8.0)

## 2018-11-02 LAB — BASIC METABOLIC PANEL
ANION GAP: 13 (ref 5–15)
ANION GAP: 9 (ref 5–15)
ANION GAP: 7 (ref 5–15)
Anion gap: 9 (ref 5–15)
BUN: 17 mg/dL (ref 6–20)
BUN: 15 mg/dL (ref 6–20)
BUN: 15 mg/dL (ref 6–20)
BUN: 20 mg/dL (ref 6–20)
CALCIUM: 8.3 mg/dL — AB (ref 8.9–10.3)
CO2: 15 mmol/L — ABNORMAL LOW (ref 22–32)
CO2: 19 mmol/L — ABNORMAL LOW (ref 22–32)
CO2: 18 mmol/L — ABNORMAL LOW (ref 22–32)
CO2: 18 mmol/L — ABNORMAL LOW (ref 22–32)
Calcium: 8.1 mg/dL — ABNORMAL LOW (ref 8.9–10.3)
Calcium: 8 mg/dL — ABNORMAL LOW (ref 8.9–10.3)
Calcium: 7.9 mg/dL — ABNORMAL LOW (ref 8.9–10.3)
Chloride: 108 mmol/L (ref 98–111)
Chloride: 108 mmol/L (ref 98–111)
Chloride: 107 mmol/L (ref 98–111)
Chloride: 103 mmol/L (ref 98–111)
Creatinine, Ser: 1.15 mg/dL — ABNORMAL HIGH (ref 0.44–1.00)
Creatinine, Ser: 1.03 mg/dL — ABNORMAL HIGH (ref 0.44–1.00)
Creatinine, Ser: 0.94 mg/dL (ref 0.44–1.00)
Creatinine, Ser: 0.9 mg/dL (ref 0.44–1.00)
GFR calc Af Amer: >60 mL/min (ref >60)
GFR calc Af Amer: >60 mL/min (ref >60)
GFR calc Af Amer: >60 mL/min (ref >60)
GFR calc Af Amer: >60 mL/min (ref >60)
GFR calc non Af Amer: >60 mL/min (ref >60)
GFR calc non Af Amer: >60 mL/min (ref >60)
Glucose, Bld: 188 mg/dL — ABNORMAL HIGH (ref 70–99)
Glucose, Bld: 160 mg/dL — ABNORMAL HIGH (ref 70–99)
Glucose, Bld: 262 mg/dL — ABNORMAL HIGH (ref 70–99)
Glucose, Bld: 385 mg/dL — ABNORMAL HIGH (ref 70–99)
Potassium: 4.3 mmol/L (ref 3.5–5.1)
Potassium: 4.1 mmol/L (ref 3.5–5.1)
Potassium: 3.7 mmol/L (ref 3.5–5.1)
Potassium: 4.1 mmol/L (ref 3.5–5.1)
Sodium: 136 mmol/L (ref 135–145)
Sodium: 136 mmol/L (ref 135–145)
Sodium: 132 mmol/L — ABNORMAL LOW (ref 135–145)
Sodium: 130 mmol/L — ABNORMAL LOW (ref 135–145)

## 2018-11-02 LAB — URINE DRUG SCREEN, QUALITATIVE (ARMC ONLY)
Amphetamines, Ur Screen: NOT DETECTED
Barbiturates, Ur Screen: NOT DETECTED
Benzodiazepine, Ur Scrn: NOT DETECTED
COCAINE METABOLITE, UR ~~LOC~~: NOT DETECTED
Cannabinoid 50 Ng, Ur ~~LOC~~: POSITIVE — AB
MDMA (ECSTASY) UR SCREEN: NOT DETECTED
Methadone Scn, Ur: NOT DETECTED
Opiate, Ur Screen: POSITIVE — AB
Phencyclidine (PCP) Ur S: NOT DETECTED
Tricyclic, Ur Screen: NOT DETECTED

## 2018-11-02 LAB — GLUCOSE, CAPILLARY
GLUCOSE-CAPILLARY: 130 mg/dL — AB (ref 70–99)
GLUCOSE-CAPILLARY: 204 mg/dL — AB (ref 70–99)
Glucose-Capillary: 148 mg/dL — ABNORMAL HIGH (ref 70–99)
Glucose-Capillary: 161 mg/dL — ABNORMAL HIGH (ref 70–99)
Glucose-Capillary: 166 mg/dL — ABNORMAL HIGH (ref 70–99)
Glucose-Capillary: 210 mg/dL — ABNORMAL HIGH (ref 70–99)
Glucose-Capillary: 301 mg/dL — ABNORMAL HIGH (ref 70–99)
Glucose-Capillary: 147 mg/dL — ABNORMAL HIGH (ref 70–99)
Glucose-Capillary: 146 mg/dL — ABNORMAL HIGH (ref 70–99)
Glucose-Capillary: 174 mg/dL — ABNORMAL HIGH (ref 70–99)
Glucose-Capillary: 154 mg/dL — ABNORMAL HIGH (ref 70–99)
Glucose-Capillary: 333 mg/dL — ABNORMAL HIGH (ref 70–99)
Glucose-Capillary: 307 mg/dL — ABNORMAL HIGH (ref 70–99)
Glucose-Capillary: 197 mg/dL — ABNORMAL HIGH (ref 70–99)

## 2018-11-02 LAB — CBC
HEMATOCRIT: 37.8 % (ref 36.0–46.0)
Hemoglobin: 12.3 g/dL (ref 12.0–15.0)
MCH: 29.9 pg (ref 26.0–34.0)
MCHC: 32.5 g/dL (ref 30.0–36.0)
MCV: 92 fL (ref 80.0–100.0)
Platelets: 276 10*3/uL (ref 150–400)
RBC: 4.11 MIL/uL (ref 3.87–5.11)
RDW: 14.2 % (ref 11.5–15.5)
WBC: 20.5 10*3/uL — ABNORMAL HIGH (ref 4.0–10.5)
nRBC: 0 % (ref 0.0–0.2)

## 2018-11-02 MED ORDER — INSULIN ASPART 100 UNIT/ML ~~LOC~~ SOLN
SUBCUTANEOUS | Status: AC
  Filled 2018-11-02: qty 1

## 2018-11-02 MED ORDER — ARIPIPRAZOLE 10 MG PO TABS
10.0000 mg | ORAL_TABLET | Freq: Every day | ORAL | Status: DC
  Administered 2018-11-02: 10 mg via ORAL
  Filled 2018-11-02: qty 1

## 2018-11-02 MED ORDER — MENTHOL 3 MG MT LOZG
1.0000 | LOZENGE | OROMUCOSAL | Status: DC | PRN
  Administered 2018-11-02: 3 mg via ORAL
  Filled 2018-11-02: qty 9

## 2018-11-02 MED ORDER — INSULIN ASPART 100 UNIT/ML ~~LOC~~ SOLN
0.0000 [IU] | Freq: Three times a day (TID) | SUBCUTANEOUS | Status: DC
  Administered 2018-11-02: 7 [IU] via SUBCUTANEOUS
  Administered 2018-11-02: 2 [IU] via SUBCUTANEOUS
  Filled 2018-11-02: qty 1

## 2018-11-02 MED ORDER — LISINOPRIL 40 MG PO TABS: 40.0000 mg | ORAL_TABLET | Freq: Every day | ORAL | 0 refills | Status: AC

## 2018-11-02 MED ORDER — INSULIN DETEMIR 100 UNIT/ML ~~LOC~~ SOLN
23.0000 [IU] | SUBCUTANEOUS | Status: DC
  Administered 2018-11-02: 23 [IU] via SUBCUTANEOUS
  Filled 2018-11-02 (×2): qty 0.23

## 2018-11-02 MED ORDER — FLUCONAZOLE 50 MG PO TABS
150.0000 mg | ORAL_TABLET | Freq: Once | ORAL | Status: AC
  Administered 2018-11-02: 150 mg via ORAL
  Filled 2018-11-02: qty 3

## 2018-11-02 MED ORDER — INSULIN ASPART 100 UNIT/ML ~~LOC~~ SOLN: 0.0000 [IU] | Freq: Three times a day (TID) | SUBCUTANEOUS | Status: DC

## 2018-11-02 MED ORDER — LISINOPRIL 20 MG PO TABS: 40.0000 mg | ORAL_TABLET | Freq: Every day | ORAL | Status: DC

## 2018-11-02 MED ORDER — INSULIN ASPART 100 UNIT/ML ~~LOC~~ SOLN: 0.0000 [IU] | Freq: Every day | SUBCUTANEOUS | Status: DC

## 2018-11-02 MED ORDER — TRAMADOL HCL 50 MG PO TABS: 50.0000 mg | ORAL_TABLET | Freq: Four times a day (QID) | ORAL | 0 refills | Status: DC | PRN

## 2018-11-02 NOTE — Progress Notes (Addendum)
Inpatient Diabetes Program Recommendations  AACE/ADA: New Consensus Statement on Inpatient Glycemic Control (2019)  Target Ranges:  Prepandial:   less than 140 mg/dL      Peak postprandial:   less than 180 mg/dL (1-2 hours)      Critically ill patients:  140 - 180 mg/dL   Results for Jody Chavez, Jody Chavez (MRN [id number]) as of 11/02/2018 07:58  Ref. Range 11/02/2018 00:34 11/02/2018 01:36 11/02/2018 02:41 11/02/2018 03:44 11/02/2018 04:53 11/02/2018 05:53 11/02/2018 06:49 11/02/2018 07:53  Glucose-Capillary Latest Ref Range: 70 - 99 mg/dL 366 (H) 815 (H) 947 (H) 148 (H) 147 (H) 130 (H) 146 (H) 174 (H)   Results for Jody Chavez, Jody Chavez (MRN [id number]) as of 11/02/2018 07:58  Ref. Range 11/01/2018 18:56  Beta-Hydroxybutyric Acid Latest Ref Range: 0.05 - 0.27 mmol/L 7.43 (H)  Glucose Latest Ref Range: 70 - 99 mg/dL 373 (HH)   Review of Glycemic Control  Diabetes history: DM1 Outpatient Diabetes medications: Levemir 36 units daily, Novolog QID Current orders for Inpatient glycemic control: IV insulin drip per DKA order set  Inpatient Diabetes Program Recommendations:  Insulin - IV drip/GlucoStabilizer: Per labs on 11/02/18 @ 2:10 am, CO2 15 and AG 13. IV insulin to be continued until acidosis is resolved as determined by MD.  At time of transition from IV to SQ insulin: Once MD is ready to transition patient from IV to SQ insulin, please consider ordering Levemir 23 units Q24H (based on 76.9 kg x 0.3 units), Novolog 0-9 units TID with meals, Novolog 0-5 units QHS, Novolog 4 units TID with meals for meal coverage if patient eats at least 50% of meals.  HgbA1C: Please consider ordering an A1C to evaluate glycemic control over the past 2-3 months.  Addendum 11/02/18@14 :30-Spoke with patient about diabetes and home regimen for diabetes control. Patient reports that she has DM1 and is followed by PCP for diabetes management and currently she takes Levemir 36 units QAM and Novolog 1 unit for every 10 grams of carbs with meals as an  outpatient for diabetes control. Inquired about correction insulin and patient states that when she use to see an Endocrinologist that she used a correction scale but her PCP does not have her on one. Patient reports that she takes additional Novolog units if her glucose is elevated but not able to provide any specific information on how she determines how many extra units to take.  Patient reports that she is consistently taking insulin and she does not skip doses. Patient states that she checks glucose 3-4 times per day and that it is usually in the 200's mg/dl and that her average glucose is around 250 mg/dl.  Inquired about prior A1C and patient reports that her last A1C value was 8% which she states is good for her.  Discussed glucose and A1C goals. Discussed importance of checking CBGs and maintaining good CBG control to prevent long-term and short-term complications. Explained how hyperglycemia leads to damage within blood vessels which lead to the common complications seen with uncontrolled diabetes. Stressed to the patient the importance of improving glycemic control to prevent further complications from uncontrolled diabetes. In talking with patient she notes that she usually uses her abdomen for insulin injections. Examined patient's abdomen and no hard knots of scar tissue noted but noted scar tissue from prior surgeries and stretch marks. Discussed insulin injection site rotation and encouraged patient not to inject insulin near any scar tissue.  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 Patient also notes that she does not feel that the Novolog 1 unit for every 10 grams of carbs is enough meal coverage insulin. Discussed FreeStyle Libre (flash glucose monitoring sensor) and encouraged patient to ask PCP about prescribing for her if she is interested as it would provide more information on glycemic control.  Encouraged patient to check glucose 3-4  times per day (before meals and at bedtime) and to keep a log book of glucose readings and insulin taken which she will need to take to doctor appointments. Patient reports that her PCP has referred her to Endocrinology. Patient states that she use to see Dr. Tedd Sias and was on an insulin pump. Patient is interested in going back on an insulin pump. Encouraged patient to follow up and make sure the referral has been made and to follow through with seeing an Endocrinologist.  Patient verbalized understanding of information discussed and she states that she has no further questions at this time related to diabetes.  Thanks, Orlando Penner, RN, MSN, CDE Diabetes Coordinator Inpatient Diabetes Program [phone] (Team Pager from 8am to 5pm)

## 2018-11-02 NOTE — Discharge Summary (Signed)
[facility] - [place] at [facility]  Jody Chavez, [place]37 y.o., DOB 1981/03/29, MRN [id number]. Admission date: 11/01/2018 Discharge Date 11/02/2018 Primary MD [facility] Admitting Physician Milagros LollSrikar Sudini, MD  Admission Diagnosis  Diabetic ketoacidosis without coma associated with type 1 diabetes mellitus (HCC) [E10.10]  Discharge Diagnosis   Active Problems:   DKA (diabetic ketoacidoses) (HCC) Essential hypertension Anxiety depression Leukocytosis due to DKA          Hospital Course Jody Chavez  is a 38 y.o. female with a known history of insulin-dependent diabetes mellitus, hypertension, SVT, depression presents to the emergency room complaining of fatigue and hyperglycemia.  Patient was noted to be in DKA and was started on insulin drip.  Patient's anion gap resolved.  She is not nauseous or vomiting anymore and stable for discharge.             Consults  pulmonary/intensive care  Significant Tests:  See full reports for all details     Dg Chest 2 View  Result Date: 10/31/2018 CLINICAL DATA:  Chest pain. EXAM: CHEST - 2 VIEW COMPARISON:  11/21/2017 FINDINGS: The heart size and mediastinal contours are within normal limits. Both lungs are clear. The visualized skeletal structures are unremarkable. IMPRESSION: Normal exam. Electronically Signed   By: Francene BoyersJames  Maxwell M.D.   On: 10/31/2018 09:30       Today   Subjective:   Jody Chavez patient feels better wants to go home Objective:   Blood pressure 118/82, pulse 99, temperature 97.8 F (36.6 C), temperature source Oral, resp. rate 15, height 5\' 3"  (1.6 m), weight 76.9 kg, last menstrual period 10/22/2018, SpO2 96 %.  .  Intake/Output Summary (Last 24 hours) at 11/02/2018 1707 Last data filed at 11/02/2018 1418 Gross per 24 hour  Intake 1058.29 ml  Output 750 ml  Net 308.29 ml    Exam VITAL SIGNS: Blood pressure 118/82, pulse 99, temperature 97.8 F (36.6 C),  temperature source Oral, resp. rate 15, height 5\' 3"  (1.6 m), weight 76.9 kg, last menstrual period 10/22/2018, SpO2 96 %.  GENERAL:  38 y.o.-year-old patient lying in the bed with no acute distress.  EYES: Pupils equal, round, reactive to light and accommodation. No scleral icterus. Extraocular muscles intact.  HEENT: Head atraumatic, normocephalic. Oropharynx and nasopharynx clear.  NECK:  Supple, no jugular venous distention. No thyroid enlargement, no tenderness.  LUNGS: Normal breath sounds bilaterally, no wheezing, rales,rhonchi or crepitation. No use of accessory muscles of respiration.  CARDIOVASCULAR: S1, S2 normal. No murmurs, rubs, or gallops.  ABDOMEN: Soft, nontender, nondistended. Bowel sounds present. No organomegaly or mass.  EXTREMITIES: No pedal edema, cyanosis, or clubbing.  NEUROLOGIC: Cranial nerves II through XII are intact. Muscle strength 5/5 in all extremities. Sensation intact. Gait not checked.  PSYCHIATRIC: The patient is alert and oriented x 3.  SKIN: No obvious rash, lesion, or ulcer.   Data Review     CBC w Diff:  Lab Results  Component Value Date   WBC 20.5 (H) 11/02/2018   HGB 12.3 11/02/2018   HGB 12.7 01/20/2015   HCT 37.8 11/02/2018   HCT 38.5 01/20/2015   PLT 276 11/02/2018   PLT 246 01/20/2015   LYMPHOPCT 28 10/01/2015   LYMPHOPCT 21.5 01/20/2015   MONOPCT 7 10/01/2015   MONOPCT 8.7 01/20/2015   EOSPCT 2 10/01/2015   EOSPCT 1.1 01/20/2015   BASOPCT 1 10/01/2015   BASOPCT 0.5 01/20/2015   CMP:  Lab Results  Component Value Date  NA 130 (L) 11/02/2018   NA 131 (L) 01/20/2015   K 4.1 11/02/2018   K 3.8 01/20/2015   CL 103 11/02/2018   CL 97 (L) 01/20/2015   CO2 18 (L) 11/02/2018   CO2 28 01/20/2015   BUN 20 11/02/2018   BUN 15 01/20/2015   CREATININE 0.90 11/02/2018   CREATININE 0.75 01/20/2015   PROT 7.7 11/01/2018   PROT 7.4 01/20/2015   ALBUMIN 4.2 11/01/2018   ALBUMIN 4.2 01/20/2015   BILITOT 2.0 (H) 11/01/2018   BILITOT  0.7 01/20/2015   ALKPHOS 100 11/01/2018   ALKPHOS 55 01/20/2015   AST 36 11/01/2018   AST 20 01/20/2015   ALT 26 11/01/2018   ALT 16 01/20/2015  .  Micro Results Recent Results (from the past 240 hour(s))  MRSA PCR Screening     Status: None   Collection Time: 11/01/18  9:32 PM  Result Value Ref Range Status   MRSA by PCR NEGATIVE NEGATIVE Final    Comment:        The GeneXpert MRSA Assay (FDA approved for NASAL specimens only), is one component of a comprehensive MRSA colonization surveillance program. It is not intended to diagnose MRSA infection nor to guide or monitor treatment for MRSA infections. Performed at [facility], [address] [place], [place] [address]         Code Status Orders  (From admission, onward)         Start     Ordered   11/01/18 2027  Full code  Continuous     11/01/18 2027        Code Status History    Date Active Date Inactive Code Status Order ID Comments User Context   11/03/2016 1341 11/04/2016 2048 Full Code [id number]  Katha Hamming, MD ED          Follow-up Information    Center, [facility] Follow up in 5 day(s).   Contact information: [address] [place] [address] [phone]           Discharge Medications   Allergies as of 11/02/2018      Reactions   Iodine Anaphylaxis   Shellfish Allergy Anaphylaxis   Penicillins Hives, Other (See Comments)   Has patient had a PCN reaction causing immediate rash, facial/tongue/throat swelling, SOB or lightheadedness with hypotension: No Has patient had a PCN reaction causing severe rash involving mucus membranes or skin necrosis: No Has patient had a PCN reaction that required hospitalization No Has patient had a PCN reaction occurring within the last 10 years: No If all of the above answers are "NO", then may proceed with Cephalosporin use.      Medication List    STOP taking these medications   amLODipine 10 MG  tablet Commonly known as:  NORVASC   benzonatate 100 MG capsule Commonly known as:  TESSALON PERLES   butalbital-acetaminophen-caffeine 50-325-40 MG tablet Commonly known as:  FIORICET, ESGIC   fexofenadine 60 MG tablet Commonly known as:  ALLEGRA   HUMALOG KWIKPEN 100 UNIT/ML KwikPen Generic drug:  insulin lispro   insulin glargine 100 UNIT/ML injection Commonly known as:  LANTUS   insulin NPH Human 100 UNIT/ML injection Commonly known as:  HUMULIN N,NOVOLIN N   insulin regular 100 units/mL injection Commonly known as:  HUMULIN R   ondansetron 4 MG disintegrating tablet Commonly known as:  ZOFRAN ODT   sertraline 50 MG tablet Commonly known as:  ZOLOFT     TAKE these medications  ARIPiprazole 10 MG tablet Commonly known as:  ABILIFY Take 10 mg by mouth daily.   escitalopram 20 MG tablet Commonly known as:  LEXAPRO Take 20 mg by mouth daily.   hydrOXYzine 10 MG tablet Commonly known as:  ATARAX/VISTARIL Take 20 mg by mouth daily.   insulin degludec 100 UNIT/ML Sopn FlexTouch Pen Commonly known as:  TRESIBA Inject 24 Units into the skin daily.   insulin detemir 100 UNIT/ML injection Commonly known as:  LEVEMIR Inject 36 Units into the skin daily.   INSULIN SYRINGE .3CC/31GX5/16" 31G X 5/16" 0.3 ML Misc 5 times daily as directed.   lisinopril 40 MG tablet Commonly known as:  PRINIVIL,ZESTRIL Take 1 tablet (40 mg total) by mouth daily.   naproxen 500 MG tablet Commonly known as:  NAPROSYN Take 500 mg by mouth 2 (two) times daily as needed.   NOVOLOG FLEXPEN [place] Inject into the skin.   pantoprazole 40 MG tablet Commonly known as:  PROTONIX Take 40 mg by mouth daily.   PEG 3350 Powd Take 17 g by mouth 2 (two) times daily as needed.   traMADol 50 MG tablet Commonly known as:  ULTRAM Take 1 tablet (50 mg total) by mouth every 6 (six) hours as needed for moderate pain. What changed:    when to take this  reasons to take this           Total Time in preparing paper work, data evaluation and todays exam - 35 minutes  Auburn Bilberry M.D on 11/02/2018 at 5:07 PM [facility]   Office  [phone]

## 2018-11-02 NOTE — Care Management Note (Signed)
Case Management Note  Patient Details  Name: Jody Chavez MRN: [id number] Date of Birth: 1981-09-18  Subjective/Objective:    Patient admitted for DKA- patient reports that she has been a type 1 diabetic since she was 38 years old.  She reports that for the past 2 years she has not had any problems with her diabetes but recently she was placed on some new medication for depression and it may have contributed to her blood sugar problems.  Patient reports that she checks her blood sugars 4-5 times per day and takes her insulin as prescribed.  Patient is insured and works for [employer] in [place].  Patient lives in [place] with her 54 year old son, she drives and has family and friend support in the community.  Patient is independent and reports that she has been exercising and lost 17 pounds, she goes to [employer].  PCP is [facility].  No discharge needs identified.  RNCM signed off. Jody Lis RN BSN  [phone]                 Action/Plan:   Expected Discharge Date:                  Expected Discharge Plan:  Home/Self Care  In-House Referral:     Discharge planning Services  CM Consult  Post Acute Care Choice:    Choice offered to:     DME Arranged:    DME Agency:     HH Arranged:    HH Agency:     Status of Service:  Completed, signed off  If discussed at Long Length of Stay Meetings, dates discussed:    Additional Comments:  Jody Butcher, RN 11/02/2018, 2:21 PM

## 2018-11-02 NOTE — Progress Notes (Addendum)
[facility] - [place] at [facility] was admitted to the Hospital on 11/01/2018 and Discharged  11/02/2018 and should be excused from work/school   For 5 days starting 11/01/2018 , may return to work/school without any restrictions.  Call Auburn Bilberry MD with questions.  Auburn Bilberry M.D on 11/02/2018,at 4:08 PM  [facility] - [place] at [facility]  [phone]

## 2018-11-02 NOTE — Consult Note (Signed)
Name: Jody Chavez MRN: [id number] DOB: 1981/04/02    ADMISSION DATE:  11/01/2018 CONSULTATION DATE:  11/01/18  REFERRING MD :  Dr. Elpidio AnisSudini  CHIEF COMPLAINT:  Fatigue, Hyperglycemia  BRIEF PATIENT DESCRIPTION:  38 y.o Female admitted with DKA, Hyperkalemia, and AKI.  SIGNIFICANT EVENTS  11/01/18>> [facility]  STUDIES:   CULTURES:  ANTIBIOTICS:  HISTORY OF PRESENT ILLNESS:   Jody Chavez is a 38 y.o. Female with a PMH as listed below who [facility] ED on 11/01/18 with c/o Fatigue and elevated blood sugars.  She also reports nausea, vomiting, and mild abdominal pain.  She reports that she has been compliant with her home Levemir 35 units along with Novolog 12 units pre-meal insulin.  [facility] for fatigue and was found to be hypoglycemic (CBG 50), of which she was treated and discharged home. Today, initial workup in the ED revealed glucose 677, Serum CO2 12, Anion gap 25, potassium 5.2, sodium 132, and Creatinine 1.45.  Venous blood gas with pH 7.18 / CO2 33 / Bicarb 12.3.  Serum Beta-Hydroxybutyric acid was 7.43.  CXR and urinalysis are both negative for infectious etiology.  [facility] for treatment of DKA, Hyperkalemia, and AKI.  PCCM is consulted for further management.  PAST MEDICAL HISTORY :   has a past medical history of Anxiety, Depressed, Diabetes mellitus without complication (HCC), Hypertension, IBS (irritable bowel syndrome), Paroxysmal SVT (supraventricular tachycardia) (HCC) (WPW), and Wolff-Parkinson-White (WPW) syndrome.  has a past surgical history that includes Cholecystectomy; HEART ABLATION; Abdominal surgery; Cardiac electrophysiology mapping and ablation; Endometrial ablation; Cesarean section; and Diagnostic laparoscopy with removal of ectopic pregnancy (Right, 03/04/2016). Prior to Admission medications   Medication Sig Start Date End Date Taking? Authorizing Provider  amLODipine (NORVASC) 10 MG tablet  Take 10 mg by mouth daily.   Yes [provider]  ARIPiprazole (ABILIFY) 10 MG tablet Take 10 mg by mouth daily.   Yes [provider]  escitalopram (LEXAPRO) 20 MG tablet Take 20 mg by mouth daily.   Yes [provider]  Insulin Aspart (NOVOLOG FLEXPEN Achille) Inject into the skin.   Yes [provider]  insulin detemir (LEVEMIR) 100 UNIT/ML injection Inject 36 Units into the skin daily.   Yes [provider]  insulin lispro (HUMALOG KWIKPEN) 100 UNIT/ML KwikPen Inject 3-10 Units into the skin 4 (four) times daily. 08/01/16  Yes [provider]  lisinopril (PRINIVIL,ZESTRIL) 40 MG tablet Take 40 mg by mouth daily.    Yes [provider]  ondansetron (ZOFRAN ODT) 4 MG disintegrating tablet Take 1 tablet (4 mg total) by mouth every 8 (eight) hours as needed for nausea or vomiting. 04/04/17  Yes Minna AntisPaduchowski, Kevin, MD  pantoprazole (PROTONIX) 40 MG tablet Take 40 mg by mouth daily. 02/13/18 02/13/19 Yes [provider]  Polyethylene Glycol 3350 (PEG 3350) POWD Take 17 g by mouth 2 (two) times daily as needed. 02/13/18  Yes [provider]  benzonatate (TESSALON PERLES) 100 MG capsule Take 2 capsules (200 mg total) by mouth 3 (three) times daily as needed. Patient not taking: Reported on 11/01/2018 07/25/18 07/25/19  Joni ReiningSmith, Ronald K, PA-C  butalbital-acetaminophen-caffeine (FIORICET, ESGIC) [id number] MG tablet Take 1-2 tablets by mouth every 6 (six) hours as needed for headache. Patient not taking: Reported on 11/01/2018 11/21/17 11/21/18  Rebecka ApleyWebster, Allison P, MD  fexofenadine (ALLEGRA) 60 MG tablet Take 1 tablet (60 mg total) by mouth 2 (two) times daily.  Patient not taking: Reported on 11/01/2018 07/25/18   Joni Reining, PA-C  hydrOXYzine (ATARAX/VISTARIL) 10 MG tablet Take 20 mg by mouth daily.    [provider]  insulin degludec (TRESIBA) 100 UNIT/ML SOPN FlexTouch Pen Inject 24 Units into the skin daily. 08/01/16   [provider]  insulin glargine (LANTUS) 100 UNIT/ML injection Inject 13 Units into the skin 2 (two) times daily.    [provider]  insulin NPH Human (HUMULIN N,NOVOLIN N) 100 UNIT/ML injection Inject 0.13 mLs (13 Units total) into the skin at bedtime. Patient not taking: Reported on 05/09/2017 11/04/16   Houston Siren, MD  insulin regular (HUMULIN R) 250 units/2.30mL (100 units/mL) injection Inject 0.01-0.1 mLs (1-10 Units total) into the skin 3 (three) times daily before meals. 1 units per 10 g of Carb. Patient not taking: Reported on 05/09/2017 11/04/16   Houston Siren, MD  Insulin Syringe-Needle U-100 (INSULIN SYRINGE .3CC/31GX5/16") 31G X 5/16" 0.3 ML MISC 5 times daily as directed. 11/04/16   Houston Siren, MD  naproxen (NAPROSYN) 500 MG tablet Take 500 mg by mouth 2 (two) times daily as needed.    [provider]  sertraline (ZOLOFT) 50 MG tablet Take 50 mg by mouth at bedtime.     [provider]  traMADol (ULTRAM) 50 MG tablet Take 1 tablet (50 mg total) by mouth every 12 (twelve) hours as needed. Patient not taking: Reported on 11/01/2018 07/25/18   Joni Reining, PA-C   Allergies  Allergen Reactions  . Iodine Anaphylaxis  . Shellfish Allergy Anaphylaxis  . Penicillins Hives and Other (See Comments)    Has patient had a PCN reaction causing immediate rash, facial/tongue/throat swelling, SOB or lightheadedness with hypotension: No Has patient had a PCN reaction causing severe rash involving mucus membranes or skin necrosis: No Has patient had a PCN reaction that required hospitalization No Has patient had a PCN reaction occurring within the last 10 years: No If all of the above answers are "NO", then may proceed with Cephalosporin use.    FAMILY HISTORY:  family history includes Gallbladder disease in her mother. SOCIAL HISTORY:  reports that she has quit smoking. She has never used smokeless tobacco. She reports current alcohol use. She reports that  she does not use drugs.  REVIEW OF SYSTEMS:  Positives in BOLD Constitutional: Negative for fever, chills, weight loss, +malaise/fatigue and diaphoresis.  HENT: Negative for hearing loss, ear pain, nosebleeds, congestion, +sore throat, neck pain, tinnitus and ear discharge.   Eyes: Negative for blurred vision, double vision, photophobia, pain, discharge and redness.  Respiratory: Negative for cough, hemoptysis, sputum production, shortness of breath, wheezing and stridor.   Cardiovascular: Negative for chest pain, palpitations, orthopnea, claudication, leg swelling and PND.  Gastrointestinal: Negative for heartburn, nausea, vomiting, abdominal pain, diarrhea, constipation, blood in stool and melena.  Genitourinary: Negative for dysuria, urgency, frequency, hematuria and flank pain.  Musculoskeletal: Negative for myalgias, back pain, joint pain and falls.  Skin: Negative for itching and rash.  Neurological: Negative for dizziness, tingling, tremors, sensory change, speech change, focal weakness, seizures, loss of consciousness, weakness and headaches.  Endo/Heme/Allergies: Negative for environmental allergies and polydipsia. Does not bruise/bleed easily.  SUBJECTIVE:  Pt reports she is feeling much better, does complain of sore throat from previous vomiting Denies chest pain, shortness of breath, fever, chills, swelling Reports her nausea has subsided Wants to eat On room air  VITAL SIGNS: Temp:  [98.3 F (36.8 C)-99.2 F (37.3  C)] 99.2 F (37.3 C) (01/02 2129) Pulse Rate:  [123-133] 133 (01/02 2200) Resp:  [16-24] 16 (01/02 2200) BP: (134-169)/(71-128) 136/99 (01/02 2200) SpO2:  [98 %-100 %] 98 % (01/02 2200) Weight:  [78 kg] 78 kg (01/02 1855)  PHYSICAL EXAMINATION: General:  Acutely ill appearing female, laying in bed, on room air, in NAD Neuro:  Awake, alert and oriented, follows commands, no focal deficits, speech clear HEENT:  Atraumatic, normocephalic, neck supple, no  JVD Cardiovascular:  Tachycardia, regular rhythm, s1s2, no M/R/G, 2+ pulses throughout Lungs:  Clear bilaterally to auscultation, even, nonlabored, normal effort Abdomen:  Soft, nontender, nondistended, no rebound tenderness or guarding, BS+ x4 Musculoskeletal:  No deformities, normal bulk and tone, no edema Skin:  Warm/dry.  No obvious rashes, lesions, or ulcerations  Recent Labs  Lab 10/31/18 0927 11/01/18 1856 11/01/18 1917 11/01/18 2217  NA 138 132* 130* 140  K 3.4* 5.2* 5.2* 5.0  CL 102 95* 101 107  CO2 27 12*  --  15*  BUN 18 25* 22* 23*  CREATININE 0.90 1.45* 1.10* 1.46*  GLUCOSE 50* 677* 679* 320*   Recent Labs  Lab 10/31/18 0927 11/01/18 1856 11/01/18 1917  HGB 12.2 14.2 16.7*  HCT 36.9 45.3 49.0*  WBC 8.3 16.8*  --   PLT 263 301  --    Dg Chest 2 View  Result Date: 10/31/2018 CLINICAL DATA:  Chest pain. EXAM: CHEST - 2 VIEW COMPARISON:  11/21/2017 FINDINGS: The heart size and mediastinal contours are within normal limits. Both lungs are clear. The visualized skeletal structures are unremarkable. IMPRESSION: Normal exam. Electronically Signed   By: Francene Boyers M.D.   On: 10/31/2018 09:30    ASSESSMENT / PLAN:  DKA -DKA protocol -Aggressive IVF -Insulin drip -BMP q4h -Once Anion gap and CO2 normalized, plan to convert her back to her home regimen -Consult diabetes coordinator  Mild Isotonic Hyponatremia (Pseudohyponatremia) in setting of severe hyperglycemia -Follow BMP  AKI Mild Hyperkalemia -Monitor I&O's / urinary output -Follow BMP -Ensure adequate renal perfusion -Avoid nephrotoxic agents as able -Replace electrolytes as indicated -Aggressive IVF -As acidosis resolves, along with insulin drip, expect potassium to normalize  Leukocytosis -No obvious source of infection, CXR and Urinalysis negative -Monitor fever curve -Follow WBC's  DISPOSTION: Stepdown GOALS OF CARE: Full code VTE PROPHYLAXIS: Lovenox Updates: Updated pt at bedside  11/02/17   Harlon Ditty, [facility] Pager: [phone] Cell: [phone]  11/02/2018, 2:13 AM

## 2018-11-02 NOTE — Progress Notes (Signed)
Agnes Lawrence to be D/C'd Home per MD. Discussed with the patient and all questions fully answered.   VSS, skin clean, dry and intact without evidence of skin break down, no evidence of skin tears noted.  IV catheters discontinued intact. Site without signs and symptoms of complications. Dressing and pressure applied.   An after visit summary was printed and given to the patient. Patient received prescriptions.   D/c education completed with patient/family including follow up instructions, medications list, d/c activities limitations if indicated, with other d/c instructions as indicated by MD- patient able to verbalize understanding, all questions fully answered.   Patient instructed to return to ED, call 911, or Call MD for any changes in condition.  Patient escorted via WC and D/C home via private auto at [phone]

## 2018-11-02 NOTE — Discharge Instructions (Signed)
 Preventing Diabetic Ketoacidosis Diabetic ketoacidosis (DKA) is a life-threatening complication of diabetes (diabetes mellitus). It develops when there is not enough of a hormone called insulin in the body. If the body does not have enough insulin, it cannot divide (break down) sugar (glucose) into usable cells, so it breaks down fats instead. This leads to the production of acids (ketones), which can cause the blood to have too much acid in it (acidosis). DKA is a medical emergency that must be treated at the hospital. You may be more likely to develop DKA if you have type 1 diabetes and you take insulin. You can prevent DKA by working closely with your health care provider to manage your diabetes. What nutrition changes can be made?   Follow your meal plan, as directed by your health care provider or diet and nutrition specialist (dietitian).  Eat healthy meals at about the same time every day. Have healthy snacks between meals.  Avoid not eating for long periods of time. Do not skip meals, especially if you are ill.  Avoid regularly eating foods that contain a lot of sugar. Also avoid drinking alcohol. Sugary food and alcohol increase your risk of high blood glucose (hyperglycemia), which increases your risk for DKA.  Drink enough fluid to keep your urine pale yellow. Dehydration increases your risk for DKA. What actions can I take to lower my risk? To lower your risk for diabetic ketoacidosis, manage your diabetes as directed by your health care provider:  Take insulin and other diabetes medicines as directed.  Check your blood glucose every day, as often as directed.  Follow your sick day plan whenever you cannot eat or drink as usual. Make this plan in advance with your health care provider.  Check your urine for ketones as often as directed. ? During times when you are sick, check your ketones every 4-6 hours. ? If you develop symptoms of DKA, check your ketones right away.  If  you have ketones in your urine: ? Contact your health care provider right away. ? Do not exercise.  Know the symptoms of DKA so that you can get treatment as soon as possible.  Make sure that people at work, home, and school know how to check your blood glucose, in case you are not able to do it yourself.  Carry a medical alert card or wear medical alert jewelry that says that you have diabetes. Why are these changes important? DKA is a warning sign that your diabetes is not being well-controlled. You may need to work with your health care provider to adjust your diabetes management plan. DKA can lead to a serious medical emergency that can be life-threatening. Where to find support For more support with preventing DKA:  Talk with your health care provider.  Consider joining a support group. The American Diabetes Association has an online support community at: community.diabetes.org/home Where to find more information Learn more about preventing DKA from:  American Diabetes Association: www.diabetes.org  American Heart Association: www.heart.org Contact a health care provider if: You develop symptoms of DKA, such as:  Fatigue.  Weight loss.  Excessive thirst.  Light-headedness.  Fruity or sweet-smelling breath.  Excessive urination.  Vision changes.  Confusion or irritability.  Nausea.  Vomiting.  Rapid breathing.  Pain in the abdomen.  Feeling warm in your face (flushed). This may or may not include a reddish color coming to your face. If you develop any of these symptoms, do not wait to see if the symptoms   go away. Get medical help right away. Call your local emergency services (911 in the [place] Do not drive yourself to the hospital. Summary  DKA may be a warning sign that your diabetes is not being well-controlled. You may need to work with your health care provider to adjust your diabetes management plan.  Preventing high blood glucose and dehydration  helps prevent DKA.  Check your urine for ketones as often as directed. You may need to check more often when your blood glucose level is high and when you are ill.  DKA is a medical emergency. Make sure you know the symptoms so that you can recognize and get treatment right away. This information is not intended to replace advice given to you by your health care provider. Make sure you discuss any questions you have with your health care provider. Document Released: 05/18/2017 Document Revised: 05/18/2017 Document Reviewed: 05/18/2017 Elsevier Interactive Patient Education  2019 [employer]   Diabetes Mellitus and Nutrition, Adult When you have diabetes (diabetes mellitus), it is very important to have healthy eating habits because your blood sugar (glucose) levels are greatly affected by what you eat and drink. Eating healthy foods in the appropriate amounts, at about the same times every day, can help you:  Control your blood glucose.  Lower your risk of heart disease.  Improve your blood pressure.  Reach or maintain a healthy weight. Every person with diabetes is different, and each person has different needs for a meal plan. Your health care provider may recommend that you work with a diet and nutrition specialist (dietitian) to make a meal plan that is best for you. Your meal plan may vary depending on factors such as:  The calories you need.  The medicines you take.  Your weight.  Your blood glucose, blood pressure, and cholesterol levels.  Your activity level.  Other health conditions you have, such as heart or kidney disease. How do carbohydrates affect me? Carbohydrates, also called carbs, affect your blood glucose level more than any other type of food. Eating carbs naturally raises the amount of glucose in your blood. Carb counting is a method for keeping track of how many carbs you eat. Counting carbs is important to keep your blood glucose at a healthy level,  especially if you use insulin or take certain oral diabetes medicines. It is important to know how many carbs you can safely have in each meal. This is different for every person. Your dietitian can help you calculate how many carbs you should have at each meal and for each snack. Foods that contain carbs include:  Bread, cereal, rice, pasta, and crackers.  Potatoes and corn.  Peas, beans, and lentils.  Milk and yogurt.  Fruit and juice.  Desserts, such as cakes, cookies, ice cream, and candy. How does alcohol affect me? Alcohol can cause a sudden decrease in blood glucose (hypoglycemia), especially if you use insulin or take certain oral diabetes medicines. Hypoglycemia can be a life-threatening condition. Symptoms of hypoglycemia (sleepiness, dizziness, and confusion) are similar to symptoms of having too much alcohol. If your health care provider says that alcohol is safe for you, follow these guidelines:  Limit alcohol intake to no more than 1 drink per day for nonpregnant women and 2 drinks per day for men. One drink equals 12 oz of beer, 5 oz of wine, or 1 oz of hard liquor.  Do not drink on an empty stomach.  Keep yourself hydrated with water, diet soda, or unsweetened  iced tea.  Keep in mind that regular soda, juice, and other mixers may contain a lot of sugar and must be counted as carbs. What are tips for following this plan?  Reading food labels  Start by checking the serving size on the "Nutrition Facts" label of packaged foods and drinks. The amount of calories, carbs, fats, and other nutrients listed on the label is based on one serving of the item. Many items contain more than one serving per package.  Check the total grams (g) of carbs in one serving. You can calculate the number of servings of carbs in one serving by dividing the total carbs by 15. For example, if a food has 30 g of total carbs, it would be equal to 2 servings of carbs.  Check the number of grams  (g) of saturated and trans fats in one serving. Choose foods that have low or no amount of these fats.  Check the number of milligrams (mg) of salt (sodium) in one serving. Most people should limit total sodium intake to less than 2,300 mg per day.  Always check the nutrition information of foods labeled as "low-fat" or "nonfat". These foods may be higher in added sugar or refined carbs and should be avoided.  Talk to your dietitian to identify your daily goals for nutrients listed on the label. Shopping  Avoid buying canned, premade, or processed foods. These foods tend to be high in fat, sodium, and added sugar.  Shop around the outside edge of the grocery store. This includes fresh fruits and vegetables, bulk grains, fresh meats, and fresh dairy. Cooking  Use low-heat cooking methods, such as baking, instead of high-heat cooking methods like deep frying.  Cook using healthy oils, such as olive, canola, or sunflower oil.  Avoid cooking with butter, cream, or high-fat meats. Meal planning  Eat meals and snacks regularly, preferably at the same times every day. Avoid going long periods of time without eating.  Eat foods high in fiber, such as fresh fruits, vegetables, beans, and whole grains. Talk to your dietitian about how many servings of carbs you can eat at each meal.  Eat 4-6 ounces (oz) of lean protein each day, such as lean meat, chicken, fish, eggs, or tofu. One oz of lean protein is equal to: ? 1 oz of meat, chicken, or fish. ? 1 egg. ?  cup of tofu.  Eat some foods each day that contain healthy fats, such as avocado, nuts, seeds, and fish. Lifestyle  Check your blood glucose regularly.  Exercise regularly as told by your health care provider. This may include: ? 150 minutes of moderate-intensity or vigorous-intensity exercise each week. This could be brisk walking, biking, or water aerobics. ? Stretching and doing strength exercises, such as yoga or weightlifting, at  least 2 times a week.  Take medicines as told by your health care provider.  Do not use any products that contain nicotine or tobacco, such as cigarettes and e-cigarettes. If you need help quitting, ask your health care provider.  Work with a Veterinary surgeon or diabetes educator to identify strategies to manage stress and any emotional and social challenges. Questions to ask a health care provider  Do I need to meet with a diabetes educator?  Do I need to meet with a dietitian?  What number can I call if I have questions?  When are the best times to check my blood glucose? Where to find more information:  American Diabetes Association: diabetes.org  Academy of  Nutrition and Dietetics: www.eatright.[employer] of Diabetes and Digestive and Kidney Diseases (NIH): CarFlippers.tn Summary  A healthy meal plan will help you control your blood glucose and maintain a healthy lifestyle.  Working with a diet and nutrition specialist (dietitian) can help you make a meal plan that is best for you.  Keep in mind that carbohydrates (carbs) and alcohol have immediate effects on your blood glucose levels. It is important to count carbs and to use alcohol carefully. This information is not intended to replace advice given to you by your health care provider. Make sure you discuss any questions you have with your health care provider. Document Released: 07/14/2005 Document Revised: 05/17/2017 Document Reviewed: 11/21/2016 Elsevier Interactive Patient Education  2019 [employer].

## 2018-11-03 LAB — HIV ANTIBODY (ROUTINE TESTING W REFLEX): HIV Screen 4th Generation wRfx: NONREACTIVE

## 2018-11-24 ENCOUNTER — Other Ambulatory Visit: Payer: Self-pay

## 2018-11-24 ENCOUNTER — Ambulatory Visit
Admission: EM | Admit: 2018-11-24 | Discharge: 2018-11-24 | Disposition: A | Discharge status: Home / Self Care | Payer: BC Managed Care – PPO | Attending: Emergency Medicine | Admitting: Emergency Medicine

## 2018-11-24 ENCOUNTER — Encounter: Payer: Self-pay | Admitting: Gynecology

## 2018-11-24 DIAGNOSIS — J029 Acute pharyngitis, unspecified: Secondary | ICD-10-CM | POA: Diagnosis not present

## 2018-11-24 DIAGNOSIS — E119 Type 2 diabetes mellitus without complications: Secondary | ICD-10-CM

## 2018-11-24 DIAGNOSIS — R05 Cough: Secondary | ICD-10-CM

## 2018-11-24 DIAGNOSIS — Z87891 Personal history of nicotine dependence: Secondary | ICD-10-CM

## 2018-11-24 DIAGNOSIS — L089 Local infection of the skin and subcutaneous tissue, unspecified: Secondary | ICD-10-CM | POA: Insufficient documentation

## 2018-11-24 DIAGNOSIS — R51 Headache: Secondary | ICD-10-CM | POA: Diagnosis not present

## 2018-11-24 DIAGNOSIS — R6883 Chills (without fever): Secondary | ICD-10-CM

## 2018-11-24 DIAGNOSIS — R0981 Nasal congestion: Secondary | ICD-10-CM

## 2018-11-24 DIAGNOSIS — J111 Influenza due to unidentified influenza virus with other respiratory manifestations: Secondary | ICD-10-CM | POA: Diagnosis not present

## 2018-11-24 LAB — GLUCOSE, CAPILLARY: GLUCOSE-CAPILLARY: 67 mg/dL — AB (ref 70–99)

## 2018-11-24 LAB — RAPID STREP SCREEN (MED CTR MEBANE ONLY): Streptococcus, Group A Screen (Direct): NEGATIVE

## 2018-11-24 LAB — RAPID INFLUENZA A&B ANTIGENS
Influenza A (ARMC): NEGATIVE
Influenza B (ARMC): NEGATIVE

## 2018-11-24 MED ORDER — DOXYCYCLINE HYCLATE 100 MG PO CAPS: 100.0000 mg | ORAL_CAPSULE | Freq: Two times a day (BID) | ORAL | 0 refills | Status: AC

## 2018-11-24 MED ORDER — IBUPROFEN 600 MG PO TABS: 600.0000 mg | ORAL_TABLET | Freq: Four times a day (QID) | ORAL | 0 refills | Status: DC | PRN

## 2018-11-24 MED ORDER — OSELTAMIVIR PHOSPHATE 75 MG PO CAPS: 75.0000 mg | ORAL_CAPSULE | Freq: Two times a day (BID) | ORAL | 0 refills | Status: DC

## 2018-11-24 MED ORDER — CHLORHEXIDINE GLUCONATE 4 % EX LIQD: Freq: Every day | CUTANEOUS | 0 refills | Status: DC | PRN

## 2018-11-24 MED ORDER — FLUTICASONE PROPIONATE 50 MCG/ACT NA SUSP: 2.0000 | Freq: Every day | NASAL | 0 refills | Status: DC

## 2018-11-24 NOTE — ED Provider Notes (Signed)
HPI  SUBJECTIVE:  Jody Chavez is a 38 y.o. female who presents with the acute onset of nasal congestion, rhinorrhea, postnasal drip, sore throat, nonproductive cough, frontal headache, body aches starting 2 days ago.  She works at a hospital.  No sinus pain or pressure, wheezing, shortness of breath, dyspnea on exertion.  She reports chest soreness secondary to the cough.  No fevers above 100.4.  No rash, abdominal pain, vomiting, diarrhea, neck stiffness or photophobia.  She reports decreased p.o. intake secondary to her sore throat.  She tried BC powders, Mucinex, diabetic Tussin without improvement in her symptoms.  Symptoms are worse with swallowing.  She took a BC powder within 4 to 6 hours of evaluation.  No contacts with strep or mono.  She also reports 2 weeks of recurrent painful "bumps" in her axilla and groin where she shaves.  States that it occasionally drains "white stuff".  She has tried warm compresses with improvement in her symptoms.  Symptoms are worse with friction. Past medical history of insulin-dependent diabetes, hypertension, SVT, WPW, recent admission for DKA.  No history of recurrent strep, mono, MRSA.  LMP: Now.  Denies the possibility of being pregnant.  ZGY:FVCBSW, [employer]    Past Medical History:  Diagnosis Date  . Anxiety   . Depressed   . Diabetes mellitus without complication (HCC)   . Hypertension   . IBS (irritable bowel syndrome)   . Paroxysmal SVT (supraventricular tachycardia) (HCC) WPW  . Wolff-Parkinson-White (WPW) syndrome     Past Surgical History:  Procedure Laterality Date  . ABDOMINAL SURGERY    . CARDIAC ELECTROPHYSIOLOGY MAPPING AND ABLATION    . CESAREAN SECTION    . CHOLECYSTECTOMY    . DIAGNOSTIC LAPAROSCOPY WITH REMOVAL OF ECTOPIC PREGNANCY Right 03/04/2016   Procedure: DIAGNOSTIC LAPAROSCOPY WITH right salpingectomy and REMOVAL OF ECTOPIC PREGNANCY, extensive lysis of adhesions;  Surgeon: Suzy Bouchard, MD;   Location: [facility] ORS;  Service: Gynecology;  Laterality: Right;  . ENDOMETRIAL ABLATION    . HEART ABLATION      Family History  Problem Relation Age of Onset  . Gallbladder disease Mother     Social History   Tobacco Use  . Smoking status: Former Games developer  . Smokeless tobacco: Never Used  Substance Use Topics  . Alcohol use: Yes    Comment: socially  . Drug use: No    No current facility-administered medications for this encounter.   Current Outpatient Medications:  .  ARIPiprazole (ABILIFY) 10 MG tablet, Take 10 mg by mouth daily., Disp: , Rfl:  .  escitalopram (LEXAPRO) 20 MG tablet, Take 20 mg by mouth daily., Disp: , Rfl:  .  hydrOXYzine (ATARAX/VISTARIL) 10 MG tablet, Take 20 mg by mouth daily., Disp: , Rfl:  .  Insulin Aspart (NOVOLOG FLEXPEN [place]), Inject into the skin., Disp: , Rfl:  .  insulin degludec (TRESIBA) 100 UNIT/ML SOPN FlexTouch Pen, Inject 24 Units into the skin daily., Disp: , Rfl:  .  Insulin Syringe-Needle U-100 (INSULIN SYRINGE .3CC/31GX5/16") 31G X 5/16" 0.3 ML MISC, 5 times daily as directed., Disp: 100 each, Rfl: 1 .  lisinopril (PRINIVIL,ZESTRIL) 40 MG tablet, Take 1 tablet (40 mg total) by mouth daily., Disp: 30 tablet, Rfl: 0 .  pantoprazole (PROTONIX) 40 MG tablet, Take 40 mg by mouth daily., Disp: , Rfl:  .  Polyethylene Glycol 3350 (PEG 3350) POWD, Take 17 g by mouth 2 (two) times daily as needed., Disp: , Rfl:  .  traMADol (ULTRAM)  50 MG tablet, Take 1 tablet (50 mg total) by mouth every 6 (six) hours as needed for moderate pain., Disp: 30 tablet, Rfl: 0 .  chlorhexidine (HIBICLENS) 4 % external liquid, Apply topically daily as needed. Dilute 10-15 mL in water, Use daily when bathing for 1-2 weeks, Disp: 120 mL, Rfl: 0 .  doxycycline (VIBRAMYCIN) 100 MG capsule, Take 1 capsule (100 mg total) by mouth 2 (two) times daily for 7 days., Disp: 14 capsule, Rfl: 0 .  fluticasone (FLONASE) 50 MCG/ACT nasal spray, Place 2 sprays into both nostrils daily., Disp:  16 g, Rfl: 0 .  ibuprofen (ADVIL,MOTRIN) 600 MG tablet, Take 1 tablet (600 mg total) by mouth every 6 (six) hours as needed., Disp: 30 tablet, Rfl: 0 .  insulin detemir (LEVEMIR) 100 UNIT/ML injection, Inject 36 Units into the skin daily., Disp: , Rfl:  .  oseltamivir (TAMIFLU) 75 MG capsule, Take 1 capsule (75 mg total) by mouth 2 (two) times daily. X 5 days, Disp: 10 capsule, Rfl: 0  Allergies  Allergen Reactions  . Iodine Anaphylaxis  . Shellfish Allergy Anaphylaxis  . Penicillins Hives and Other (See Comments)    Has patient had a PCN reaction causing immediate rash, facial/tongue/throat swelling, SOB or lightheadedness with hypotension: No Has patient had a PCN reaction causing severe rash involving mucus membranes or skin necrosis: No Has patient had a PCN reaction that required hospitalization No Has patient had a PCN reaction occurring within the last 10 years: No If all of the above answers are "NO", then may proceed with Cephalosporin use.     ROS  As noted in HPI.   Physical Exam  BP (!) 137/103 (BP Location: Left Arm)   Pulse (!) 113   Temp 99.9 F (37.7 C) (Oral)   Resp 16   Ht 5\' 3"  (1.6 m)   Wt 77.1 kg   LMP 11/24/2018   SpO2 98%   BMI 30.11 kg/m   Constitutional: Well developed, well nourished, no acute distress Eyes: PERRL, EOMI, conjunctiva normal bilaterally HENT: Normocephalic, atraumatic,mucus membranes moist.  Positive erythematous, swollen turbinates with clear nasal congestion.  No sinus tenderness.  Normal tonsils, uvula midline.  Normal oropharynx. Neck: Positive shotty cervical lymphadenopathy.  No meningismus. Respiratory: Clear to auscultation bilaterally, no rales, no wheezing, no rhonchi Cardiovascular: Regular tachycardia, no murmurs, no gallops, no rubs GI: Soft, nondistended, normal bowel sounds, nontender, no rebound, no guarding no splenomegaly.   Back: no CVAT skin: skin intact +1.0 cm tender area of induration without erythema left  axilla, right upper thigh..  No central fluctuance or expressible purulent drainage.   Musculoskeletal: No edema, no tenderness, no deformities Neurologic: Alert & oriented x 3, CN II-XII grossly intact, no motor deficits, sensation grossly intact Psychiatric: Speech and behavior appropriate   ED Course   Medications - No data to display  Orders Placed This Encounter  Procedures  . Rapid Strep Screen ([facility] ONLY)    Standing Status:   Standing    Number of Occurrences:   1  . Rapid Influenza A&B Antigens ([facility] only)    Standing Status:   Standing    Number of Occurrences:   1  . Culture, group A strep    Standing Status:   Standing    Number of Occurrences:   1  . Glucose, capillary    Standing Status:   Standing    Number of Occurrences:   1  . Droplet precaution    Standing Status:  Standing    Number of Occurrences:   1  . CBG monitoring, ED    Standing Status:   Standing    Number of Occurrences:   1   Results for orders placed or performed during the hospital encounter of 11/24/18 (from the past 24 hour(s))  Rapid Strep Screen ([facility] ONLY)     Status: None   Collection Time: 11/24/18  8:21 AM  Result Value Ref Range   Streptococcus, Group A Screen (Direct) NEGATIVE NEGATIVE  Rapid Influenza A&B Antigens ([facility] only)     Status: None   Collection Time: 11/24/18  8:22 AM  Result Value Ref Range   Influenza A ([facility]) NEGATIVE NEGATIVE   Influenza B ([facility]) NEGATIVE NEGATIVE  Glucose, capillary     Status: Abnormal   Collection Time: 11/24/18  8:34 AM  Result Value Ref Range   Glucose-Capillary 67 (L) 70 - 99 mg/dL   No results found.  ED Clinical Impression  Influenza  Skin infection   ED Assessment/Plan  Previous records reviewed.  As noted in HPI. Checking flu, strep, CBG.  CBG 67. 1.  Flu, strep negative.  She is afebrile here, but she took BC powder within 4 to 6 hours of evaluation.  I suspect that she has the flu, as he has with  several flu contacts.  No evidence of meningitis, sinusitis, pneumonia, or abdominal process.  Will send home with Tamiflu, ibuprofen 600 mg combined with 1 g of Tylenol together 3 or 4 times a day, Benadryl/Maalox combination Flonase, work note for Monday.  She states that she has today and tomorrow off.  2.  Folliculitis versus early abscesses.  There does not appear to be anything to I&D today.  This is in the area where she shaves.  Will advise her to not shave until these resolve.  Sending home with chlorhexidine soap and a wait-and-see prescription of doxycycline continue warm compresses.  Discussed labs,  MDM, treatment plan, and plan for follow-up with patient Discussed sn/sx that should prompt return to the ED. patient agrees with plan.   Meds ordered this encounter  Medications  . ibuprofen (ADVIL,MOTRIN) 600 MG tablet    Sig: Take 1 tablet (600 mg total) by mouth every 6 (six) hours as needed.    Dispense:  30 tablet    Refill:  0  . oseltamivir (TAMIFLU) 75 MG capsule    Sig: Take 1 capsule (75 mg total) by mouth 2 (two) times daily. X 5 days    Dispense:  10 capsule    Refill:  0  . fluticasone (FLONASE) 50 MCG/ACT nasal spray    Sig: Place 2 sprays into both nostrils daily.    Dispense:  16 g    Refill:  0  . chlorhexidine (HIBICLENS) 4 % external liquid    Sig: Apply topically daily as needed. Dilute 10-15 mL in water, Use daily when bathing for 1-2 weeks    Dispense:  120 mL    Refill:  0  . doxycycline (VIBRAMYCIN) 100 MG capsule    Sig: Take 1 capsule (100 mg total) by mouth 2 (two) times daily for 7 days.    Dispense:  14 capsule    Refill:  0    *This clinic note was created using Scientist, clinical (histocompatibility and immunogenetics). Therefore, there may be occasional mistakes despite careful proofreading.  ?   Domenick Gong, MD 11/25/18 1219

## 2018-11-24 NOTE — ED Triage Notes (Signed)
Patient c/o cough / sore throat/ weakness x 2 days.

## 2018-11-24 NOTE — Discharge Instructions (Addendum)
your rapid strep was negative today, so we have sent off a throat culture.  We will contact you and call in the appropriate antibiotics if your culture comes back positive for an infection requiring antibiotic treatment.  Give us a working phone number. 1 gram of Tylenol and 600 mg ibuprofen together 3-4 times a day as needed for pain.  Make sure you drink plenty of extra fluids.  Some people find salt water gargles and  Traditional Medicinal's "Throat Coat" tea helpful. Take 5 mL of liquid Benadryl and 5 mL of Maalox. Mix it together, and then hold it in your mouth for as long as you can and then swallow. You may do this 4 times a day.  Your rapid flu was negative however I am treating as if have the flu with Tamiflu, Flonase.  Suspect that you have a skin infection.  Try the chlorhexidine soap, continue warm compresses  And see how you do.  If not getting better after you recover from this illness, go ahead and start the doxycycline.  Go to www.goodrx.com to look up your medications. This will give you a list of where you can find your prescriptions at the most affordable prices. Or ask the pharmacist what the cash price is, or if they have any other discount programs available to help make your medication more affordable. This can be less expensive than what you would pay with insurance.

## 2018-11-26 ENCOUNTER — Emergency Department: Payer: BC Managed Care – PPO

## 2018-11-26 ENCOUNTER — Emergency Department
Admission: EM | Admit: 2018-11-26 | Discharge: 2018-11-26 | Disposition: A | Discharge status: Home / Self Care | Payer: BC Managed Care – PPO | Attending: Emergency Medicine | Admitting: Emergency Medicine

## 2018-11-26 ENCOUNTER — Encounter: Payer: Self-pay | Admitting: Emergency Medicine

## 2018-11-26 ENCOUNTER — Other Ambulatory Visit: Payer: Self-pay

## 2018-11-26 DIAGNOSIS — B9789 Other viral agents as the cause of diseases classified elsewhere: Secondary | ICD-10-CM | POA: Diagnosis not present

## 2018-11-26 DIAGNOSIS — Z79899 Other long term (current) drug therapy: Secondary | ICD-10-CM | POA: Diagnosis not present

## 2018-11-26 DIAGNOSIS — J069 Acute upper respiratory infection, unspecified: Secondary | ICD-10-CM | POA: Diagnosis not present

## 2018-11-26 DIAGNOSIS — I10 Essential (primary) hypertension: Secondary | ICD-10-CM | POA: Diagnosis not present

## 2018-11-26 DIAGNOSIS — Z794 Long term (current) use of insulin: Secondary | ICD-10-CM | POA: Diagnosis not present

## 2018-11-26 DIAGNOSIS — Z87891 Personal history of nicotine dependence: Secondary | ICD-10-CM | POA: Insufficient documentation

## 2018-11-26 DIAGNOSIS — E1165 Type 2 diabetes mellitus with hyperglycemia: Secondary | ICD-10-CM | POA: Diagnosis not present

## 2018-11-26 DIAGNOSIS — R739 Hyperglycemia, unspecified: Secondary | ICD-10-CM

## 2018-11-26 DIAGNOSIS — R05 Cough: Secondary | ICD-10-CM | POA: Diagnosis present

## 2018-11-26 LAB — URINALYSIS, ROUTINE W REFLEX MICROSCOPIC
Bacteria, UA: NONE SEEN
Bilirubin Urine: NEGATIVE
Glucose, UA: >=500 mg/dL — AB
Ketones, ur: 20 mg/dL — AB
Leukocytes, UA: NEGATIVE
Nitrite: NEGATIVE
PROTEIN: NEGATIVE mg/dL
SPECIFIC GRAVITY, URINE: 1.029 (ref 1.005–1.030)
pH: 5 (ref 5.0–8.0)

## 2018-11-26 LAB — CBC WITH DIFFERENTIAL/PLATELET
Abs Immature Granulocytes: 0.03 10*3/uL (ref 0.00–0.07)
Basophils Absolute: 0 10*3/uL (ref 0.0–0.1)
Basophils Relative: 0 %
Eosinophils Absolute: 0.1 10*3/uL (ref 0.0–0.5)
Eosinophils Relative: 2 %
HCT: 40.2 % (ref 36.0–46.0)
Hemoglobin: 13.1 g/dL (ref 12.0–15.0)
Immature Granulocytes: 0 %
Lymphocytes Relative: 29 %
Lymphs Abs: 2 10*3/uL (ref 0.7–4.0)
MCH: 29.2 pg (ref 26.0–34.0)
MCHC: 32.6 g/dL (ref 30.0–36.0)
MCV: 89.7 fL (ref 80.0–100.0)
MONO ABS: 0.8 10*3/uL (ref 0.1–1.0)
Monocytes Relative: 11 %
NEUTROS ABS: 3.9 10*3/uL (ref 1.7–7.7)
Neutrophils Relative %: 58 %
Platelets: 251 10*3/uL (ref 150–400)
RBC: 4.48 MIL/uL (ref 3.87–5.11)
RDW: 14.3 % (ref 11.5–15.5)
WBC: 6.8 10*3/uL (ref 4.0–10.5)
nRBC: 0 % (ref 0.0–0.2)

## 2018-11-26 LAB — COMPREHENSIVE METABOLIC PANEL
ALT: 20 U/L (ref 0–44)
AST: 29 U/L (ref 15–41)
Albumin: 3.6 g/dL (ref 3.5–5.0)
Alkaline Phosphatase: 84 U/L (ref 38–126)
Anion gap: 11 (ref 5–15)
BUN: 21 mg/dL — ABNORMAL HIGH (ref 6–20)
CO2: 23 mmol/L (ref 22–32)
Calcium: 9.1 mg/dL (ref 8.9–10.3)
Chloride: 98 mmol/L (ref 98–111)
Creatinine, Ser: 1 mg/dL (ref 0.44–1.00)
GFR calc non Af Amer: >60 mL/min (ref >60)
Glucose, Bld: 460 mg/dL — ABNORMAL HIGH (ref 70–99)
Potassium: 4.1 mmol/L (ref 3.5–5.1)
Sodium: 132 mmol/L — ABNORMAL LOW (ref 135–145)
Total Bilirubin: 0.7 mg/dL (ref 0.3–1.2)
Total Protein: 7.5 g/dL (ref 6.5–8.1)

## 2018-11-26 LAB — GLUCOSE, CAPILLARY
GLUCOSE-CAPILLARY: 187 mg/dL — AB (ref 70–99)
Glucose-Capillary: 407 mg/dL — ABNORMAL HIGH (ref 70–99)

## 2018-11-26 MED ORDER — SODIUM CHLORIDE 0.9 % IV BOLUS
1000.0000 mL | Freq: Once | INTRAVENOUS | Status: AC
  Administered 2018-11-26: 1000 mL via INTRAVENOUS

## 2018-11-26 MED ORDER — HYDROCOD POLST-CPM POLST ER 10-8 MG/5ML PO SUER: 5.0000 mL | Freq: Two times a day (BID) | ORAL | 0 refills | Status: DC

## 2018-11-26 MED ORDER — KETOROLAC TROMETHAMINE 30 MG/ML IJ SOLN
15.0000 mg | Freq: Once | INTRAMUSCULAR | Status: AC
  Administered 2018-11-26: 15 mg via INTRAVENOUS
  Filled 2018-11-26: qty 1

## 2018-11-26 MED ORDER — MAGIC MOUTHWASH
10.0000 mL | Freq: Once | ORAL | Status: AC
  Administered 2018-11-26: 10 mL via ORAL
  Filled 2018-11-26 (×2): qty 10

## 2018-11-26 NOTE — ED Notes (Signed)
Resting quietly with eyes closed. No acute distress noted.

## 2018-11-26 NOTE — ED Triage Notes (Signed)
Pt reports 4 days of body aches, sore throat, cough, headache, sinus drainage and chest discomfort; cough is productive of beige sputum; pt seen 2 days ago at [facility] urgent care-flu negative and strep negative, but diagnosed with influenza upon discharge; pt says she's not feeling any better; prescribed tamiflu, ibuprofen and flonase; taking as directed;

## 2018-11-26 NOTE — ED Notes (Signed)
IV and medications explained to patient.  Patient resting quietly, no acute distress noted.

## 2018-11-26 NOTE — ED Notes (Signed)
Continues to rest quietly with eyes colsed.

## 2018-11-26 NOTE — ED Notes (Signed)
Pt alert and oriented X4, active, cooperative, pt in NAD. RR even and unlabored, color WNL.  Pt informed to return if any life threatening symptoms occur.  Discharge and followup instructions reviewed. Ambulates safely. Left with all of belongings. 

## 2018-11-26 NOTE — ED Provider Notes (Signed)
[facility] Emergency Department Provider Note   ____________________________________________   First MD Initiated Contact with Patient 11/26/18 [phone]     (approximate)  I have reviewed the triage vital signs and the nursing notes.   HISTORY  Chief Complaint Cough; Sore Throat; Chest Pain; Headache; and Nasal Congestion    HPI Jody Chavez is a 38 y.o. female who presents to the ED from home with a chief complaint of flulike symptoms.  Patient reports a 4-day history of body aches, sore throat, nonproductive cough, headache, sinus drainage.  [facility] and prescribed Tamiflu which she is taking.  She was also prescribed doxycycline as needed for skin abscesses but patient has not started this.  States she is not feeling better.   Notes elevated glucose.  States her baseline levels are in the 200 range.  Denies fever, chills, chest pain, shortness of breath, abdominal pain, nausea, vomiting or dysuria.  Denies recent travel or trauma.   Past Medical History:  Diagnosis Date  . Anxiety   . Depressed   . Diabetes mellitus without complication (HCC)   . Hypertension   . IBS (irritable bowel syndrome)   . Paroxysmal SVT (supraventricular tachycardia) (HCC) WPW  . Wolff-Parkinson-White (WPW) syndrome     Patient Active Problem List   Diagnosis Date Noted  . DKA (diabetic ketoacidoses) (HCC) 11/01/2018  . DKA (diabetic ketoacidosis) (HCC) 11/03/2016    Past Surgical History:  Procedure Laterality Date  . ABDOMINAL SURGERY    . CARDIAC ELECTROPHYSIOLOGY MAPPING AND ABLATION    . CESAREAN SECTION    . CHOLECYSTECTOMY    . DIAGNOSTIC LAPAROSCOPY WITH REMOVAL OF ECTOPIC PREGNANCY Right 03/04/2016   Procedure: DIAGNOSTIC LAPAROSCOPY WITH right salpingectomy and REMOVAL OF ECTOPIC PREGNANCY, extensive lysis of adhesions;  Surgeon: Suzy Bouchardhomas J Schermerhorn, MD;  Location: [facility] ORS;  Service: Gynecology;  Laterality: Right;  .  ENDOMETRIAL ABLATION    . HEART ABLATION      Prior to Admission medications   Medication Sig Start Date End Date Taking? Authorizing Provider  ARIPiprazole (ABILIFY) 10 MG tablet Take 10 mg by mouth daily.    [provider]  chlorhexidine (HIBICLENS) 4 % external liquid Apply topically daily as needed. Dilute 10-15 mL in water, Use daily when bathing for 1-2 weeks 11/24/18   Domenick GongMortenson, Ashley, MD  chlorpheniramine-HYDROcodone [facility](TUSSIONEX PENNKINETIC ER) 10-8 MG/5ML SUER Take 5 mLs by mouth 2 (two) times daily. 11/26/18   Irean HongSung,  J, MD  doxycycline (VIBRAMYCIN) 100 MG capsule Take 1 capsule (100 mg total) by mouth 2 (two) times daily for 7 days. 11/24/18 12/01/18  Domenick GongMortenson, Ashley, MD  escitalopram (LEXAPRO) 20 MG tablet Take 20 mg by mouth daily.    [provider]  fluticasone (FLONASE) 50 MCG/ACT nasal spray Place 2 sprays into both nostrils daily. 11/24/18   Domenick GongMortenson, Ashley, MD  hydrOXYzine (ATARAX/VISTARIL) 10 MG tablet Take 20 mg by mouth daily.    [provider]  ibuprofen (ADVIL,MOTRIN) 600 MG tablet Take 1 tablet (600 mg total) by mouth every 6 (six) hours as needed. 11/24/18   Domenick GongMortenson, Ashley, MD  Insulin Aspart (NOVOLOG FLEXPEN [place]) Inject into the skin.    [provider]  insulin degludec (TRESIBA) 100 UNIT/ML SOPN FlexTouch Pen Inject 24 Units into the skin daily. 08/01/16   [provider]  insulin detemir (LEVEMIR) 100 UNIT/ML injection Inject 36 Units into the skin daily.    [provider]  Insulin Syringe-Needle U-100 (  INSULIN SYRINGE .3CC/31GX5/16") 31G X 5/16" 0.3 ML MISC 5 times daily as directed. 11/04/16   Houston Siren, MD  lisinopril (PRINIVIL,ZESTRIL) 40 MG tablet Take 1 tablet (40 mg total) by mouth daily. 11/02/18   Auburn Bilberry, MD  oseltamivir (TAMIFLU) 75 MG capsule Take 1 capsule (75 mg total) by mouth 2 (two) times daily. X 5 days 11/24/18   Domenick Gong, MD  pantoprazole (PROTONIX) 40 MG tablet Take 40  mg by mouth daily. 02/13/18 02/13/19  [provider]  Polyethylene Glycol 3350 (PEG 3350) POWD Take 17 g by mouth 2 (two) times daily as needed. 02/13/18   [provider]  traMADol (ULTRAM) 50 MG tablet Take 1 tablet (50 mg total) by mouth every 6 (six) hours as needed for moderate pain. 11/02/18   Auburn Bilberry, MD    Allergies Iodine; Shellfish allergy; and Penicillins  Family History  Problem Relation Age of Onset  . Gallbladder disease Mother     Social History Social History   Tobacco Use  . Smoking status: Former Games developer  . Smokeless tobacco: Never Used  Substance Use Topics  . Alcohol use: Yes    Comment: socially  . Drug use: No    Review of Systems  Constitutional: Positive for myalgias.  No fever/chills Eyes: No visual changes. ENT: Positive for sore throat. Cardiovascular: Denies chest pain. Respiratory: Positive for cough.  Denies shortness of breath. Gastrointestinal: No abdominal pain.  No nausea, no vomiting.  No diarrhea.  No constipation. Genitourinary: Negative for dysuria. Musculoskeletal: Negative for back pain. Skin: Negative for rash. Neurological: Negative for headaches, focal weakness or numbness.   ____________________________________________   PHYSICAL EXAM:  VITAL SIGNS: ED Triage Vitals  Enc Vitals Group     BP 11/26/18 0152 (!) 141/99     Pulse Rate 11/26/18 0152 (!) 118     Resp 11/26/18 0152 18     Temp 11/26/18 0152 98.2 F (36.8 C)     Temp Source 11/26/18 0152 Oral     SpO2 11/26/18 0152 98 %     Weight 11/26/18 0152 170 lb (77.1 kg)     Height 11/26/18 0152 5\' 3"  (1.6 m)     Head Circumference --      Peak Flow --      Pain Score 11/26/18 0205 6     Pain Loc --      Pain Edu? --      Excl. in GC? --     Constitutional: Soundly asleep.  Awaken for exam.  Alert and oriented. Well appearing and in no acute distress. Eyes: Conjunctivae are normal. PERRL. EOMI. Head: Atraumatic. Nose:  Congestion/rhinnorhea. Mouth/Throat: Mucous membranes are moist.  Oropharynx mildly erythematous without tonsillar swelling, exudates or peritonsillar abscess.  There is no hoarse or muffled voice.  There is no drooling. Neck: No stridor.  Supple neck without meningismus. Cardiovascular: Normal rate, regular rhythm. Grossly normal heart sounds.  Good peripheral circulation. Respiratory: Normal respiratory effort.  No retractions. Lungs CTAB. Gastrointestinal: Soft and nontender. No distention. No abdominal bruits. No CVA tenderness. Musculoskeletal: No lower extremity tenderness nor edema.  No joint effusions. Neurologic:  Normal speech and language. No gross focal neurologic deficits are appreciated. No gait instability. Skin:  Skin is warm, dry and intact. No rash noted.  No petechiae. Psychiatric: Mood and affect are normal. Speech and behavior are normal.  ____________________________________________   LABS (all labs ordered are listed, but only abnormal results are displayed)  Labs Reviewed  GLUCOSE,  CAPILLARY - Abnormal; Notable for the following components:      Result Value   Glucose-Capillary 407 (*)    All other components within normal limits  COMPREHENSIVE METABOLIC PANEL - Abnormal; Notable for the following components:   Sodium 132 (*)    Glucose, Bld 460 (*)    BUN 21 (*)    All other components within normal limits  URINALYSIS, ROUTINE W REFLEX MICROSCOPIC - Abnormal; Notable for the following components:   Color, Urine YELLOW (*)    APPearance HAZY (*)    Glucose, UA >=500 (*)    Hgb urine dipstick MODERATE (*)    Ketones, ur 20 (*)    All other components within normal limits  GLUCOSE, CAPILLARY - Abnormal; Notable for the following components:   Glucose-Capillary 187 (*)    All other components within normal limits  CBC WITH DIFFERENTIAL/PLATELET  CBG MONITORING, ED    ____________________________________________  EKG  None ____________________________________________  RADIOLOGY  ED MD interpretation: No acute cardiopulmonary process  Official radiology report(s): Dg Chest 2 View  Result Date: 11/26/2018 CLINICAL DATA:  Chest congestion EXAM: CHEST - 2 VIEW COMPARISON:  10/31/2018 FINDINGS: The heart size and mediastinal contours are within normal limits. Both lungs are clear. The visualized skeletal structures are unremarkable. IMPRESSION: No active cardiopulmonary disease. Electronically Signed   By: Deatra RobinsonKevin  Herman M.D.   On: 11/26/2018 02:33    ____________________________________________   PROCEDURES  Procedure(s) performed: None  Procedures  Critical Care performed: No  ____________________________________________   INITIAL IMPRESSION / ASSESSMENT AND PLAN / ED COURSE  As part of my medical decision making, I reviewed the following data within the electronic MEDICAL RECORD NUMBER Nursing notes reviewed and incorporated, Labs reviewed, Old chart reviewed, Radiograph reviewed  and Notes from prior ED visits    38 year old female with diabetes who presents with flulike symptoms and hyperglycemia.  Laboratory results remarkable for hyperglycemia without elevation of anion gap.  Will initiate IV fluid hydration, administer Magic mouthwash for throat discomfort, IV Toradol for myalgias and reassess.  Clinical Course as of Nov 26 629  Mon Nov 26, 2018  [id number] Blood sugar improved.  Patient feeling better.  Strict return precautions given.  Patient verbalizes understanding and agrees with plan of care.   [JS]    Clinical Course User Index [JS] Irean HongSung,  J, MD     ____________________________________________   FINAL CLINICAL IMPRESSION(S) / ED DIAGNOSES  Final diagnoses:  Hyperglycemia  Viral URI with cough     ED Discharge Orders         Ordered    chlorpheniramine-HYDROcodone (TUSSIONEX PENNKINETIC ER) 10-8 MG/5ML SUER  2  times daily     11/26/18 [id number]           Note:  This document was prepared using Dragon voice recognition software and may include unintentional dictation errors.    Irean HongSung,  J, MD 11/26/18 [phone]

## 2018-11-26 NOTE — Discharge Instructions (Addendum)
1.  Drink plenty of fluids daily. °2.  You may take Tussionex as needed for cough. °3.  Return to the ER for worsening symptoms, persistent vomiting, difficulty breathing or other concerns. °

## 2018-11-27 LAB — CULTURE, GROUP A STREP (THRC)

## 2018-11-28 ENCOUNTER — Telehealth (HOSPITAL_COMMUNITY): Payer: Self-pay | Admitting: Emergency Medicine

## 2018-11-28 MED ORDER — AZITHROMYCIN 250 MG PO TABS: 250.0000 mg | ORAL_TABLET | Freq: Every day | ORAL | 0 refills | Status: DC

## 2018-11-28 NOTE — Telephone Encounter (Signed)
Culture is positive for non group A Strep germ.  This is a finding of uncertain significance; not the typical 'strep throat' germ.  Pt complains of persistent symptoms.  Rx for z pack no refills sent to pharmacy of patients choice. Pt verbalized understanding. Recheck for further evaluation if symptoms are not improving

## 2019-01-04 IMAGING — CR DG CHEST 2V
2 series · 2 of 2 positions shown · non-contrast
Comparison: 10/27/2016 and 02/03/2016.

CLINICAL DATA: Nausea and abdominal pain for several days.
Shortness of breath and hyperglycemia today. Diabetes.

EXAM:
CHEST  2 VIEW

[chest pa]
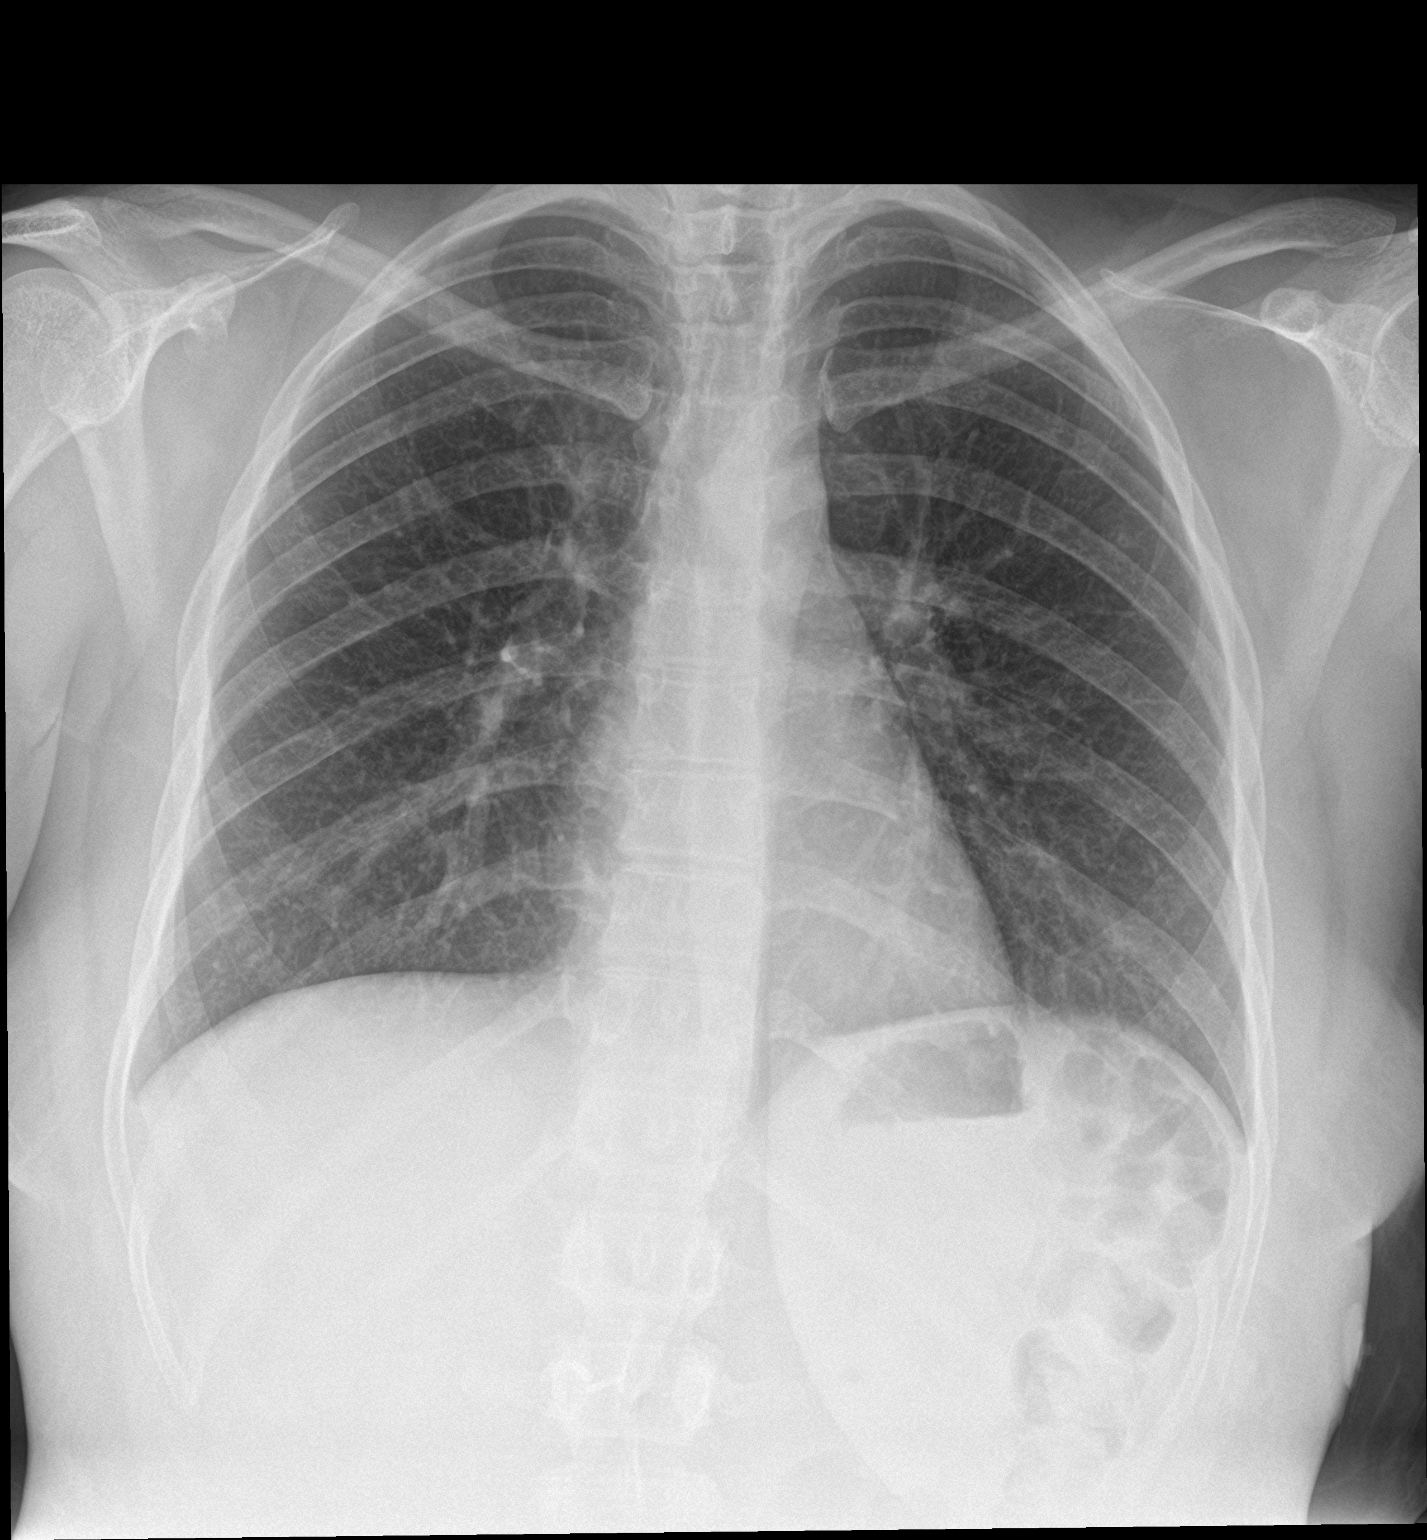

[chest lat]
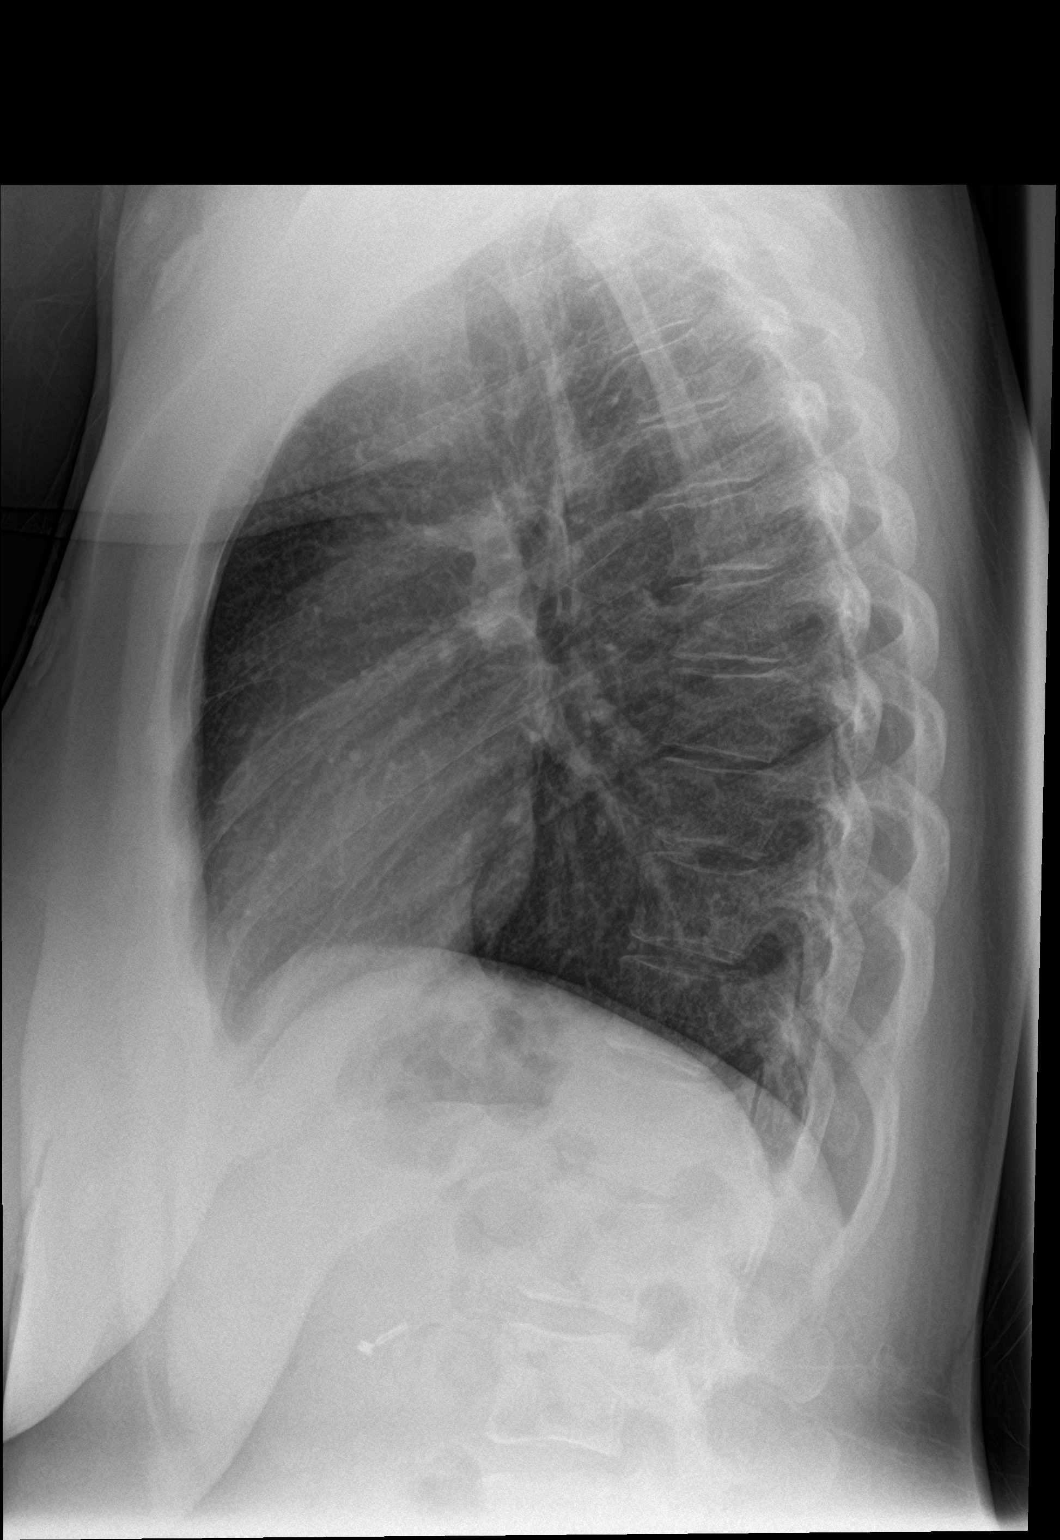

[2 of 2 positions shown; findings below may reference images not displayed]

FINDINGS: The heart size and mediastinal contours are normal. The lungs are
clear. There is no pleural effusion or pneumothorax. No acute
osseous findings are identified.
IMPRESSION: Stable chest.  No active cardiopulmonary process.

## 2019-03-29 LAB — HM HIV SCREENING LAB: HM HIV Screening: NEGATIVE

## 2019-05-15 ENCOUNTER — Other Ambulatory Visit: Payer: Self-pay

## 2019-05-15 ENCOUNTER — Encounter: Payer: Self-pay | Admitting: Emergency Medicine

## 2019-05-15 DIAGNOSIS — Z5321 Procedure and treatment not carried out due to patient leaving prior to being seen by health care provider: Secondary | ICD-10-CM | POA: Insufficient documentation

## 2019-05-15 DIAGNOSIS — R531 Weakness: Secondary | ICD-10-CM | POA: Insufficient documentation

## 2019-05-15 LAB — URINALYSIS, COMPLETE (UACMP) WITH MICROSCOPIC
RBC / HPF: >50 RBC/hpf — ABNORMAL HIGH (ref 0–5)
Specific Gravity, Urine: 1.029 (ref 1.005–1.030)
WBC, UA: >50 WBC/hpf — ABNORMAL HIGH (ref 0–5)

## 2019-05-15 LAB — CBC
HCT: 38.2 % (ref 36.0–46.0)
Hemoglobin: 12.9 g/dL (ref 12.0–15.0)
MCH: 29.8 pg (ref 26.0–34.0)
MCHC: 33.8 g/dL (ref 30.0–36.0)
MCV: 88.2 fL (ref 80.0–100.0)
Platelets: 301 10*3/uL (ref 150–400)
RBC: 4.33 MIL/uL (ref 3.87–5.11)
RDW: 14.6 % (ref 11.5–15.5)
WBC: 11.2 10*3/uL — ABNORMAL HIGH (ref 4.0–10.5)
nRBC: 0 % (ref 0.0–0.2)

## 2019-05-15 LAB — BASIC METABOLIC PANEL
Anion gap: 12 (ref 5–15)
BUN: 16 mg/dL (ref 6–20)
CO2: 23 mmol/L (ref 22–32)
Calcium: 9 mg/dL (ref 8.9–10.3)
Chloride: 102 mmol/L (ref 98–111)
Creatinine, Ser: 1.04 mg/dL — ABNORMAL HIGH (ref 0.44–1.00)
GFR calc Af Amer: >60 mL/min (ref >60)
GFR calc non Af Amer: >60 mL/min (ref >60)
Glucose, Bld: 211 mg/dL — ABNORMAL HIGH (ref 70–99)
Potassium: 3.6 mmol/L (ref 3.5–5.1)
Sodium: 137 mmol/L (ref 135–145)

## 2019-05-15 LAB — POCT PREGNANCY, URINE: Preg Test, Ur: NEGATIVE

## 2019-05-15 LAB — GLUCOSE, CAPILLARY: Glucose-Capillary: 234 mg/dL — ABNORMAL HIGH (ref 70–99)

## 2019-05-15 NOTE — ED Triage Notes (Signed)
Pt presents to ED c/o headaches and feeling weak for 3 days. Pt states she has been taking BC powders without change in headache. Hx DM type 1, HTN, WPW.

## 2019-05-16 ENCOUNTER — Emergency Department
Admission: EM | Admit: 2019-05-16 | Discharge: 2019-05-16 | Disposition: A | Discharge status: Home / Self Care | Payer: BC Managed Care – PPO | Attending: Emergency Medicine | Admitting: Emergency Medicine

## 2019-05-16 LAB — CK: Total CK: 125 U/L (ref 38–234)

## 2019-05-21 ENCOUNTER — Telehealth: Payer: Self-pay | Admitting: Emergency Medicine

## 2019-05-21 NOTE — Telephone Encounter (Signed)
Called patient due to lwot to inquire about condition and follow up plans.  No answer and no voicemail  

## 2019-05-29 ENCOUNTER — Other Ambulatory Visit: Payer: Self-pay

## 2019-05-29 ENCOUNTER — Ambulatory Visit: Payer: Self-pay

## 2019-05-29 DIAGNOSIS — Z113 Encounter for screening for infections with a predominantly sexual mode of transmission: Secondary | ICD-10-CM

## 2019-05-29 DIAGNOSIS — N739 Female pelvic inflammatory disease, unspecified: Secondary | ICD-10-CM

## 2019-05-29 DIAGNOSIS — I10 Essential (primary) hypertension: Secondary | ICD-10-CM | POA: Insufficient documentation

## 2019-05-29 DIAGNOSIS — I456 Pre-excitation syndrome: Secondary | ICD-10-CM | POA: Insufficient documentation

## 2019-05-29 LAB — WET PREP FOR TRICH, YEAST, CLUE
Trichomonas Exam: NEGATIVE
Yeast Exam: NEGATIVE

## 2019-05-29 MED ORDER — LEVOFLOXACIN 500 MG PO TABS: 500.0000 mg | ORAL_TABLET | Freq: Every day | ORAL | 0 refills | Status: DC

## 2019-05-29 MED ORDER — METRONIDAZOLE 500 MG PO TABS: 500.0000 mg | ORAL_TABLET | Freq: Two times a day (BID) | ORAL | 0 refills | Status: AC

## 2019-05-29 NOTE — Progress Notes (Signed)
    STI clinic/screening visit  Subjective:  Jody Chavez is a 38 y.o. female being seen today for an STI screening visit. The patient reports they do have symptoms.  Patient has the following medical conditions:   Patient Active Problem List   Diagnosis Date Noted  . DKA (diabetic ketoacidoses) ([place]) 11/01/2018  . Generalized abdominal pain 04/06/2017  . DKA (diabetic ketoacidosis) ([place]) 11/03/2016  . Difficulty in swallowing 11/20/2015     Chief Complaint  Patient presents with  . SEXUALLY TRANSMITTED DISEASE    HPI  Patient reports that last month she was treated for trich and chlamydia.  She states that she vomited the medications right after she took them but didn't return until today for re-treatment.  She states that she has a dfficult swallowing pills, usually she has to crush them to take.  Her partner was not treated for chlamydia  But did receive treatment for trich.  Client states that she has chronic abd pain but since chlamydia dx her lower abd pain may have changed in intensity. Denies other sympts- fever, n/v, disch., rash/lesion, pain with sex.    See flowsheet for further details and programmatic requirements.    The following portions of the patient's history were reviewed and updated as appropriate: allergies, current medications, past medical history, past social history, past surgical history and problem list.  Objective:  There were no vitals filed for this visit.  Physical Exam HENT:     Mouth/Throat:     Pharynx: Oropharynx is clear.  Neck:     Musculoskeletal: Neck supple. No muscular tenderness.  Genitourinary:    General: Normal vulva.     Comments: Blood noted in vaginal vault. Bimanual exam- + CMT, with slight adnexal tenderness L>R, no masses Lymphadenopathy:     Cervical: No cervical adenopathy.  Skin:    General: Skin is warm.     Findings: No lesion or rash.  Neurological:     Mental Status: She is alert.       Assessment and  Plan:  Jody Chavez is a 38 y.o. female presenting to the [facility] Department for STI screening  1. Screening examination for venereal disease - Chlamydia/Gonorrhea [place] [facility] - WET PREP FOR [place], YEAST, CLUE -Treat wet prep as per SO  2. Pelvic inflammatory disease - levofloxacin (LEVAQUIN) 500 MG tablet; Take 1 tablet (500 mg total) by mouth daily.  Dispense: 7 tablet; Refill: 0 - metroNIDAZOLE (FLAGYL) 500 MG tablet; Take 1 tablet (500 mg total) by mouth 2 (two) times daily for 14 days.  Dispense: 28 tablet; Refill: 0.  Written RX given to client to take to compounding pharmacy - return to clinic 2 days for f/u -Co to contact clinic if has problem or sympts increase ( fever, pain, etc) -Partner needs treatment. -Co no sexual activity until client completes treatment.     No follow-ups on file.  No future appointments.  Hassell Done, FNP

## 2019-05-29 NOTE — Progress Notes (Signed)
Declines HIV/RPR and STD screening.  Patient reports recently tx'd for Chlamydia, Trich and BV (received cream for BV). Vomited MTZ. Tolerated AZMCN but had sex before 7 days with partner. States partner was in the am for re-tx. Co patient to make sure partner received the same tx as she will today.  Patient verbalized understanding. Aileen Fass, RN

## 2019-06-18 ENCOUNTER — Telehealth: Payer: Self-pay | Admitting: General Practice

## 2019-06-18 NOTE — Telephone Encounter (Signed)
CLIENT WOULD LIKE TR  REFUSED TO COME IN TO PU RESULTS

## 2019-06-18 NOTE — Telephone Encounter (Signed)
Returned patient phone call. Patient requesting TR from 05/29/19 STD appt. Patient gave correct password on chart. All TR given from this appt. All questions answered. Hal Morales, RN

## 2019-06-23 ENCOUNTER — Emergency Department
Admission: EM | Admit: 2019-06-23 | Discharge: 2019-06-23 | Disposition: A | Discharge status: Home / Self Care | Payer: Medicaid Other | Attending: Emergency Medicine | Admitting: Emergency Medicine

## 2019-06-23 ENCOUNTER — Emergency Department: Payer: Medicaid Other

## 2019-06-23 DIAGNOSIS — Z794 Long term (current) use of insulin: Secondary | ICD-10-CM | POA: Insufficient documentation

## 2019-06-23 DIAGNOSIS — E119 Type 2 diabetes mellitus without complications: Secondary | ICD-10-CM | POA: Insufficient documentation

## 2019-06-23 DIAGNOSIS — F17210 Nicotine dependence, cigarettes, uncomplicated: Secondary | ICD-10-CM | POA: Insufficient documentation

## 2019-06-23 DIAGNOSIS — R102 Pelvic and perineal pain: Secondary | ICD-10-CM | POA: Insufficient documentation

## 2019-06-23 DIAGNOSIS — Z79899 Other long term (current) drug therapy: Secondary | ICD-10-CM | POA: Insufficient documentation

## 2019-06-23 DIAGNOSIS — N939 Abnormal uterine and vaginal bleeding, unspecified: Secondary | ICD-10-CM | POA: Insufficient documentation

## 2019-06-23 DIAGNOSIS — I10 Essential (primary) hypertension: Secondary | ICD-10-CM | POA: Insufficient documentation

## 2019-06-23 HISTORY — DX: Unspecified ectopic pregnancy without intrauterine pregnancy: O00.90

## 2019-06-23 LAB — COMPREHENSIVE METABOLIC PANEL
ALT: 17 U/L (ref 0–44)
AST: 26 U/L (ref 15–41)
Albumin: 3.4 g/dL — ABNORMAL LOW (ref 3.5–5.0)
Alkaline Phosphatase: 64 U/L (ref 38–126)
Anion gap: 10 (ref 5–15)
BUN: 28 mg/dL — ABNORMAL HIGH (ref 6–20)
CO2: 25 mmol/L (ref 22–32)
Calcium: 8.9 mg/dL (ref 8.9–10.3)
Chloride: 102 mmol/L (ref 98–111)
Creatinine, Ser: 1.28 mg/dL — ABNORMAL HIGH (ref 0.44–1.00)
GFR calc Af Amer: >60 mL/min (ref >60)
GFR calc non Af Amer: 54 mL/min — ABNORMAL LOW (ref >60)
Glucose, Bld: 213 mg/dL — ABNORMAL HIGH (ref 70–99)
Potassium: 3.5 mmol/L (ref 3.5–5.1)
Sodium: 137 mmol/L (ref 135–145)
Total Bilirubin: 0.6 mg/dL (ref 0.3–1.2)
Total Protein: 6.6 g/dL (ref 6.5–8.1)

## 2019-06-23 LAB — CBC
HCT: 35 % — ABNORMAL LOW (ref 36.0–46.0)
Hemoglobin: 11.7 g/dL — ABNORMAL LOW (ref 12.0–15.0)
MCH: 29.8 pg (ref 26.0–34.0)
MCHC: 33.4 g/dL (ref 30.0–36.0)
MCV: 89.1 fL (ref 80.0–100.0)
Platelets: 272 10*3/uL (ref 150–400)
RBC: 3.93 MIL/uL (ref 3.87–5.11)
RDW: 15.4 % (ref 11.5–15.5)
WBC: 9 10*3/uL (ref 4.0–10.5)
nRBC: 0 % (ref 0.0–0.2)

## 2019-06-23 LAB — POCT PREGNANCY, URINE: Preg Test, Ur: NEGATIVE

## 2019-06-23 LAB — HCG, QUANTITATIVE, PREGNANCY: hCG, Beta Chain, Quant, S: <1 m[IU]/mL (ref <5)

## 2019-06-23 MED ORDER — SODIUM CHLORIDE 0.9 % IV BOLUS
1000.0000 mL | Freq: Once | INTRAVENOUS | Status: AC
  Administered 2019-06-23: 1000 mL via INTRAVENOUS

## 2019-06-23 MED ORDER — ONDANSETRON HCL 4 MG/2ML IJ SOLN
4.0000 mg | Freq: Once | INTRAMUSCULAR | Status: AC
  Administered 2019-06-23: 4 mg via INTRAVENOUS
  Filled 2019-06-23: qty 2

## 2019-06-23 MED ORDER — MORPHINE SULFATE (PF) 2 MG/ML IV SOLN
2.0000 mg | Freq: Once | INTRAVENOUS | Status: AC
  Administered 2019-06-23: 2 mg via INTRAVENOUS
  Filled 2019-06-23: qty 1

## 2019-06-23 NOTE — ED Triage Notes (Signed)
Patient c/o lower abdominal pain and vaginal bleeding described as spotting. Patient reports past hx of 3 previous ectopic pregnancies. Patient reports that she normally menstruates at the beginning of the month, and did not this month.

## 2019-06-23 NOTE — ED Provider Notes (Signed)
[facility] Emergency Department Provider Note  ____________________________________________   First MD Initiated Contact with Patient 06/23/19 0310     (approximate)  I have reviewed the triage vital signs and the nursing notes.   HISTORY  Chief Complaint Abdominal Pain   HPI Jody Chavez is a 38 y.o. female with below list of previous medical conditions including 3 ectopic pregnancies and status post right salpingectomy presents to the emergency department secondary to lower abdominal pain with associated vaginal bleeding that the patient describes as spotting.  Patient denies any diarrhea constipation.  Patient denies any nausea.  Patient denies any urinary symptoms.  Of note the patient states that her menses usually occur in the beginning of the month and did not occur this month thus far.       Past Medical History:  Diagnosis Date   Anxiety    Depressed    Diabetes mellitus without complication (HCC)    Ectopic pregnancy    Hypertension    IBS (irritable bowel syndrome)    Paroxysmal SVT (supraventricular tachycardia) (HCC) WPW   Wolff-Parkinson-White (WPW) syndrome     Patient Active Problem List   Diagnosis Date Noted   DKA (diabetic ketoacidoses) (HCC) 11/01/2018   Generalized abdominal pain 04/06/2017   DKA (diabetic ketoacidosis) (HCC) 11/03/2016   Difficulty in swallowing 11/20/2015    Past Surgical History:  Procedure Laterality Date   ABDOMINAL SURGERY     CARDIAC ELECTROPHYSIOLOGY MAPPING AND ABLATION     CESAREAN SECTION     CHOLECYSTECTOMY     DIAGNOSTIC LAPAROSCOPY WITH REMOVAL OF ECTOPIC PREGNANCY Right 03/04/2016   Procedure: DIAGNOSTIC LAPAROSCOPY WITH right salpingectomy and REMOVAL OF ECTOPIC PREGNANCY, extensive lysis of adhesions;  Surgeon: Suzy Bouchardhomas J Schermerhorn, MD;  Location: [facility] ORS;  Service: Gynecology;  Laterality: Right;   ENDOMETRIAL ABLATION     HEART ABLATION      Prior to Admission  medications   Medication Sig Start Date End Date Taking? Authorizing Provider  ARIPiprazole (ABILIFY) 10 MG tablet Take 10 mg by mouth daily.    [provider]  escitalopram (LEXAPRO) 20 MG tablet Take 20 mg by mouth daily.    [provider]  ibuprofen (ADVIL,MOTRIN) 600 MG tablet Take 1 tablet (600 mg total) by mouth every 6 (six) hours as needed. 11/24/18   Domenick GongMortenson, Ashley, MD  Insulin Aspart (NOVOLOG FLEXPEN [place]) Inject into the skin.    [provider]  insulin detemir (LEVEMIR) 100 UNIT/ML injection Inject 36 Units into the skin daily.    [provider]  Insulin Syringe-Needle U-100 (INSULIN SYRINGE .3CC/31GX5/16") 31G X 5/16" 0.3 ML MISC 5 times daily as directed. 11/04/16   Houston SirenSainani, Vivek J, MD  levofloxacin (LEVAQUIN) 500 MG tablet Take 1 tablet (500 mg total) by mouth daily. 05/29/19   Larene PickettLatta, Cynthia, FNP  lisinopril (PRINIVIL,ZESTRIL) 40 MG tablet Take 1 tablet (40 mg total) by mouth daily. 11/02/18   Auburn BilberryPatel, Shreyang, MD  Polyethylene Glycol 3350 (PEG 3350) POWD Take 17 g by mouth 2 (two) times daily as needed. 02/13/18   [provider]    Allergies Iodine, Shellfish allergy, and Penicillins  Family History  Problem Relation Age of Onset   Gallbladder disease Mother     Social History Social History   Tobacco Use   Smoking status: Current Every Day Smoker    Packs/day: 1.00    Types: Cigarettes   Smokeless tobacco: Never Used  Substance Use Topics   Alcohol use: Yes  Comment: socially   Drug use: No    Review of Systems Constitutional: No fever/chills Eyes: No visual changes. ENT: No sore throat. Cardiovascular: Denies chest pain. Respiratory: Denies shortness of breath. Gastrointestinal: Positive for abdominal pain.  No nausea, no vomiting.  No diarrhea.  No constipation. Genitourinary: Negative for dysuria. Musculoskeletal: Negative for neck pain.  Negative for back pain. Integumentary: Negative for  rash. Neurological: Negative for headaches, focal weakness or numbness. { ____________________________________________   PHYSICAL EXAM:  VITAL SIGNS: ED Triage Vitals  Enc Vitals Group     BP 06/23/19 0307 (!) 141/94     Pulse Rate 06/23/19 0307 85     Resp 06/23/19 0307 18     Temp 06/23/19 0309 98.3 F (36.8 C)     Temp Source 06/23/19 0309 Oral     SpO2 06/23/19 0307 99 %     Weight 06/23/19 0305 81.6 kg (180 lb)     Height 06/23/19 0305 1.6 m (5\' 3" )     Head Circumference --      Peak Flow --      Pain Score 06/23/19 0306 6     Pain Loc --      Pain Edu? --      Excl. in [place]? --     Constitutional: Alert and oriented.  Eyes: Conjunctivae are normal.  Mouth/Throat: Mucous membranes are moist. Neck: No stridor.  No meningeal signs.   Cardiovascular: Normal rate, regular rhythm. Good peripheral circulation. Grossly normal heart sounds. Respiratory: Normal respiratory effort.  No retractions. Gastrointestinal: Generalized tenderness to palpation.  No distention.  Musculoskeletal: No lower extremity tenderness nor edema. No gross deformities of extremities. Neurologic:  Normal speech and language. No gross focal neurologic deficits are appreciated.  Skin:  Skin is warm, dry and intact. Psychiatric: Mood and affect are normal. Speech and behavior are normal.  ____________________________________________   LABS (all labs ordered are listed, but only abnormal results are displayed)  Labs Reviewed  CBC - Abnormal; Notable for the following components:      Result Value   Hemoglobin 11.7 (*)    HCT 35.0 (*)    All other components within normal limits  COMPREHENSIVE METABOLIC PANEL - Abnormal; Notable for the following components:   Glucose, Bld 213 (*)    BUN 28 (*)    Creatinine, Ser 1.28 (*)    Albumin 3.4 (*)    GFR calc non Af Amer 54 (*)    All other components within normal limits  HCG, QUANTITATIVE, PREGNANCY  POC URINE PREG, ED  POCT PREGNANCY, URINE    ______________________________________  RADIOLOGY I,  N Jamar Weatherall, personally viewed and evaluated these images (plain radiographs) as part of my medical decision making, as well as reviewing the written report by the radiologist.  ED MD interpretation: Pelvic ultrasound and CT abdomen pelvis revealed no acute abnormality per radiologist.  Official radiology report(s): Ct Abdomen Pelvis Wo Contrast  Result Date: 06/23/2019 CLINICAL DATA:  Periumbilical abdominal pain. History of ectopic pregnancies. EXAM: CT ABDOMEN AND PELVIS WITHOUT CONTRAST TECHNIQUE: Multidetector CT imaging of the abdomen and pelvis was performed following the standard protocol without IV contrast. COMPARISON:  None. FINDINGS: Lower chest: Normal heart size. 4 mm subpleural right lower lobe nodule (image 5; series 3). Dependent atelectasis within the bilateral lower lobes. Hepatobiliary: Liver is normal in size and contour. Fatty deposition adjacent to the falciform ligament. Prior cholecystectomy. Pancreas: Unremarkable Spleen: Unremarkable Adrenals/Urinary Tract: Normal adrenal glands. 2.0 cm cyst inferior pole right  kidney. No hydronephrosis. Urinary bladder is unremarkable. Stomach/Bowel: No abnormal bowel wall thickening or evidence for bowel obstruction. No free fluid or free intraperitoneal air. Normal appearance of the appendix. Vascular/Lymphatic: Normal caliber abdominal aorta. No retroperitoneal lymphadenopathy. Reproductive: Uterus and adnexal structures are unremarkable. Other: None. Musculoskeletal: No aggressive or acute appearing osseous lesions. IMPRESSION: No acute process within the abdomen or pelvis. 4 mm subpleural right lower lobe nodule. No follow-up needed if patient is low-risk. Non-contrast chest CT can be considered in 12 months if patient is high-risk. This recommendation follows the consensus statement: Guidelines for Management of Incidental Pulmonary Nodules Detected on CT Images: From the  Fleischner Society 2017; Radiology 2017; 284:228-243. Electronically Signed   By: Lovey Newcomer M.D.   On: 06/23/2019 05:56   US Pelvic Complete With Transvaginal  Result Date: 06/23/2019 CLINICAL DATA:  Pelvic pain. EXAM: TRANSABDOMINAL AND TRANSVAGINAL ULTRASOUND OF PELVIS TECHNIQUE: Both transabdominal and transvaginal ultrasound examinations of the pelvis were performed. Transabdominal technique was performed for global imaging of the pelvis including uterus, ovaries, adnexal regions, and pelvic cul-de-sac. It was necessary to proceed with endovaginal exam following the transabdominal exam to visualize the adnexal structures. COMPARISON:  None FINDINGS: Uterus Measurements: 6.9 x 4.3 x 4.4 cm = volume: 67.2 mL. No fibroids or other mass visualized. Endometrium Thickness: 6 mm.  No focal abnormality visualized. Right ovary Measurements: 2.7 x 1.1 x 2.4 cm = volume: 3.7 mL. Normal appearance/no adnexal mass. Left ovary Measurements: 3.2 x 1.7 x 2.3 cm = volume: 6.6 mL. Normal appearance/no adnexal mass. Other findings Dilated vessels within the left hemipelvis. IMPRESSION: No acute process within the pelvis. Electronically Signed   By: Lovey Newcomer M.D.   On: 06/23/2019 04:59      Procedures   ____________________________________________   INITIAL IMPRESSION / MDM / ASSESSMENT AND PLAN / ED COURSE  As part of my medical decision making, I reviewed the following data within the electronic MEDICAL RECORD NUMBER  38 year old female presented with above-stated history and physical exam secondary to abdominal/pelvic pain and vaginal spotting.  Considered possibility of ectopic pregnancy given patient's previous history of 3 ectopic pregnancies however patient's hCG quantitative less than 1.  Pelvic ultrasound revealed no acute abnormality.  CT scan of the abdomen pelvis was performed which also was unremarkable.  Patient given IV morphine and on reevaluation patient states that pain is completely resolved.   Patient will be referred to gynecology for further outpatient evaluation.  ____________________________________________  FINAL CLINICAL IMPRESSION(S) / ED DIAGNOSES  Final diagnoses:  Pelvic pain     MEDICATIONS GIVEN DURING THIS VISIT:  Medications  morphine 2 MG/ML injection 2 mg (2 mg Intravenous Given 06/23/19 0344)  ondansetron (ZOFRAN) injection 4 mg (4 mg Intravenous Given 06/23/19 0341)  sodium chloride 0.9 % bolus 1,000 mL (0 mLs Intravenous Stopped 06/23/19 6606)     ED Discharge Orders    None      *Please note:  Jody Chavez was evaluated in Emergency Department on 06/23/2019 for the symptoms described in the history of present illness. She was evaluated in the context of the global COVID-19 pandemic, which necessitated consideration that the patient might be at risk for infection with the SARS-CoV-2 virus that causes COVID-19. Institutional protocols and algorithms that pertain to the evaluation of patients at risk for COVID-19 are in a state of rapid change based on information released by regulatory bodies including the CDC and federal and state organizations. These policies and algorithms were followed during the patient's  care in the ED.  Some ED evaluations and interventions may be delayed as a result of limited staffing during the pandemic.*  Note:  This document was prepared using Dragon voice recognition software and may include unintentional dictation errors.   Darci CurrentBrown, [place] N, MD 06/23/19 [phone]

## 2019-06-23 NOTE — ED Notes (Signed)
Pt back from CT; registration in to speak with pt

## 2019-06-23 NOTE — ED Notes (Signed)
Pt reports pain and tenderness around her umbilicus; history of 3 ectopic pregnancies and says the pain is similar to those events; normally has her menstrual cycle around the first of the month but has not had a regular period as of yet; has started having some spotting in the last few days; pain is cramping in nature; denies urinary s/s; nausea, mostly at night and in the am but denies vomiting;

## 2019-06-23 NOTE — ED Notes (Signed)
IV fluids complete; pt continues to rest well with eyes closed, resp even and unlabored; no complaints; waiting on MD follow up and CT results

## 2019-06-23 NOTE — ED Notes (Signed)
ED Provider at bedside. 

## 2019-06-23 NOTE — ED Notes (Signed)
In to check on pt after returning from CT; eyes closed, resp even and unlabored; VSS

## 2019-07-20 ENCOUNTER — Emergency Department
Admission: EM | Admit: 2019-07-20 | Discharge: 2019-07-20 | Disposition: A | Discharge status: Home / Self Care | Payer: Medicaid Other | Attending: Emergency Medicine | Admitting: Emergency Medicine

## 2019-07-20 ENCOUNTER — Other Ambulatory Visit: Payer: Self-pay

## 2019-07-20 DIAGNOSIS — R739 Hyperglycemia, unspecified: Secondary | ICD-10-CM

## 2019-07-20 DIAGNOSIS — F17210 Nicotine dependence, cigarettes, uncomplicated: Secondary | ICD-10-CM | POA: Insufficient documentation

## 2019-07-20 DIAGNOSIS — I10 Essential (primary) hypertension: Secondary | ICD-10-CM | POA: Insufficient documentation

## 2019-07-20 DIAGNOSIS — L0291 Cutaneous abscess, unspecified: Secondary | ICD-10-CM

## 2019-07-20 DIAGNOSIS — M791 Myalgia, unspecified site: Secondary | ICD-10-CM | POA: Insufficient documentation

## 2019-07-20 DIAGNOSIS — L02412 Cutaneous abscess of left axilla: Secondary | ICD-10-CM | POA: Insufficient documentation

## 2019-07-20 DIAGNOSIS — E1165 Type 2 diabetes mellitus with hyperglycemia: Secondary | ICD-10-CM | POA: Insufficient documentation

## 2019-07-20 LAB — URINALYSIS, COMPLETE (UACMP) WITH MICROSCOPIC
Bacteria, UA: NONE SEEN
Bilirubin Urine: NEGATIVE
Glucose, UA: >=500 mg/dL — AB
Hgb urine dipstick: NEGATIVE
Ketones, ur: NEGATIVE mg/dL
Leukocytes,Ua: NEGATIVE
Nitrite: POSITIVE — AB
Protein, ur: NEGATIVE mg/dL
Specific Gravity, Urine: 1.021 (ref 1.005–1.030)
pH: 6 (ref 5.0–8.0)

## 2019-07-20 LAB — CBC
HCT: 39.5 % (ref 36.0–46.0)
Hemoglobin: 13.1 g/dL (ref 12.0–15.0)
MCH: 29.4 pg (ref 26.0–34.0)
MCHC: 33.2 g/dL (ref 30.0–36.0)
MCV: 88.6 fL (ref 80.0–100.0)
Platelets: 277 10*3/uL (ref 150–400)
RBC: 4.46 MIL/uL (ref 3.87–5.11)
RDW: 15 % (ref 11.5–15.5)
WBC: 10.4 10*3/uL (ref 4.0–10.5)
nRBC: 0 % (ref 0.0–0.2)

## 2019-07-20 LAB — GLUCOSE, CAPILLARY
Glucose-Capillary: 215 mg/dL — ABNORMAL HIGH (ref 70–99)
Glucose-Capillary: 57 mg/dL — ABNORMAL LOW (ref 70–99)
Glucose-Capillary: 63 mg/dL — ABNORMAL LOW (ref 70–99)
Glucose-Capillary: 87 mg/dL (ref 70–99)

## 2019-07-20 LAB — BASIC METABOLIC PANEL
Anion gap: 10 (ref 5–15)
BUN: 12 mg/dL (ref 6–20)
CO2: 25 mmol/L (ref 22–32)
Calcium: 9 mg/dL (ref 8.9–10.3)
Chloride: 102 mmol/L (ref 98–111)
Creatinine, Ser: 0.87 mg/dL (ref 0.44–1.00)
GFR calc Af Amer: >60 mL/min (ref >60)
GFR calc non Af Amer: >60 mL/min (ref >60)
Glucose, Bld: 218 mg/dL — ABNORMAL HIGH (ref 70–99)
Potassium: 4 mmol/L (ref 3.5–5.1)
Sodium: 137 mmol/L (ref 135–145)

## 2019-07-20 MED ORDER — LIDOCAINE HCL (PF) 1 % IJ SOLN
INTRAMUSCULAR | Status: AC
  Administered 2019-07-20: 5 mL
  Filled 2019-07-20: qty 5

## 2019-07-20 MED ORDER — SODIUM CHLORIDE 0.9 % IV SOLN
Freq: Once | INTRAVENOUS | Status: AC
  Administered 2019-07-20: 22:00:00 via INTRAVENOUS

## 2019-07-20 MED ORDER — METOCLOPRAMIDE HCL 5 MG/ML IJ SOLN
10.0000 mg | Freq: Once | INTRAMUSCULAR | Status: AC
  Administered 2019-07-20: 22:00:00 10 mg via INTRAVENOUS
  Filled 2019-07-20: qty 2

## 2019-07-20 MED ORDER — MUPIROCIN 2 % EX OINT: TOPICAL_OINTMENT | CUTANEOUS | 0 refills | Status: AC

## 2019-07-20 MED ORDER — LIDOCAINE HCL (PF) 1 % IJ SOLN
5.0000 mL | Freq: Once | INTRAMUSCULAR | Status: AC
  Administered 2019-07-20: 22:00:00 5 mL

## 2019-07-20 MED ORDER — KETOROLAC TROMETHAMINE 30 MG/ML IJ SOLN
30.0000 mg | Freq: Once | INTRAMUSCULAR | Status: AC
  Administered 2019-07-20: 30 mg via INTRAVENOUS
  Filled 2019-07-20: qty 1

## 2019-07-20 NOTE — ED Notes (Signed)
BG 57. Pt given more OJ with 2 sugar packets mixed in.

## 2019-07-20 NOTE — ED Notes (Signed)
Pt understands and agrees to stay another 24mins post finishing OJ to have BG checked again.

## 2019-07-20 NOTE — ED Notes (Signed)
Attempted for 20g IV at R ac.  

## 2019-07-20 NOTE — ED Provider Notes (Signed)
[facility] Emergency Department Provider Note       Time seen: ----------------------------------------- 9:20 PM on 07/20/2019 -----------------------------------------   I have reviewed the triage vital signs and the nursing notes.  HISTORY   Chief Complaint Hyperglycemia and Generalized Body Aches    HPI Jody Chavez is a 38 y.o. female with a history of anxiety, depression, diabetes, hypertension, IBS who presents to the ED for hyperglycemia.  Patient states blood sugar at home was over 300s.  She is also having generalized body aches, pain is 7 out of 10.  Past Medical History:  Diagnosis Date  . Anxiety   . Depressed   . Diabetes mellitus without complication ([place])   . Ectopic pregnancy   . Hypertension   . IBS (irritable bowel syndrome)   . Paroxysmal SVT (supraventricular tachycardia) (HCC) WPW  . Wolff-Parkinson-White (WPW) syndrome     Patient Active Problem List   Diagnosis Date Noted  . DKA (diabetic ketoacidoses) ([place]) 11/01/2018  . Generalized abdominal pain 04/06/2017  . DKA (diabetic ketoacidosis) ([place]) 11/03/2016  . Difficulty in swallowing 11/20/2015    Past Surgical History:  Procedure Laterality Date  . ABDOMINAL SURGERY    . CARDIAC ELECTROPHYSIOLOGY MAPPING AND ABLATION    . CESAREAN SECTION    . CHOLECYSTECTOMY    . DIAGNOSTIC LAPAROSCOPY WITH REMOVAL OF ECTOPIC PREGNANCY Right 03/04/2016   Procedure: DIAGNOSTIC LAPAROSCOPY WITH right salpingectomy and REMOVAL OF ECTOPIC PREGNANCY, extensive lysis of adhesions;  Surgeon: Boykin Nearing, MD;  Location: [facility] ORS;  Service: Gynecology;  Laterality: Right;  . ENDOMETRIAL ABLATION    . HEART ABLATION      Allergies Iodine, Shellfish allergy, and Penicillins  Social History Social History   Tobacco Use  . Smoking status: Current Every Day Smoker    Packs/day: 1.00    Types: Cigarettes  . Smokeless tobacco: Never Used  Substance Use Topics  . Alcohol use:  Yes    Comment: socially  . Drug use: No   Review of Systems Constitutional: Negative for fever. Cardiovascular: Negative for chest pain. Respiratory: Negative for shortness of breath. Gastrointestinal: Negative for abdominal pain, vomiting and diarrhea. Musculoskeletal: Negative for back pain.  Positive for body aches Skin: Negative for rash. Neurological: Negative for headaches, focal weakness or numbness.  All systems negative/normal/unremarkable except as stated in the HPI  ____________________________________________   PHYSICAL EXAM:  VITAL SIGNS: ED Triage Vitals [07/20/19 2103]  Enc Vitals Group     BP (!) 141/98     Pulse Rate (!) 111     Resp 18     Temp 98.3 F (36.8 C)     Temp Source Oral     SpO2 100 %     Weight 175 lb (79.4 kg)     Height 5\' 3"  (1.6 m)     Head Circumference      Peak Flow      Pain Score 7     Pain Loc      Pain Edu?      Excl. in [place]?    Constitutional: Alert and oriented. Well appearing and in no distress. Eyes: Conjunctivae are normal. Normal extraocular movements. Cardiovascular: Normal rate, regular rhythm. No murmurs, rubs, or gallops. Respiratory: Normal respiratory effort without tachypnea nor retractions. Breath sounds are clear and equal bilaterally. No wheezes/rales/rhonchi. Gastrointestinal: Soft and nontender. Normal bowel sounds Musculoskeletal: Tenderness with induration and erythema in the left axilla consistent with abscess formation Neurologic:  Normal speech and language. No gross  focal neurologic deficits are appreciated.  Skin:  Skin is warm, dry and intact. No rash noted. Psychiatric: Mood and affect are normal. Speech and behavior are normal.  ____________________________________________  ED COURSE:  As part of my medical decision making, I reviewed the following data within the electronic MEDICAL RECORD NUMBER History obtained from family if available, nursing notes, old chart and ekg, as well as notes from prior  ED visits. Patient presented for body aches and hyperglycemia, we will assess with labs as indicated at this time.   Marland Kitchen..Incision and Drainage  Date/Time: 07/20/2019 10:44 PM Performed by: Emily FilbertWilliams, Lavaya Defreitas E, MD Authorized by: Emily FilbertWilliams, Malerie Eakins E, MD   Consent:    Consent obtained:  Verbal   Consent given by:  Patient Location:    Type:  Abscess   Size:  2.5 cm   Location:  Upper extremity Pre-procedure details:    Skin preparation:  Betadine Anesthesia (see MAR for exact dosages):    Anesthesia method:  Local infiltration   Local anesthetic:  Lidocaine 1% w/o epi Procedure type:    Complexity:  Simple Procedure details:    Incision types:  Single straight   Incision depth:  Subcutaneous   Scalpel blade:  11   Drainage:  Purulent   Drainage amount:  Moderate   Packing materials:  1/4 in gauze Post-procedure details:    Patient tolerance of procedure:  Tolerated well, no immediate complications    Jody LawrenceShonda Bolen was evaluated in Emergency Department on 07/20/2019 for the symptoms described in the history of present illness. She was evaluated in the context of the global COVID-19 pandemic, which necessitated consideration that the patient might be at risk for infection with the SARS-CoV-2 virus that causes COVID-19. Institutional protocols and algorithms that pertain to the evaluation of patients at risk for COVID-19 are in a state of rapid change based on information released by regulatory bodies including the CDC and federal and state organizations. These policies and algorithms were followed during the patient's care in the ED.  ____________________________________________   LABS (pertinent positives/negatives)  Labs Reviewed  BASIC METABOLIC PANEL - Abnormal; Notable for the following components:      Result Value   Glucose, Bld 218 (*)    All other components within normal limits  GLUCOSE, CAPILLARY - Abnormal; Notable for the following components:   Glucose-Capillary  215 (*)    All other components within normal limits  CBC  URINALYSIS, COMPLETE (UACMP) WITH MICROSCOPIC  CBG MONITORING, ED  POC URINE PREG, ED  ____________________________________________   DIFFERENTIAL DIAGNOSIS   Dehydration, electrolyte abnormality, DKA, occult infection  FINAL ASSESSMENT AND PLAN  Hyperglycemia, myalgias, left axillary abscess   Plan: The patient had presented for high blood sugar and body aches. Patient's labs did reveal minimal hyperglycemia but otherwise no acute process.  Left axillary abscess was incised and drained and packed.  She is advised to take her packing out tomorrow.  She is cleared for outpatient follow-up.   Ulice DashJohnathan E Shelsey Rieth, MD    Note: This note was generated in part or whole with voice recognition software. Voice recognition is usually quite accurate but there are transcription errors that can and very often do occur. I apologize for any typographical errors that were not detected and corrected.     Emily FilbertWilliams, Caine Barfield E, MD 07/20/19 2245

## 2019-07-20 NOTE — ED Notes (Signed)
BG 63. Pt given OJ x2.

## 2019-07-20 NOTE — ED Notes (Signed)
BG 87

## 2019-07-20 NOTE — ED Notes (Signed)
Wells Guiles RN to obtain IV. Will attempt once EDP done draining cyst.

## 2019-07-20 NOTE — ED Notes (Signed)
EDP at bedside  

## 2019-07-20 NOTE — ED Notes (Signed)
EDP Williams with lidocaine to bedside.

## 2019-07-20 NOTE — ED Notes (Addendum)
Attempted 22g at L fa. Will have 2nd RN try.

## 2019-07-20 NOTE — ED Notes (Addendum)
Pt has cyst-like spot under L armpit. States it is hot and painful. It is quart-sized. Pt hypertensive. States body aches, HA, and low grade fever at home (99.1 per pt). Pt understands that a urine sample is needed.

## 2019-07-20 NOTE — ED Triage Notes (Signed)
Patient reports "sugar" has been up at home last at 5:00 was 378.  Reports also having generalized body aches.

## 2019-07-20 NOTE — ED Notes (Signed)
Pt given 2 warm blankets

## 2019-11-04 ENCOUNTER — Encounter: Payer: Self-pay | Admitting: Emergency Medicine

## 2019-11-04 DIAGNOSIS — R109 Unspecified abdominal pain: Secondary | ICD-10-CM | POA: Insufficient documentation

## 2019-11-04 DIAGNOSIS — Z5321 Procedure and treatment not carried out due to patient leaving prior to being seen by health care provider: Secondary | ICD-10-CM | POA: Insufficient documentation

## 2019-11-04 DIAGNOSIS — R509 Fever, unspecified: Secondary | ICD-10-CM | POA: Insufficient documentation

## 2019-11-04 LAB — CBC
HCT: 41.6 % (ref 36.0–46.0)
Hemoglobin: 13.6 g/dL (ref 12.0–15.0)
MCH: 28.8 pg (ref 26.0–34.0)
MCHC: 32.7 g/dL (ref 30.0–36.0)
MCV: 88.1 fL (ref 80.0–100.0)
Platelets: 260 10*3/uL (ref 150–400)
RBC: 4.72 MIL/uL (ref 3.87–5.11)
RDW: 15.1 % (ref 11.5–15.5)
WBC: 6.8 10*3/uL (ref 4.0–10.5)
nRBC: 0 % (ref 0.0–0.2)

## 2019-11-04 LAB — URINALYSIS, COMPLETE (UACMP) WITH MICROSCOPIC
Bilirubin Urine: NEGATIVE
Glucose, UA: >=500 mg/dL — AB
Hgb urine dipstick: NEGATIVE
Ketones, ur: NEGATIVE mg/dL
Nitrite: NEGATIVE
Protein, ur: NEGATIVE mg/dL
Specific Gravity, Urine: 1.024 (ref 1.005–1.030)
WBC, UA: >50 WBC/hpf — ABNORMAL HIGH (ref 0–5)
pH: 5 (ref 5.0–8.0)

## 2019-11-04 LAB — COMPREHENSIVE METABOLIC PANEL
ALT: 20 U/L (ref 0–44)
AST: 24 U/L (ref 15–41)
Albumin: 3.8 g/dL (ref 3.5–5.0)
Alkaline Phosphatase: 62 U/L (ref 38–126)
Anion gap: 10 (ref 5–15)
BUN: 17 mg/dL (ref 6–20)
CO2: 23 mmol/L (ref 22–32)
Calcium: 9 mg/dL (ref 8.9–10.3)
Chloride: 103 mmol/L (ref 98–111)
Creatinine, Ser: 0.99 mg/dL (ref 0.44–1.00)
GFR calc Af Amer: >60 mL/min (ref >60)
GFR calc non Af Amer: >60 mL/min (ref >60)
Glucose, Bld: 200 mg/dL — ABNORMAL HIGH (ref 70–99)
Potassium: 4.6 mmol/L (ref 3.5–5.1)
Sodium: 136 mmol/L (ref 135–145)
Total Bilirubin: 0.9 mg/dL (ref 0.3–1.2)
Total Protein: 6.8 g/dL (ref 6.5–8.1)

## 2019-11-04 LAB — LIPASE, BLOOD: Lipase: 28 U/L (ref 11–51)

## 2019-11-04 LAB — POCT PREGNANCY, URINE: Preg Test, Ur: NEGATIVE

## 2019-11-04 NOTE — ED Triage Notes (Signed)
Pt c/o fever x3 days and today pt started to have left lower abdominal pain and urinary frequency.

## 2019-11-05 ENCOUNTER — Emergency Department
Admission: EM | Admit: 2019-11-05 | Discharge: 2019-11-05 | Disposition: A | Discharge status: Home / Self Care | Payer: Medicaid Other | Attending: Emergency Medicine | Admitting: Emergency Medicine

## 2019-11-06 ENCOUNTER — Telehealth: Payer: Self-pay | Admitting: Emergency Medicine

## 2019-11-06 NOTE — Telephone Encounter (Signed)
Called patient due to lwot to inquire about condition and follow up plans.left message 

## 2020-01-08 ENCOUNTER — Other Ambulatory Visit: Payer: Self-pay

## 2020-01-08 ENCOUNTER — Ambulatory Visit: Payer: Self-pay

## 2020-01-08 DIAGNOSIS — Z021 Encounter for pre-employment examination: Secondary | ICD-10-CM

## 2020-01-08 LAB — POCT URINE DRUG SCREEN
POC Amphetamine UR: NOT DETECTED
POC Cocaine UR: NOT DETECTED
POC Marijuana UR: NOT DETECTED
POC Methamphetamine UR: NOT DETECTED
POC Opiate Ur: NOT DETECTED
POC PHENCYCLIDINE UR: NOT DETECTED
URINE TEMPERATURE: 96 Degrees F (ref 90.0–100.0)

## 2020-01-08 NOTE — Progress Notes (Signed)
     Patient ID: Jody Chavez, female    DOB: 1981/04/26, 39 y.o.   MRN: [id number]    Thank you!!  Danise Edge RN  [facility]  Occupational Health Nurse Specialist [facility] Direct Dial: [phone]  Cell:  [phone] Website: [email or web address]

## 2020-01-16 ENCOUNTER — Ambulatory Visit: Payer: Medicaid Other

## 2020-02-14 ENCOUNTER — Emergency Department
Admission: EM | Admit: 2020-02-14 | Discharge: 2020-02-15 | Disposition: A | Discharge status: Home / Self Care | Payer: Medicaid Other | Attending: Emergency Medicine | Admitting: Emergency Medicine

## 2020-02-14 ENCOUNTER — Other Ambulatory Visit: Payer: Self-pay

## 2020-02-14 DIAGNOSIS — R2232 Localized swelling, mass and lump, left upper limb: Secondary | ICD-10-CM | POA: Insufficient documentation

## 2020-02-14 DIAGNOSIS — Z5321 Procedure and treatment not carried out due to patient leaving prior to being seen by health care provider: Secondary | ICD-10-CM | POA: Insufficient documentation

## 2020-02-14 LAB — COMPREHENSIVE METABOLIC PANEL
ALT: 15 U/L (ref 0–44)
AST: 18 U/L (ref 15–41)
Albumin: 3.9 g/dL (ref 3.5–5.0)
Alkaline Phosphatase: 63 U/L (ref 38–126)
Anion gap: 9 (ref 5–15)
BUN: 17 mg/dL (ref 6–20)
CO2: 22 mmol/L (ref 22–32)
Calcium: 9.3 mg/dL (ref 8.9–10.3)
Chloride: 105 mmol/L (ref 98–111)
Creatinine, Ser: 0.77 mg/dL (ref 0.44–1.00)
GFR calc Af Amer: >60 mL/min (ref >60)
GFR calc non Af Amer: >60 mL/min (ref >60)
Glucose, Bld: 190 mg/dL — ABNORMAL HIGH (ref 70–99)
Potassium: 4.2 mmol/L (ref 3.5–5.1)
Sodium: 136 mmol/L (ref 135–145)
Total Bilirubin: 0.8 mg/dL (ref 0.3–1.2)
Total Protein: 7.4 g/dL (ref 6.5–8.1)

## 2020-02-14 LAB — URINALYSIS, COMPLETE (UACMP) WITH MICROSCOPIC
Bacteria, UA: NONE SEEN
Bilirubin Urine: NEGATIVE
Glucose, UA: >=500 mg/dL — AB
Hgb urine dipstick: NEGATIVE
Ketones, ur: NEGATIVE mg/dL
Leukocytes,Ua: NEGATIVE
Nitrite: NEGATIVE
Protein, ur: 30 mg/dL — AB
Specific Gravity, Urine: 1.038 — ABNORMAL HIGH (ref 1.005–1.030)
pH: 5 (ref 5.0–8.0)

## 2020-02-14 LAB — BLOOD GAS, VENOUS
Acid-base deficit: 1.3 mmol/L (ref 0.0–2.0)
Bicarbonate: 23.7 mmol/L (ref 20.0–28.0)
O2 Saturation: 88.6 %
Patient temperature: 37
pCO2, Ven: 40 mmHg — ABNORMAL LOW (ref 44.0–60.0)
pH, Ven: 7.38 (ref 7.250–7.430)
pO2, Ven: 57 mmHg — ABNORMAL HIGH (ref 32.0–45.0)

## 2020-02-14 LAB — CBC WITH DIFFERENTIAL/PLATELET
Abs Immature Granulocytes: 0.03 10*3/uL (ref 0.00–0.07)
Basophils Absolute: 0 10*3/uL (ref 0.0–0.1)
Basophils Relative: 1 %
Eosinophils Absolute: 0.1 10*3/uL (ref 0.0–0.5)
Eosinophils Relative: 3 %
HCT: 39.5 % (ref 36.0–46.0)
Hemoglobin: 13.5 g/dL (ref 12.0–15.0)
Immature Granulocytes: 1 %
Lymphocytes Relative: 23 %
Lymphs Abs: 1.1 10*3/uL (ref 0.7–4.0)
MCH: 29.2 pg (ref 26.0–34.0)
MCHC: 34.2 g/dL (ref 30.0–36.0)
MCV: 85.5 fL (ref 80.0–100.0)
Monocytes Absolute: 0.9 10*3/uL (ref 0.1–1.0)
Monocytes Relative: 17 %
Neutro Abs: 2.8 10*3/uL (ref 1.7–7.7)
Neutrophils Relative %: 55 %
Platelets: 250 10*3/uL (ref 150–400)
RBC: 4.62 MIL/uL (ref 3.87–5.11)
RDW: 13.8 % (ref 11.5–15.5)
WBC: 4.9 10*3/uL (ref 4.0–10.5)
nRBC: 0 % (ref 0.0–0.2)

## 2020-02-14 LAB — POCT PREGNANCY, URINE: Preg Test, Ur: NEGATIVE

## 2020-02-14 LAB — LIPASE, BLOOD: Lipase: 25 U/L (ref 11–51)

## 2020-02-14 LAB — GLUCOSE, CAPILLARY: Glucose-Capillary: 165 mg/dL — ABNORMAL HIGH (ref 70–99)

## 2020-02-14 LAB — PREGNANCY, URINE: Preg Test, Ur: NEGATIVE

## 2020-02-14 NOTE — ED Triage Notes (Signed)
Patient reports having both COVID vacinations. Since first shot (right arm) patient c/o left arm swelling. Patient c/o hyperglycemia since first shot. Patient c/o  Nausea. Patient c/o right hand tingling/shocking sensation. Patient c/o headache. Patient reports second vaccination yesterday. Patient reports symptoms have worsened since second vaccination.   Patient's home CBG 390, took 14 unit novolog approx 2030. Patient CBG in triage 165.

## 2020-02-15 ENCOUNTER — Other Ambulatory Visit: Payer: Self-pay

## 2020-02-15 ENCOUNTER — Emergency Department
Admission: EM | Admit: 2020-02-15 | Discharge: 2020-02-15 | Disposition: A | Discharge status: Home / Self Care | Payer: Self-pay | Attending: Emergency Medicine | Admitting: Emergency Medicine

## 2020-02-15 ENCOUNTER — Emergency Department: Payer: Self-pay

## 2020-02-15 ENCOUNTER — Encounter: Payer: Self-pay | Admitting: Emergency Medicine

## 2020-02-15 DIAGNOSIS — I10 Essential (primary) hypertension: Secondary | ICD-10-CM | POA: Insufficient documentation

## 2020-02-15 DIAGNOSIS — Z794 Long term (current) use of insulin: Secondary | ICD-10-CM | POA: Insufficient documentation

## 2020-02-15 DIAGNOSIS — R2231 Localized swelling, mass and lump, right upper limb: Secondary | ICD-10-CM | POA: Insufficient documentation

## 2020-02-15 DIAGNOSIS — R739 Hyperglycemia, unspecified: Secondary | ICD-10-CM

## 2020-02-15 DIAGNOSIS — F17210 Nicotine dependence, cigarettes, uncomplicated: Secondary | ICD-10-CM | POA: Insufficient documentation

## 2020-02-15 DIAGNOSIS — Z79899 Other long term (current) drug therapy: Secondary | ICD-10-CM | POA: Insufficient documentation

## 2020-02-15 DIAGNOSIS — R229 Localized swelling, mass and lump, unspecified: Secondary | ICD-10-CM

## 2020-02-15 DIAGNOSIS — E1165 Type 2 diabetes mellitus with hyperglycemia: Secondary | ICD-10-CM | POA: Insufficient documentation

## 2020-02-15 LAB — BASIC METABOLIC PANEL
Anion gap: 10 (ref 5–15)
BUN: 17 mg/dL (ref 6–20)
CO2: 23 mmol/L (ref 22–32)
Calcium: 9.1 mg/dL (ref 8.9–10.3)
Chloride: 99 mmol/L (ref 98–111)
Creatinine, Ser: 0.85 mg/dL (ref 0.44–1.00)
GFR calc Af Amer: >60 mL/min (ref >60)
GFR calc non Af Amer: >60 mL/min (ref >60)
Glucose, Bld: 321 mg/dL — ABNORMAL HIGH (ref 70–99)
Potassium: 4.3 mmol/L (ref 3.5–5.1)
Sodium: 132 mmol/L — ABNORMAL LOW (ref 135–145)

## 2020-02-15 LAB — URINALYSIS, COMPLETE (UACMP) WITH MICROSCOPIC
Bacteria, UA: NONE SEEN
Bilirubin Urine: NEGATIVE
Glucose, UA: >=500 mg/dL — AB
Hgb urine dipstick: NEGATIVE
Ketones, ur: 20 mg/dL — AB
Leukocytes,Ua: NEGATIVE
Nitrite: NEGATIVE
Protein, ur: NEGATIVE mg/dL
Specific Gravity, Urine: 1.032 — ABNORMAL HIGH (ref 1.005–1.030)
pH: 5 (ref 5.0–8.0)

## 2020-02-15 LAB — CBC
HCT: 39.7 % (ref 36.0–46.0)
Hemoglobin: 13.2 g/dL (ref 12.0–15.0)
MCH: 29.1 pg (ref 26.0–34.0)
MCHC: 33.2 g/dL (ref 30.0–36.0)
MCV: 87.4 fL (ref 80.0–100.0)
Platelets: 251 10*3/uL (ref 150–400)
RBC: 4.54 MIL/uL (ref 3.87–5.11)
RDW: 13.8 % (ref 11.5–15.5)
WBC: 5.5 10*3/uL (ref 4.0–10.5)
nRBC: 0 % (ref 0.0–0.2)

## 2020-02-15 LAB — GLUCOSE, CAPILLARY
Glucose-Capillary: 297 mg/dL — ABNORMAL HIGH (ref 70–99)
Glucose-Capillary: 447 mg/dL — ABNORMAL HIGH (ref 70–99)
Glucose-Capillary: 273 mg/dL — ABNORMAL HIGH (ref 70–99)

## 2020-02-15 LAB — POCT PREGNANCY, URINE: Preg Test, Ur: NEGATIVE

## 2020-02-15 MED ORDER — CYCLOBENZAPRINE HCL 10 MG PO TABS: 10.0000 mg | ORAL_TABLET | Freq: Three times a day (TID) | ORAL | 0 refills | Status: DC | PRN

## 2020-02-15 MED ORDER — SODIUM CHLORIDE 0.9 % IV BOLUS
1000.0000 mL | Freq: Once | INTRAVENOUS | Status: AC
  Administered 2020-02-15: 1000 mL via INTRAVENOUS

## 2020-02-15 MED ORDER — LIDOCAINE 5 % EX PTCH: 1.0000 | MEDICATED_PATCH | Freq: Two times a day (BID) | CUTANEOUS | 0 refills | Status: AC

## 2020-02-15 MED ORDER — INSULIN ASPART 100 UNIT/ML ~~LOC~~ SOLN
10.0000 [IU] | Freq: Once | SUBCUTANEOUS | Status: AC
  Administered 2020-02-15: 10 [IU] via INTRAVENOUS
  Filled 2020-02-15: qty 1

## 2020-02-15 MED ORDER — TRAMADOL HCL 50 MG PO TABS
50.0000 mg | ORAL_TABLET | Freq: Once | ORAL | Status: AC
  Administered 2020-02-15: 50 mg via ORAL
  Filled 2020-02-15: qty 1

## 2020-02-15 MED ORDER — ACETAMINOPHEN 325 MG PO TABS
650.0000 mg | ORAL_TABLET | Freq: Once | ORAL | Status: AC
  Administered 2020-02-15: 650 mg via ORAL
  Filled 2020-02-15: qty 2

## 2020-02-15 MED ORDER — TRAMADOL HCL 50 MG PO TABS: 50.0000 mg | ORAL_TABLET | Freq: Two times a day (BID) | ORAL | 0 refills | Status: DC | PRN

## 2020-02-15 MED ORDER — ONDANSETRON HCL 4 MG/2ML IJ SOLN
4.0000 mg | Freq: Once | INTRAMUSCULAR | Status: AC
  Administered 2020-02-15: 4 mg via INTRAVENOUS
  Filled 2020-02-15: qty 2

## 2020-02-15 MED ORDER — CYCLOBENZAPRINE HCL 10 MG PO TABS
10.0000 mg | ORAL_TABLET | Freq: Once | ORAL | Status: AC
  Administered 2020-02-15: 10 mg via ORAL
  Filled 2020-02-15: qty 1

## 2020-02-15 MED ORDER — IBUPROFEN 600 MG PO TABS
600.0000 mg | ORAL_TABLET | Freq: Once | ORAL | Status: DC
  Filled 2020-02-15: qty 1

## 2020-02-15 NOTE — ED Notes (Signed)
bg 273

## 2020-02-15 NOTE — ED Notes (Signed)
First Nurse Note: Pt to ED via POV c/o intermittent swelling in her left arm, N/V/D, and tingling in both her hands. Pt is also having pain in both arms, pt states that her pain radiated into her shoulder. Pt states that she has been having symptoms since she got her first COVID vaccine in March. Pt is in NAD.

## 2020-02-15 NOTE — ED Provider Notes (Signed)
[facility] Emergency Department Provider Note   ____________________________________________   First MD Initiated Contact with Patient 02/15/20 1107     (approximate)  I have reviewed the triage vital signs and the nursing notes.   HISTORY  Chief Complaint Arm Pain, Vaccination Reaction, and Weakness    HPI Jody Chavez is a 38 y.o. female patient complain of bilateral upper arm numbness status post COVID-19 injections.  Patient received first injection 2 weeks ago and started noticing tingling and numbness to the left upper extremity.  2 days ago patient received second COVID-19 injection in the right arm resulting in a hematoma and swelling/tingling right arm.  Patient also complained of nausea and diarrhea.  Patient states fever.  Patient states her diabetes has been difficult to control after taking the initial COVID-19 injection.         Past Medical History:  Diagnosis Date  . Anxiety   . Depressed   . Diabetes mellitus without complication (HCC)   . Ectopic pregnancy   . Hypertension   . IBS (irritable bowel syndrome)   . Paroxysmal SVT (supraventricular tachycardia) (HCC) WPW  . Wolff-Parkinson-White (WPW) syndrome     Patient Active Problem List   Diagnosis Date Noted  . DKA (diabetic ketoacidoses) (HCC) 11/01/2018  . Generalized abdominal pain 04/06/2017  . DKA (diabetic ketoacidosis) (HCC) 11/03/2016  . Difficulty in swallowing 11/20/2015    Past Surgical History:  Procedure Laterality Date  . ABDOMINAL SURGERY    . CARDIAC ELECTROPHYSIOLOGY MAPPING AND ABLATION    . CESAREAN SECTION    . CHOLECYSTECTOMY    . DIAGNOSTIC LAPAROSCOPY WITH REMOVAL OF ECTOPIC PREGNANCY Right 03/04/2016   Procedure: DIAGNOSTIC LAPAROSCOPY WITH right salpingectomy and REMOVAL OF ECTOPIC PREGNANCY, extensive lysis of adhesions;  Surgeon: Suzy Bouchard, MD;  Location: [facility] ORS;  Service: Gynecology;  Laterality: Right;  . ENDOMETRIAL ABLATION     . HEART ABLATION      Prior to Admission medications   Medication Sig Start Date End Date Taking? Authorizing Provider  ARIPiprazole (ABILIFY) 10 MG tablet Take 10 mg by mouth daily.    [provider]  cyclobenzaprine (FLEXERIL) 10 MG tablet Take 1 tablet (10 mg total) by mouth 3 (three) times daily as needed. 02/15/20   Joni Reining, PA-C  escitalopram (LEXAPRO) 20 MG tablet Take 20 mg by mouth daily.    [provider]  ibuprofen (ADVIL,MOTRIN) 600 MG tablet Take 1 tablet (600 mg total) by mouth every 6 (six) hours as needed. 11/24/18   Domenick Gong, MD  Insulin Aspart (NOVOLOG FLEXPEN Table [place]) Inject into the skin.    [provider]  insulin detemir (LEVEMIR) 100 UNIT/ML injection Inject 36 Units into the skin daily.    [provider]  Insulin Syringe-Needle U-100 (INSULIN SYRINGE .3CC/31GX5/16") 31G X 5/16" 0.3 ML MISC 5 times daily as directed. 11/04/16   Houston Siren, MD  levofloxacin (LEVAQUIN) 500 MG tablet Take 1 tablet (500 mg total) by mouth daily. 05/29/19   Larene Pickett, FNP  lidocaine (LIDODERM) 5 % Place 1 patch onto the skin every 12 (twelve) hours. Remove & Discard patch within 12 hours or as directed by MD 02/15/20 02/14/21  Joni Reining, PA-C  lisinopril (PRINIVIL,ZESTRIL) 40 MG tablet Take 1 tablet (40 mg total) by mouth daily. 11/02/18   Auburn Bilberry, MD  mupirocin ointment Idelle Jo) 2 % Apply to affected area 3 times daily 07/20/19 07/19/20  Emily Filbert, MD  Polyethylene Glycol 3350 (  PEG 3350) POWD Take 17 g by mouth 2 (two) times daily as needed. 02/13/18   [provider]  traMADol (ULTRAM) 50 MG tablet Take 1 tablet (50 mg total) by mouth every 12 (twelve) hours as needed. 02/15/20   Joni Reining, PA-C    Allergies Iodine, Shellfish allergy, and Penicillins  Family History  Problem Relation Age of Onset  . Gallbladder disease Mother     Social History Social History   Tobacco Use  . Smoking  status: Current Every Day Smoker    Packs/day: 1.00    Types: Cigarettes  . Smokeless tobacco: Never Used  Substance Use Topics  . Alcohol use: Yes    Comment: socially  . Drug use: No    Review of Systems Constitutional: No fever/chills Eyes: No visual changes. ENT: No sore throat. Cardiovascular: Denies chest pain.  History of WPW. Respiratory: Denies shortness of breath. Gastrointestinal: No abdominal pain.  No nausea, no vomiting.  No diarrhea.  No constipation. Genitourinary: Negative for dysuria. Musculoskeletal: Negative for back pain. Skin: Negative for rash. Neurological: Negative for headaches, focal weakness or numbness. Psychiatric:  Anxiety depression. Endocrine:  Diabetes and hypertension. Allergic/Immunilogical: Iodine, shellfish, and penicillin. ____________________________________________   PHYSICAL EXAM:  VITAL SIGNS: ED Triage Vitals  Enc Vitals Group     BP 02/15/20 1020 (!) 146/102     Pulse Rate 02/15/20 1020 (!) 101     Resp 02/15/20 1020 18     Temp 02/15/20 1020 98.4 F (36.9 C)     Temp Source 02/15/20 1020 Oral     SpO2 02/15/20 1020 98 %     Weight 02/15/20 1017 170 lb (77.1 kg)     Height 02/15/20 1017 5\' 3"  (1.6 m)     Head Circumference --      Peak Flow --      Pain Score 02/15/20 1017 7     Pain Loc --      Pain Edu? --      Excl. in GC? --     Constitutional: Alert and oriented. Well appearing and in no acute distress.  Afebrile. Neck: No stridor.  No cervical spine tenderness to palpation. Hematological/Lymphatic/Immunilogical: No cervical lymphadenopathy. Cardiovascular: Normal rate, regular rhythm. Grossly normal heart sounds.  Good peripheral circulation.  Elevated diastolic BP Respiratory: Normal respiratory effort.  No retractions. Lungs CTAB. Gastrointestinal: Soft and nontender. No distention. No abdominal bruits. No CVA tenderness. Genitourinary: Deferred Musculoskeletal: No lower extremity tenderness nor edema.  No  joint effusions. Neurologic:  Normal speech and language. No gross focal neurologic deficits are appreciated. No gait instability. Skin:  Skin is warm, dry and intact. No rash noted.  Edema and erythema to the right upper extremity.  Right upper arm is measuring 31 cm versus 29 cm of the left upper arm. Psychiatric: Mood and affect are normal. Speech and behavior are normal.  ____________________________________________   LABS (all labs ordered are listed, but only abnormal results are displayed)  Labs Reviewed  BASIC METABOLIC PANEL - Abnormal; Notable for the following components:      Result Value   Sodium 132 (*)    Glucose, Bld 321 (*)    All other components within normal limits  URINALYSIS, COMPLETE (UACMP) WITH MICROSCOPIC - Abnormal; Notable for the following components:   Color, Urine YELLOW (*)    APPearance CLEAR (*)    Specific Gravity, Urine 1.032 (*)    Glucose, UA >=500 (*)    Ketones, ur 20 (*)  All other components within normal limits  GLUCOSE, CAPILLARY - Abnormal; Notable for the following components:   Glucose-Capillary 297 (*)    All other components within normal limits  GLUCOSE, CAPILLARY - Abnormal; Notable for the following components:   Glucose-Capillary 447 (*)    All other components within normal limits  GLUCOSE, CAPILLARY - Abnormal; Notable for the following components:   Glucose-Capillary 273 (*)    All other components within normal limits  CBC  POC URINE PREG, ED  CBG MONITORING, ED  POCT PREGNANCY, URINE  CBG MONITORING, ED   ____________________________________________  EKG  Read by heart station Dr. With no acute findings. ____________________________________________  RADIOLOGY  ED MD interpretation:    Official radiology report(s): DG Cervical Spine 2-3 Views  Result Date: 02/15/2020 CLINICAL DATA:  RIGHT shoulder pain radiating to neck. EXAM: CERVICAL SPINE - 2-3 VIEW COMPARISON:  None. FINDINGS: Dextroscoliosis, mild to  moderate in degree, possibly accentuated by patient positioning. Slight reversal of the normal cervical spine lordosis is likely related to patient positioning or muscle spasm. No evidence of acute vertebral body subluxation. No acute or suspicious osseous finding. Disc spaces are well preserved. Prevertebral soft tissues are normal in thickness. IMPRESSION: 1. No acute findings. 2. Dextroscoliosis, mild to moderate in degree, possibly accentuated by patient positioning. 3. Slight reversal of the normal cervical spine lordosis is likely related to patient positioning or muscle spasm. Electronically Signed   By: Franki Cabot M.D.   On: 02/15/2020 13:05   US Venous Img Upper Uni Right(DVT)  Result Date: 02/15/2020 CLINICAL DATA:  Swelling at COVID vaccination injection site and right upper extremity. EXAM: RIGHT UPPER EXTREMITY VENOUS DOPPLER ULTRASOUND TECHNIQUE: Gray-scale sonography with graded compression, as well as color Doppler and duplex ultrasound were performed to evaluate the upper extremity deep venous system from the level of the subclavian vein and including the jugular, axillary, basilic, radial, ulnar and upper cephalic vein. Spectral Doppler was utilized to evaluate flow at rest and with distal augmentation maneuvers. COMPARISON:  None. FINDINGS: Contralateral Subclavian Vein: Respiratory phasicity is normal and symmetric with the symptomatic side. No evidence of thrombus. Normal compressibility. Internal Jugular Vein: No evidence of thrombus. Normal compressibility, respiratory phasicity and response to augmentation. Subclavian Vein: No evidence of thrombus. Normal compressibility, respiratory phasicity and response to augmentation. Axillary Vein: No evidence of thrombus. Normal compressibility, respiratory phasicity and response to augmentation. Cephalic Vein: No evidence of thrombus. Normal compressibility, respiratory phasicity and response to augmentation. Basilic Vein: No evidence of  thrombus. Normal compressibility, respiratory phasicity and response to augmentation. Brachial Veins: No evidence of thrombus. Normal compressibility, respiratory phasicity and response to augmentation. Radial Veins: No evidence of thrombus. Normal compressibility, respiratory phasicity and response to augmentation. Ulnar Veins: No evidence of thrombus. Normal compressibility, respiratory phasicity and response to augmentation. Venous Reflux:  None visualized. Other Findings:  None visualized. IMPRESSION: No evidence of DVT within the right upper extremity. Electronically Signed   By: Ilona Sorrel M.D.   On: 02/15/2020 12:28    ____________________________________________   PROCEDURES  Procedure(s) performed (including Critical Care):  Procedures   ____________________________________________   INITIAL IMPRESSION / ASSESSMENT AND PLAN / ED COURSE  As part of my medical decision making, I reviewed the following data within the [place]     Patient presents with hematoma secondary to COVID-19 injection a few days ago.  It was also noticed the patient glucose was elevated even though she took insulin prior to arrival.  Glucose  was decreased with IV hydration and 10 units of insulin.  Patient given discharge care instruction and a work note.  Patient advised to follow-up with PCP if complaint persist.          ____________________________________________   FINAL CLINICAL IMPRESSION(S) / ED DIAGNOSES  Final diagnoses:  Swelling at injection site  Elevated serum glucose     ED Discharge Orders         Ordered    lidocaine (LIDODERM) 5 %  Every 12 hours     02/15/20 1446    traMADol (ULTRAM) 50 MG tablet  Every 12 hours PRN     02/15/20 1446    cyclobenzaprine (FLEXERIL) 10 MG tablet  3 times daily PRN     02/15/20 1446           Note:  This document was prepared using Dragon voice recognition software and may include unintentional dictation  errors.     Joni Reining, PA-C 02/15/20 1450    Minna Antis, MD 02/15/20 [phone]

## 2020-02-15 NOTE — ED Notes (Signed)
Pt encouraged to follow up with Endocrinologist for her type one diabetes and blood sugar control.

## 2020-02-15 NOTE — Discharge Instructions (Signed)
You have a hematoma to the right upper arm secondary to an injection.  Follow discharge care instructions.  Medication may cause drowsiness.

## 2020-02-15 NOTE — ED Triage Notes (Signed)
PT arrived via POV with c/o having complications from COVID vaccination, Pt had second dose of COVID vaccine on Thursday.  Pt c/o pain to right arm, pt also states her left arm and left hand are swelling as well.  Pt also c/o nausea and diarrhea. Pt reports fever as well.    Pt states she had reaction since March 25 with hyperglycemia. Pt states Type 1 diabetic.

## 2020-02-28 ENCOUNTER — Emergency Department
Admission: EM | Admit: 2020-02-28 | Discharge: 2020-02-28 | Disposition: A | Discharge status: Home / Self Care | Payer: Self-pay | Attending: Emergency Medicine | Admitting: Emergency Medicine

## 2020-02-28 ENCOUNTER — Other Ambulatory Visit: Payer: Self-pay

## 2020-02-28 ENCOUNTER — Emergency Department: Payer: Self-pay

## 2020-02-28 DIAGNOSIS — I10 Essential (primary) hypertension: Secondary | ICD-10-CM | POA: Insufficient documentation

## 2020-02-28 DIAGNOSIS — R531 Weakness: Secondary | ICD-10-CM | POA: Insufficient documentation

## 2020-02-28 DIAGNOSIS — E1165 Type 2 diabetes mellitus with hyperglycemia: Secondary | ICD-10-CM | POA: Insufficient documentation

## 2020-02-28 DIAGNOSIS — E860 Dehydration: Secondary | ICD-10-CM | POA: Insufficient documentation

## 2020-02-28 DIAGNOSIS — R2232 Localized swelling, mass and lump, left upper limb: Secondary | ICD-10-CM | POA: Insufficient documentation

## 2020-02-28 DIAGNOSIS — M5412 Radiculopathy, cervical region: Secondary | ICD-10-CM | POA: Insufficient documentation

## 2020-02-28 DIAGNOSIS — Z20822 Contact with and (suspected) exposure to covid-19: Secondary | ICD-10-CM | POA: Insufficient documentation

## 2020-02-28 DIAGNOSIS — Z794 Long term (current) use of insulin: Secondary | ICD-10-CM | POA: Insufficient documentation

## 2020-02-28 DIAGNOSIS — R739 Hyperglycemia, unspecified: Secondary | ICD-10-CM

## 2020-02-28 DIAGNOSIS — Z79899 Other long term (current) drug therapy: Secondary | ICD-10-CM | POA: Insufficient documentation

## 2020-02-28 DIAGNOSIS — M79601 Pain in right arm: Secondary | ICD-10-CM | POA: Insufficient documentation

## 2020-02-28 LAB — URINALYSIS, COMPLETE (UACMP) WITH MICROSCOPIC
Bacteria, UA: NONE SEEN
Bilirubin Urine: NEGATIVE
Glucose, UA: >=500 mg/dL — AB
Ketones, ur: 20 mg/dL — AB
Leukocytes,Ua: NEGATIVE
Nitrite: NEGATIVE
Protein, ur: NEGATIVE mg/dL
Specific Gravity, Urine: 1.02 (ref 1.005–1.030)
pH: 5 (ref 5.0–8.0)

## 2020-02-28 LAB — PREGNANCY, URINE: Preg Test, Ur: NEGATIVE

## 2020-02-28 LAB — BASIC METABOLIC PANEL
Anion gap: 10 (ref 5–15)
BUN: 19 mg/dL (ref 6–20)
CO2: 25 mmol/L (ref 22–32)
Calcium: 9.2 mg/dL (ref 8.9–10.3)
Chloride: 99 mmol/L (ref 98–111)
Creatinine, Ser: 0.99 mg/dL (ref 0.44–1.00)
GFR calc Af Amer: >60 mL/min (ref >60)
GFR calc non Af Amer: >60 mL/min (ref >60)
Glucose, Bld: 359 mg/dL — ABNORMAL HIGH (ref 70–99)
Potassium: 4.8 mmol/L (ref 3.5–5.1)
Sodium: 134 mmol/L — ABNORMAL LOW (ref 135–145)

## 2020-02-28 LAB — TSH: TSH: 0.822 u[IU]/mL (ref 0.350–4.500)

## 2020-02-28 LAB — RESPIRATORY PANEL BY RT PCR (FLU A&B, COVID)
Influenza A by PCR: NEGATIVE
Influenza B by PCR: NEGATIVE
SARS Coronavirus 2 by RT PCR: NEGATIVE

## 2020-02-28 LAB — CBC
HCT: 40.3 % (ref 36.0–46.0)
Hemoglobin: 13.3 g/dL (ref 12.0–15.0)
MCH: 29 pg (ref 26.0–34.0)
MCHC: 33 g/dL (ref 30.0–36.0)
MCV: 87.8 fL (ref 80.0–100.0)
Platelets: 292 10*3/uL (ref 150–400)
RBC: 4.59 MIL/uL (ref 3.87–5.11)
RDW: 13.6 % (ref 11.5–15.5)
WBC: 8.5 10*3/uL (ref 4.0–10.5)
nRBC: 0 % (ref 0.0–0.2)

## 2020-02-28 LAB — GLUCOSE, CAPILLARY
Glucose-Capillary: 304 mg/dL — ABNORMAL HIGH (ref 70–99)
Glucose-Capillary: 452 mg/dL — ABNORMAL HIGH (ref 70–99)
Glucose-Capillary: 438 mg/dL — ABNORMAL HIGH (ref 70–99)
Glucose-Capillary: 287 mg/dL — ABNORMAL HIGH (ref 70–99)

## 2020-02-28 LAB — T4, FREE: Free T4: 1.01 ng/dL (ref 0.61–1.12)

## 2020-02-28 MED ORDER — ONDANSETRON HCL 4 MG/2ML IJ SOLN
4.0000 mg | Freq: Once | INTRAMUSCULAR | Status: AC
  Administered 2020-02-28: 05:00:00 4 mg via INTRAVENOUS
  Filled 2020-02-28: qty 2

## 2020-02-28 MED ORDER — SODIUM CHLORIDE 0.9 % IV BOLUS
1000.0000 mL | Freq: Once | INTRAVENOUS | Status: AC
  Administered 2020-02-28: 1000 mL via INTRAVENOUS

## 2020-02-28 MED ORDER — SODIUM CHLORIDE 0.9 % IV BOLUS
1000.0000 mL | Freq: Once | INTRAVENOUS | Status: AC
  Administered 2020-02-28: 06:00:00 1000 mL via INTRAVENOUS

## 2020-02-28 MED ORDER — OXYCODONE-ACETAMINOPHEN 5-325 MG PO TABS: 1.0000 | ORAL_TABLET | Freq: Three times a day (TID) | ORAL | 0 refills | Status: DC | PRN

## 2020-02-28 MED ORDER — INSULIN ASPART 100 UNIT/ML ~~LOC~~ SOLN
8.0000 [IU] | Freq: Once | SUBCUTANEOUS | Status: AC
  Administered 2020-02-28: 8 [IU] via SUBCUTANEOUS
  Filled 2020-02-28: qty 1

## 2020-02-28 NOTE — ED Notes (Signed)
E-signature not working at this time. Pt verbalized understanding of D/C instructions, prescriptions and follow up care with no further questions at this time. Pt in NAD and ambulatory at time of D/C.  

## 2020-02-28 NOTE — ED Notes (Signed)
Pt reports lethargy for approx the same amount of time that her blood sugars have been elevated, approx 2 weeks

## 2020-02-28 NOTE — ED Triage Notes (Signed)
PT to ED via POV c/o left hand swelling and "lightning bolt" pain in right arm. PT received her second covid vaccine in April and states these problems started after that. PT also states her sugars have been in the 500's for a couple weeks. States she has been lethargic recently.  Seen recently for the same.

## 2020-02-28 NOTE — ED Provider Notes (Signed)
Patient with some cervical radiculopathy on MRI.  She will be placed on light duty but no heavy lifting.  Is also having significant adverse reaction from the vaccine.  She be discharged with pain medicine and referred to neurosurgery for outpatient follow-up.   Emily Filbert, MD 02/28/20 [phone]

## 2020-02-28 NOTE — Discharge Instructions (Signed)
Continue to take your medicines daily as directed by your doctor.  Drink plenty of fluids daily.  Return to the ER for worsening symptoms, persistent vomiting, difficulty breathing or other concerns. 

## 2020-02-28 NOTE — ED Notes (Signed)
Pt to MRI

## 2020-02-28 NOTE — ED Provider Notes (Signed)
[facility] Emergency Department Provider Note   ____________________________________________   First MD Initiated Contact with Patient 02/28/20 0400     (approximate)  I have reviewed the triage vital signs and the nursing notes.   HISTORY  Chief Complaint Weakness, Hand Swelling, and Hyperglycemia    HPI Jody Chavez is a 39 y.o. female who presents to the ED from home with a chief complaint of sharp electrical pain in her right arm and left hand swelling.  This is the second time that she has presented to the ED for this evaluation.  She was seen on 4/17 for same with unremarkable RUE ultrasound for DVT and negative C-spine x-rays.  Patient reports symptoms began after her second dose of the Pfizer COVID-19 vaccination on 4/15.  At the time of her 4/17 ED visit she was found to have right arm hematoma and no appreciable left hand swelling.  Patient does say that her left hand swelling is intermittent.  She is currently not experiencing swelling.  Also mentions that her blood sugars have been high since her second Covid vaccine.  Usually runs 200s or less but lately she has been running in the 5-600 range.  She took insulin prior to arrival and states she became hypoglycemic because she has not been eating so she ate something.  Endorses generalized fatigue.  Denies fever, cough, chest pain, shortness of breath, abdominal pain, nausea or vomiting.  Denies extremity weakness.  Denies recent travel or trauma.       Past Medical History:  Diagnosis Date  . Anxiety   . Depressed   . Diabetes mellitus without complication (HCC)   . Ectopic pregnancy   . Hypertension   . IBS (irritable bowel syndrome)   . Paroxysmal SVT (supraventricular tachycardia) (HCC) WPW  . Wolff-Parkinson-White (WPW) syndrome     Patient Active Problem List   Diagnosis Date Noted  . DKA (diabetic ketoacidoses) (HCC) 11/01/2018  . Generalized abdominal pain 04/06/2017  . DKA  (diabetic ketoacidosis) (HCC) 11/03/2016  . Difficulty in swallowing 11/20/2015    Past Surgical History:  Procedure Laterality Date  . ABDOMINAL SURGERY    . CARDIAC ELECTROPHYSIOLOGY MAPPING AND ABLATION    . CESAREAN SECTION    . CHOLECYSTECTOMY    . DIAGNOSTIC LAPAROSCOPY WITH REMOVAL OF ECTOPIC PREGNANCY Right 03/04/2016   Procedure: DIAGNOSTIC LAPAROSCOPY WITH right salpingectomy and REMOVAL OF ECTOPIC PREGNANCY, extensive lysis of adhesions;  Surgeon: Suzy Bouchard, MD;  Location: [facility] ORS;  Service: Gynecology;  Laterality: Right;  . ENDOMETRIAL ABLATION    . HEART ABLATION      Prior to Admission medications   Medication Sig Start Date End Date Taking? Authorizing Provider  ARIPiprazole (ABILIFY) 10 MG tablet Take 10 mg by mouth daily.    [provider]  cyclobenzaprine (FLEXERIL) 10 MG tablet Take 1 tablet (10 mg total) by mouth 3 (three) times daily as needed. 02/15/20   Joni Reining, PA-C  escitalopram (LEXAPRO) 20 MG tablet Take 20 mg by mouth daily.    [provider]  ibuprofen (ADVIL,MOTRIN) 600 MG tablet Take 1 tablet (600 mg total) by mouth every 6 (six) hours as needed. 11/24/18   Domenick Gong, MD  Insulin Aspart (NOVOLOG FLEXPEN [place]) Inject into the skin.    [provider]  insulin detemir (LEVEMIR) 100 UNIT/ML injection Inject 36 Units into the skin daily.    [provider]  Insulin Syringe-Needle U-100 (INSULIN SYRINGE .3CC/31GX5/16") 31G X 5/16" 0.3 ML  MISC 5 times daily as directed. 11/04/16   Houston Siren, MD  levofloxacin (LEVAQUIN) 500 MG tablet Take 1 tablet (500 mg total) by mouth daily. 05/29/19   Larene Pickett, FNP  lidocaine (LIDODERM) 5 % Place 1 patch onto the skin every 12 (twelve) hours. Remove & Discard patch within 12 hours or as directed by MD 02/15/20 02/14/21  Joni Reining, PA-C  lisinopril (PRINIVIL,ZESTRIL) 40 MG tablet Take 1 tablet (40 mg total) by mouth daily. 11/02/18   Auburn Bilberry, MD    mupirocin ointment Idelle Jo) 2 % Apply to affected area 3 times daily 07/20/19 07/19/20  Emily Filbert, MD  Polyethylene Glycol 3350 (PEG 3350) POWD Take 17 g by mouth 2 (two) times daily as needed. 02/13/18   [provider]  traMADol (ULTRAM) 50 MG tablet Take 1 tablet (50 mg total) by mouth every 12 (twelve) hours as needed. 02/15/20   Joni Reining, PA-C    Allergies Iodine, Shellfish allergy, and Penicillins  Family History  Problem Relation Age of Onset  . Gallbladder disease Mother     Social History Social History   Tobacco Use  . Smoking status: Current Every Day Smoker    Packs/day: 1.00    Types: Cigarettes  . Smokeless tobacco: Never Used  Substance Use Topics  . Alcohol use: Yes    Comment: socially  . Drug use: No    Review of Systems  Constitutional: Positive for fatigue.  Positive for hyperglycemia.  No fever/chills Eyes: No visual changes. ENT: No sore throat. Cardiovascular: Denies chest pain. Respiratory: Denies shortness of breath. Gastrointestinal: No abdominal pain.  No nausea, no vomiting.  No diarrhea.  No constipation. Genitourinary: Negative for dysuria. Musculoskeletal: Positive for right arm radiculopathy.  Positive for left hand swelling.  Negative for back pain. Skin: Negative for rash. Neurological: Negative for headaches, focal weakness or numbness.   ____________________________________________   PHYSICAL EXAM:  VITAL SIGNS: ED Triage Vitals  Enc Vitals Group     BP 02/28/20 0102 (!) 143/118     Pulse Rate 02/28/20 0102 (!) 111     Resp 02/28/20 0102 20     Temp 02/28/20 0102 98 F (36.7 C)     Temp Source 02/28/20 0102 Oral     SpO2 02/28/20 0102 100 %     Weight 02/28/20 0103 170 lb (77.1 kg)     Height 02/28/20 0103 5\' 3"  (1.6 m)     Head Circumference --      Peak Flow --      Pain Score 02/28/20 0103 8     Pain Loc --      Pain Edu? --      Excl. in GC? --     Constitutional: Alert and oriented.  Well appearing and in no acute distress. Eyes: Conjunctivae are normal. PERRL. EOMI. Head: Atraumatic. Nose: No congestion/rhinnorhea. Mouth/Throat: Mucous membranes are moist.   Neck: No stridor.  No cervical spine tenderness to palpation. Cardiovascular: Normal rate, regular rhythm. Grossly normal heart sounds.  Good peripheral circulation. Respiratory: Normal respiratory effort.  No retractions. Lungs CTAB. Gastrointestinal: Soft and nontender. No distention. No abdominal bruits. No CVA tenderness. Musculoskeletal:  RUE: No appreciable swelling or hematoma to the right arm.  Full range of motion shoulder, elbow and wrist without pain.  2+ radial pulse.  Brisk, less than 5-second capillary refill. Left hand: No appreciable swelling compared to the right.  Symmetrically warm hand without evidence for ischemia.  2+ radial pulse.  Brisk, less than 5-second capillary refill. No lower extremity tenderness nor edema.  No joint effusions. Neurologic:  Normal speech and language. No gross focal neurologic deficits are appreciated.  5/5 motor strength and sensation all extremities.  No gait instability. Skin:  Skin is warm, dry and intact. No rash noted. Psychiatric: Mood and affect are normal. Speech and behavior are normal.  ____________________________________________   LABS (all labs ordered are listed, but only abnormal results are displayed)  Labs Reviewed  BASIC METABOLIC PANEL - Abnormal; Notable for the following components:      Result Value   Sodium 134 (*)    Glucose, Bld 359 (*)    All other components within normal limits  URINALYSIS, COMPLETE (UACMP) WITH MICROSCOPIC - Abnormal; Notable for the following components:   Color, Urine STRAW (*)    APPearance CLEAR (*)    Glucose, UA >=500 (*)    Hgb urine dipstick SMALL (*)    Ketones, ur 20 (*)    All other components within normal limits  GLUCOSE, CAPILLARY - Abnormal; Notable for the following components:    Glucose-Capillary 304 (*)    All other components within normal limits  GLUCOSE, CAPILLARY - Abnormal; Notable for the following components:   Glucose-Capillary 452 (*)    All other components within normal limits  GLUCOSE, CAPILLARY - Abnormal; Notable for the following components:   Glucose-Capillary 438 (*)    All other components within normal limits  CBC  TSH  T4, FREE  PREGNANCY, URINE  CBG MONITORING, ED  CBG MONITORING, ED   ____________________________________________  EKG  None ____________________________________________  RADIOLOGY  ED MD interpretation: Pending  Official radiology report(s): No results found.  ____________________________________________   PROCEDURES  Procedure(s) performed (including Critical Care):  Procedures   ____________________________________________   INITIAL IMPRESSION / ASSESSMENT AND PLAN / ED COURSE  As part of my medical decision making, I reviewed the following data within the electronic MEDICAL RECORD NUMBER Nursing notes reviewed and incorporated, Labs reviewed, Old chart reviewed, Radiograph reviewed and Notes from prior ED visits     Mitra Duling was evaluated in Emergency Department on 02/28/2020 for the symptoms described in the history of present illness. She was evaluated in the context of the global COVID-19 pandemic, which necessitated consideration that the patient might be at risk for infection with the SARS-CoV-2 virus that causes COVID-19. Institutional protocols and algorithms that pertain to the evaluation of patients at risk for COVID-19 are in a state of rapid change based on information released by regulatory bodies including the CDC and federal and state organizations. These policies and algorithms were followed during the patient's care in the ED.    39 year old female presenting with fatigue, left hand swelling, right arm radiculopathy and hyperglycemia after receiving second COVID-19 vaccination on  02/13/2020.  Differential diagnosis includes but is not limited to vaccine reaction, metabolic, infectious etiologies, etc.  Lab work unremarkable thus far.  Will check thyroid panel.  Right arm is not swollen; low suspicion for DVT.  Given symptoms suspicious for radiculopathy, will obtain MRI cervical spine.  Blood sugar recheck in the 400s; will administer IV fluids and recheck.   Clinical Course as of Feb 27 654  Fri Feb 28, 2020  6333 Currently in MRI.  Care transferred to Dr. Mayford Knife at change of shift pending MRI results and blood sugar recheck.   [JS]    Clinical Course User Index [JS] Irean Hong, MD     ____________________________________________   FINAL  CLINICAL IMPRESSION(S) / ED DIAGNOSES  Final diagnoses:  Weakness  Hyperglycemia  Cervical radiculopathy  Dehydration     ED Discharge Orders    None       Note:  This document was prepared using Dragon voice recognition software and may include unintentional dictation errors.   Paulette Blanch, MD 02/28/20 [phone]

## 2020-06-09 ENCOUNTER — Emergency Department
Admission: EM | Admit: 2020-06-09 | Discharge: 2020-06-09 | Discharge status: Against Medical Advice | Payer: Medicaid Other

## 2020-07-02 ENCOUNTER — Other Ambulatory Visit: Payer: Self-pay

## 2020-07-02 ENCOUNTER — Encounter: Payer: Self-pay | Admitting: *Deleted

## 2020-07-02 DIAGNOSIS — Z5321 Procedure and treatment not carried out due to patient leaving prior to being seen by health care provider: Secondary | ICD-10-CM | POA: Diagnosis not present

## 2020-07-02 DIAGNOSIS — R2231 Localized swelling, mass and lump, right upper limb: Secondary | ICD-10-CM | POA: Diagnosis not present

## 2020-07-02 DIAGNOSIS — M546 Pain in thoracic spine: Secondary | ICD-10-CM | POA: Insufficient documentation

## 2020-07-02 NOTE — ED Triage Notes (Addendum)
Pt ambulatory to triage.  Pt has upper back pain with swelling and tingling in hands.  Sx for months.  Hx herniated disc.  Mri 4/21.  Pt alert  Speech clear.

## 2020-07-03 ENCOUNTER — Emergency Department
Admission: EM | Admit: 2020-07-03 | Discharge: 2020-07-03 | Disposition: A | Discharge status: Home / Self Care | Payer: BC Managed Care – PPO | Attending: Emergency Medicine | Admitting: Emergency Medicine

## 2020-07-03 NOTE — ED Notes (Signed)
No answer when called several times from lobby 

## 2020-07-04 ENCOUNTER — Emergency Department
Admission: EM | Admit: 2020-07-04 | Discharge: 2020-07-04 | Disposition: A | Discharge status: Home / Self Care | Payer: BC Managed Care – PPO | Attending: Emergency Medicine | Admitting: Emergency Medicine

## 2020-07-04 ENCOUNTER — Other Ambulatory Visit: Payer: Self-pay

## 2020-07-04 DIAGNOSIS — F17210 Nicotine dependence, cigarettes, uncomplicated: Secondary | ICD-10-CM | POA: Insufficient documentation

## 2020-07-04 DIAGNOSIS — M5412 Radiculopathy, cervical region: Secondary | ICD-10-CM | POA: Insufficient documentation

## 2020-07-04 DIAGNOSIS — Z794 Long term (current) use of insulin: Secondary | ICD-10-CM | POA: Insufficient documentation

## 2020-07-04 DIAGNOSIS — Z79899 Other long term (current) drug therapy: Secondary | ICD-10-CM | POA: Insufficient documentation

## 2020-07-04 DIAGNOSIS — I10 Essential (primary) hypertension: Secondary | ICD-10-CM | POA: Insufficient documentation

## 2020-07-04 DIAGNOSIS — E1110 Type 2 diabetes mellitus with ketoacidosis without coma: Secondary | ICD-10-CM | POA: Insufficient documentation

## 2020-07-04 DIAGNOSIS — M25511 Pain in right shoulder: Secondary | ICD-10-CM | POA: Diagnosis not present

## 2020-07-04 LAB — CBC
HCT: 39.6 % (ref 36.0–46.0)
Hemoglobin: 13.2 g/dL (ref 12.0–15.0)
MCH: 30.7 pg (ref 26.0–34.0)
MCHC: 33.3 g/dL (ref 30.0–36.0)
MCV: 92.1 fL (ref 80.0–100.0)
Platelets: 205 10*3/uL (ref 150–400)
RBC: 4.3 MIL/uL (ref 3.87–5.11)
RDW: 14.3 % (ref 11.5–15.5)
WBC: 5.6 10*3/uL (ref 4.0–10.5)
nRBC: 0 % (ref 0.0–0.2)

## 2020-07-04 LAB — COMPREHENSIVE METABOLIC PANEL
ALT: 23 U/L (ref 0–44)
AST: 30 U/L (ref 15–41)
Albumin: 3.8 g/dL (ref 3.5–5.0)
Alkaline Phosphatase: 56 U/L (ref 38–126)
Anion gap: 9 (ref 5–15)
BUN: 16 mg/dL (ref 6–20)
CO2: 25 mmol/L (ref 22–32)
Calcium: 8.8 mg/dL — ABNORMAL LOW (ref 8.9–10.3)
Chloride: 101 mmol/L (ref 98–111)
Creatinine, Ser: 0.94 mg/dL (ref 0.44–1.00)
GFR calc Af Amer: >60 mL/min (ref >60)
GFR calc non Af Amer: >60 mL/min (ref >60)
Glucose, Bld: 150 mg/dL — ABNORMAL HIGH (ref 70–99)
Potassium: 4 mmol/L (ref 3.5–5.1)
Sodium: 135 mmol/L (ref 135–145)
Total Bilirubin: 0.5 mg/dL (ref 0.3–1.2)
Total Protein: 7 g/dL (ref 6.5–8.1)

## 2020-07-04 MED ORDER — NAPROXEN 500 MG PO TABS: 500.0000 mg | ORAL_TABLET | Freq: Two times a day (BID) | ORAL | 0 refills | Status: AC

## 2020-07-04 MED ORDER — KETOROLAC TROMETHAMINE 30 MG/ML IJ SOLN
30.0000 mg | Freq: Once | INTRAMUSCULAR | Status: AC
  Administered 2020-07-04: 30 mg via INTRAMUSCULAR
  Filled 2020-07-04: qty 1

## 2020-07-04 MED ORDER — CYCLOBENZAPRINE HCL 5 MG PO TABS: 5.0000 mg | ORAL_TABLET | Freq: Three times a day (TID) | ORAL | 0 refills | Status: DC | PRN

## 2020-07-04 NOTE — ED Provider Notes (Signed)
[facility] Emergency Department Provider Note   ____________________________________________   First MD Initiated Contact with Patient 07/04/20 1054     (approximate)  I have reviewed the triage vital signs and the nursing notes.   HISTORY  Chief Complaint Shoulder Pain (pain all over. diagnosed with herniated disc in neck)    HPI Jody Chavez is a 39 y.o. female with past medical history of hypertension, diabetes, and Wolff-Parkinson-White who presents to the ED complaining of neck pain.  Patient reports that she has been dealing with pain in her neck for the past 5 months.  It is primarily on the right side and radiates along down to her right arm with some numbness and tingling in her fingers.  It also affects her left arm but is not as severe on that side.  She states the pain occasionally causes her to drop things, but she denies any weakness in her arms.  She has not had any numbness or weakness in her lower extremities.  She saw her PCP for this problem and was prescribed Lidoderm patches and referred to a neurologist.  She states she has not yet seen a neurosurgeon for this issue.        Past Medical History:  Diagnosis Date  . Anxiety   . Depressed   . Diabetes mellitus without complication (HCC)   . Ectopic pregnancy   . Hypertension   . IBS (irritable bowel syndrome)   . Paroxysmal SVT (supraventricular tachycardia) (HCC) WPW  . Wolff-Parkinson-White (WPW) syndrome     Patient Active Problem List   Diagnosis Date Noted  . DKA (diabetic ketoacidoses) (HCC) 11/01/2018  . Generalized abdominal pain 04/06/2017  . DKA (diabetic ketoacidosis) (HCC) 11/03/2016  . Difficulty in swallowing 11/20/2015    Past Surgical History:  Procedure Laterality Date  . ABDOMINAL SURGERY    . CARDIAC ELECTROPHYSIOLOGY MAPPING AND ABLATION    . CESAREAN SECTION    . CHOLECYSTECTOMY    . DIAGNOSTIC LAPAROSCOPY WITH REMOVAL OF ECTOPIC PREGNANCY Right  03/04/2016   Procedure: DIAGNOSTIC LAPAROSCOPY WITH right salpingectomy and REMOVAL OF ECTOPIC PREGNANCY, extensive lysis of adhesions;  Surgeon: Suzy Bouchard, MD;  Location: [facility] ORS;  Service: Gynecology;  Laterality: Right;  . ENDOMETRIAL ABLATION    . HEART ABLATION      Prior to Admission medications   Medication Sig Start Date End Date Taking? Authorizing Provider  ARIPiprazole (ABILIFY) 10 MG tablet Take 10 mg by mouth daily.    [provider]  cyclobenzaprine (FLEXERIL) 5 MG tablet Take 1 tablet (5 mg total) by mouth 3 (three) times daily as needed for muscle spasms. 07/04/20   Chesley Noon, MD  escitalopram (LEXAPRO) 20 MG tablet Take 20 mg by mouth daily.    [provider]  ibuprofen (ADVIL,MOTRIN) 600 MG tablet Take 1 tablet (600 mg total) by mouth every 6 (six) hours as needed. 11/24/18   Domenick Gong, MD  Insulin Aspart (NOVOLOG FLEXPEN [place]) Inject into the skin.    [provider]  insulin detemir (LEVEMIR) 100 UNIT/ML injection Inject 36 Units into the skin daily.    [provider]  Insulin Syringe-Needle U-100 (INSULIN SYRINGE .3CC/31GX5/16") 31G X 5/16" 0.3 ML MISC 5 times daily as directed. 11/04/16   Houston Siren, MD  levofloxacin (LEVAQUIN) 500 MG tablet Take 1 tablet (500 mg total) by mouth daily. 05/29/19   Larene Pickett, FNP  lidocaine (LIDODERM) 5 % Place 1 patch onto the skin every 12 (twelve)  hours. Remove & Discard patch within 12 hours or as directed by MD 02/15/20 02/14/21  Joni Reining, PA-C  lisinopril (PRINIVIL,ZESTRIL) 40 MG tablet Take 1 tablet (40 mg total) by mouth daily. 11/02/18   Auburn Bilberry, MD  mupirocin ointment Idelle Jo) 2 % Apply to affected area 3 times daily 07/20/19 07/19/20  Emily Filbert, MD  naproxen (NAPROSYN) 500 MG tablet Take 1 tablet (500 mg total) by mouth 2 (two) times daily with a meal for 7 days. 07/04/20 07/11/20  Chesley Noon, MD  oxyCODONE-acetaminophen (PERCOCET) 5-325 MG  tablet Take 1 tablet by mouth every 8 (eight) hours as needed. 02/28/20   Emily Filbert, MD  Polyethylene Glycol 3350 (PEG 3350) POWD Take 17 g by mouth 2 (two) times daily as needed. 02/13/18   [provider]  traMADol (ULTRAM) 50 MG tablet Take 1 tablet (50 mg total) by mouth every 12 (twelve) hours as needed. 02/15/20   Joni Reining, PA-C    Allergies Iodine, Shellfish allergy, and Penicillins  Family History  Problem Relation Age of Onset  . Gallbladder disease Mother     Social History Social History   Tobacco Use  . Smoking status: Current Every Day Smoker    Packs/day: 1.00    Types: Cigarettes  . Smokeless tobacco: Never Used  Vaping Use  . Vaping Use: Never used  Substance Use Topics  . Alcohol use: Yes    Comment: socially  . Drug use: No    Review of Systems  Constitutional: No fever/chills Eyes: No visual changes. ENT: No sore throat. Cardiovascular: Denies chest pain. Respiratory: Denies shortness of breath. Gastrointestinal: No abdominal pain.  No nausea, no vomiting.  No diarrhea.  No constipation. Genitourinary: Negative for dysuria. Musculoskeletal: Negative for back pain.  Positive for neck pain. Skin: Negative for rash. Neurological: Negative for headaches or focal weakness, positive for numbness and tingling.  ____________________________________________   PHYSICAL EXAM:  VITAL SIGNS: ED Triage Vitals  Enc Vitals Group     BP 07/04/20 0435 (!) 155/98     Pulse Rate 07/04/20 0435 (!) 105     Resp 07/04/20 0435 18     Temp 07/04/20 0435 98.3 F (36.8 C)     Temp Source 07/04/20 0435 Oral     SpO2 07/04/20 0435 99 %     Weight 07/04/20 0436 180 lb (81.6 kg)     Height 07/04/20 0436 5\' 3"  (1.6 m)     Head Circumference --      Peak Flow --      Pain Score 07/04/20 0436 7     Pain Loc --      Pain Edu? --      Excl. in GC? --     Constitutional: Alert and oriented. Eyes: Conjunctivae are normal. Head:  Atraumatic. Nose: No congestion/rhinnorhea. Mouth/Throat: Mucous membranes are moist. Neck: Normal ROM Cardiovascular: Normal rate, regular rhythm. Grossly normal heart sounds.  2+ radial pulses bilaterally. Respiratory: Normal respiratory effort.  No retractions. Lungs CTAB. Gastrointestinal: Soft and nontender. No distention. Genitourinary: deferred Musculoskeletal: No lower extremity tenderness nor edema. Neurologic:  Normal speech and language. No gross focal neurologic deficits are appreciated.  Strength 5/5 in bilateral upper extremities with grip, elbow flexion and extension, shoulder abduction. Skin:  Skin is warm, dry and intact. No rash noted. Psychiatric: Mood and affect are normal. Speech and behavior are normal.  ____________________________________________   LABS (all labs ordered are listed, but only abnormal results are displayed)  Labs Reviewed  COMPREHENSIVE METABOLIC PANEL - Abnormal; Notable for the following components:      Result Value   Glucose, Bld 150 (*)    Calcium 8.8 (*)    All other components within normal limits  CBC    PROCEDURES  Procedure(s) performed (including Critical Care):  Procedures   ____________________________________________   INITIAL IMPRESSION / ASSESSMENT AND PLAN / ED COURSE       39 year old female with past medical history of hypertension, diabetes, and Wolff-Parkinson-White presents to the ED with 5 months of gradually worsening pain in her neck radiating down both arms, right greater than left.  This is associated with some numbness and tingling, she reports occasionally dropping objects but does not have any overt weakness on exam.  Pulses are intact to bilateral upper extremities.  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  Repeat MRI not indicated today but she would benefit from outpatient  follow-up with neurosurgery.  We will prescribe Flexeril and naproxen for pain control and she was counseled to return to the ED for new or worsening symptoms.  Patient agrees with plan.      ____________________________________________   FINAL CLINICAL IMPRESSION(S) / ED DIAGNOSES  Final diagnoses:  Cervical radiculopathy     ED Discharge Orders         Ordered    cyclobenzaprine (FLEXERIL) 5 MG tablet  3 times daily PRN        07/04/20 1105    naproxen (NAPROSYN) 500 MG tablet  2 times daily with meals        07/04/20 1105           Note:  This document was prepared using Dragon voice recognition software and may include unintentional dictation errors.   Chesley Noon, MD 07/04/20 1113

## 2020-07-04 NOTE — ED Triage Notes (Signed)
Patient to ED for pain all over. Patient states she was diagnosed with a herniated disc in her neck. States her arms hurt, her shoulders and legs hurt. Patient states she is also diabetic and is wondering if it's diabetic neuropathy. Came yesterday but the wait time was 12 hours so she didn't stay

## 2020-07-10 ENCOUNTER — Ambulatory Visit
Admission: EM | Admit: 2020-07-10 | Discharge: 2020-07-10 | Disposition: A | Discharge status: Home / Self Care | Payer: BC Managed Care – PPO | Attending: Family Medicine | Admitting: Family Medicine

## 2020-07-10 ENCOUNTER — Encounter: Payer: Self-pay | Admitting: Emergency Medicine

## 2020-07-10 ENCOUNTER — Other Ambulatory Visit: Payer: Self-pay

## 2020-07-10 DIAGNOSIS — G629 Polyneuropathy, unspecified: Secondary | ICD-10-CM | POA: Diagnosis not present

## 2020-07-10 DIAGNOSIS — M5412 Radiculopathy, cervical region: Secondary | ICD-10-CM

## 2020-07-10 MED ORDER — HYDROCODONE-ACETAMINOPHEN 5-325 MG PO TABS
1.0000 | ORAL_TABLET | Freq: Three times a day (TID) | ORAL | 0 refills | Status: DC | PRN
Start: 2020-07-10 — End: 2020-09-25

## 2020-07-10 MED ORDER — GABAPENTIN 300 MG PO CAPS: 300.0000 mg | ORAL_CAPSULE | Freq: Three times a day (TID) | ORAL | 0 refills | Status: AC

## 2020-07-10 NOTE — Discharge Instructions (Signed)
Please discuss referral to endo with your PCP.  Medications as prescribed.  Be sure to get your nerve conduction study.  Best of luck  Dr. Adriana Simas

## 2020-07-10 NOTE — ED Triage Notes (Signed)
Pt c/o bilateral shoulder pain, neck pain. She states she has numbness and tingling in bilateral hands and mildly in her right leg. She was diagnosed with a herniated disc back in May. Pt states her pain has gotten worse since her ED visit on 07/04/20.

## 2020-07-10 NOTE — ED Provider Notes (Signed)
[facility]    CSN: [id number] Arrival date & time: 07/10/20  0944  History   Chief Complaint Chief Complaint  Patient presents with  . Neck Pain  . Shoulder Pain   HPI  39 year old female presents with the above complaints.  Patient has had ongoing neck pain and numbness and tingling for months.  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  She has seen a neurosurgeon.  She was scheduled to have a nerve conduction study and to see physiatry.  This has not yet been accomplished.  Patient states that this was due to insurance issues.  Patient states that she is now having bilateral neck pain and shoulder pain.  She has numbness and tingling in her hands and arms.  Also has some tingling in her right leg.  Patient has known uncontrolled diabetes.  She states that her pain is worsening.  She was seen in the ER on 9/4 and states that her pain has been worse since that time.  No relieving factors.  No other complaints at this time.  Past Medical History:  Diagnosis Date  . Anxiety   . Depressed   . Diabetes mellitus without complication (HCC)   . Ectopic pregnancy   . Hypertension   . IBS (irritable bowel syndrome)   . Paroxysmal SVT (supraventricular tachycardia) (HCC) WPW  . Wolff-Parkinson-White (WPW) syndrome     Patient Active Problem List   Diagnosis Date Noted  . DKA (diabetic ketoacidoses) (HCC) 11/01/2018  . Generalized abdominal pain 04/06/2017  . DKA (diabetic ketoacidosis) (HCC) 11/03/2016  . Difficulty in swallowing 11/20/2015    Past Surgical History:  Procedure Laterality Date  . ABDOMINAL SURGERY    . CARDIAC ELECTROPHYSIOLOGY MAPPING AND ABLATION    . CESAREAN SECTION    . CHOLECYSTECTOMY    . DIAGNOSTIC LAPAROSCOPY WITH REMOVAL OF ECTOPIC PREGNANCY Right 03/04/2016   Procedure: DIAGNOSTIC LAPAROSCOPY WITH right salpingectomy and REMOVAL OF ECTOPIC PREGNANCY, extensive lysis of adhesions;  Surgeon: Suzy Bouchard, MD;  Location: [facility] ORS;  Service: Gynecology;  Laterality: Right;  . ENDOMETRIAL ABLATION    . HEART ABLATION      OB History   No obstetric history on file.      Home Medications    Prior to Admission medications   Medication Sig Start Date End Date Taking? Authorizing Provider  ARIPiprazole (ABILIFY) 10 MG tablet Take 10 mg by mouth daily.   Yes [provider]  cyclobenzaprine (FLEXERIL) 5 MG tablet Take 1 tablet (5 mg total) by mouth 3 (three) times daily as needed for muscle spasms. 07/04/20  Yes Chesley Noon, MD  escitalopram (LEXAPRO) 20 MG tablet Take 20 mg by mouth daily.   Yes [provider]  insulin detemir (LEVEMIR) 100 UNIT/ML injection Inject 36 Units into the skin daily.   Yes [provider]  lidocaine (LIDODERM) 5 % Place 1 patch onto the skin every 12 (twelve) hours. Remove & Discard patch within 12 hours or as directed by MD 02/15/20 02/14/21 Yes Joni Reining, PA-C  lisinopril (PRINIVIL,ZESTRIL) 40 MG tablet Take 1 tablet (40 mg total) by mouth daily. 11/02/18  Yes Auburn Bilberry, MD  naproxen (NAPROSYN) 500 MG tablet Take 1 tablet (500 mg total) by mouth 2 (two) times daily with a meal for 7 days. 07/04/20 07/11/20 Yes Chesley Noon, MD  gabapentin (NEURONTIN) 300 MG capsule Take 1 capsule (300 mg total) by mouth 3 (three) times daily. 07/10/20  Shaina Gullatt G, DO  HYDROcodone-acetaminophen (NORCO/VICODIN) 5-325 MG tablet Take 1 tablet by mouth every 8 (eight) hours as needed. 07/10/20   Tommie Sams, DO  Insulin Aspart (NOVOLOG FLEXPEN [place]) Inject into the skin.    [provider]  Insulin Syringe-Needle U-100 (INSULIN SYRINGE .3CC/31GX5/16") 31G X 5/16" 0.3 ML MISC 5 times daily as directed. 11/04/16   Houston Siren, MD  mupirocin ointment Idelle Jo) 2 % Apply to affected area 3 times daily 07/20/19 07/19/20  Emily Filbert, MD    Family History Family History  Problem Relation Age of Onset  . Gallbladder  disease Mother     Social History Social History   Tobacco Use  . Smoking status: Current Every Day Smoker    Packs/day: 1.00    Types: Cigarettes  . Smokeless tobacco: Never Used  Vaping Use  . Vaping Use: Never used  Substance Use Topics  . Alcohol use: Yes    Comment: socially  . Drug use: No     Allergies   Iodine, Shellfish allergy, and Penicillins   Review of Systems Review of Systems Per HPI  Physical Exam Triage Vital Signs ED Triage Vitals  Enc Vitals Group     BP 07/10/20 1032 (!) 142/101     Pulse Rate 07/10/20 1032 (!) 103     Resp 07/10/20 1032 18     Temp 07/10/20 1032 98.7 F (37.1 C)     Temp Source 07/10/20 1032 Oral     SpO2 07/10/20 1032 99 %     Weight 07/10/20 1028 179 lb 14.3 oz (81.6 kg)     Height 07/10/20 1028 5\' 3"  (1.6 m)     Head Circumference --      Peak Flow --      Pain Score 07/10/20 1028 7     Pain Loc --      Pain Edu? --      Excl. in GC? --    Updated Vital Signs BP (!) 142/101 (BP Location: Left Arm)   Pulse (!) 103   Temp 98.7 F (37.1 C) (Oral)   Resp 18   Ht 5\' 3"  (1.6 m)   Wt 81.6 kg   LMP 06/23/2020   SpO2 99%   BMI 31.87 kg/m   Visual Acuity Right Eye Distance:   Left Eye Distance:   Bilateral Distance:    Right Eye Near:   Left Eye Near:    Bilateral Near:     Physical Exam Vitals and nursing note reviewed.  Constitutional:      General: She is not in acute distress.    Appearance: Normal appearance. She is not ill-appearing.  HENT:     Head: Normocephalic and atraumatic.  Eyes:     General:        Right eye: No discharge.        Left eye: No discharge.     Conjunctiva/sclera: Conjunctivae normal.  Cardiovascular:     Rate and Rhythm: Regular rhythm. Tachycardia present.  Pulmonary:     Effort: Pulmonary effort is normal.     Breath sounds: Normal breath sounds.  Neurological:     Mental Status: She is alert.  Psychiatric:        Mood and Affect: Mood normal.        Behavior:  Behavior normal.    UC Treatments / Results  Labs (all labs ordered are listed, but only abnormal results are displayed) Labs Reviewed - No data to display  EKG   Radiology No results found.  Procedures Procedures (including critical care time)  Medications Ordered in UC Medications - No data to display  Initial Impression / Assessment and Plan / UC Course  I have reviewed the triage vital signs and the nursing notes.  Pertinent labs & imaging results that were available during my care of the patient were reviewed by me and considered in my medical decision making (see chart for details).    39 year old female presents with cervical radiculopathy and suspected diabetic neuropathy.  Gabapentin as prescribed.  Short course of pain medication sent to the pharmacy.  Patient needs follow-up with primary care physician and will need referral to endocrinology given uncontrolled diabetes.  Patient also needs to be sure to proceed with nerve conduction study for further delineation.  Final Clinical Impressions(s) / UC Diagnoses   Final diagnoses:  Cervical radiculopathy  Neuropathy     Discharge Instructions     Please discuss referral to endo with your PCP.  Medications as prescribed.  Be sure to get your nerve conduction study.  Best of luck  Dr. Adriana Simas    ED Prescriptions    Medication Sig Dispense Auth. Provider   gabapentin (NEURONTIN) 300 MG capsule Take 1 capsule (300 mg total) by mouth 3 (three) times daily. 90 capsule Lakindra Wible G, DO   HYDROcodone-acetaminophen (NORCO/VICODIN) 5-325 MG tablet Take 1 tablet by mouth every 8 (eight) hours as needed. 10 tablet Everlene Other G, DO     I have reviewed the PDMP during this encounter.   Tommie Sams, [place] 07/10/20 1208

## 2020-07-16 ENCOUNTER — Ambulatory Visit
Admission: EM | Admit: 2020-07-16 | Discharge: 2020-07-16 | Disposition: A | Discharge status: Home / Self Care | Payer: BC Managed Care – PPO | Attending: Family Medicine | Admitting: Family Medicine

## 2020-07-16 ENCOUNTER — Other Ambulatory Visit: Payer: Self-pay

## 2020-07-16 DIAGNOSIS — G8929 Other chronic pain: Secondary | ICD-10-CM | POA: Diagnosis not present

## 2020-07-16 NOTE — ED Triage Notes (Signed)
Pt states she has herniated disc and pinched nerves. Seen here for same last week. Dr. Adriana Simas started her on Gabapentin and Vicodin. States she still has pain in neck and into shoulders and down both arms. Has pain and numbness/tingling. Pain is worse now and has appointments with neurologist and ortho coming up. Awaiting MRI. Pt wants FMLA until her EMG appointment Oct 4th.

## 2020-07-16 NOTE — Discharge Instructions (Signed)
Please follow up with your PCP.  Take care  Dr. Maryum Batterson  

## 2020-07-17 NOTE — ED Provider Notes (Signed)
[facility]    CSN: [id number] Arrival date & time: 07/16/20  1429  History   Chief Complaint Chief Complaint  Patient presents with  . Neck Pain  . Arm Pain   HPI  39 year old female presents with continued pain.  Patient seen by me on 9/10. Continues to have neck pain, numbness/tingling. Pain is severe. She states that she is having difficultly doing her job secondary to pain. Patient is requesting a note for work (until she is able to see her PCP). Little improvement with gabapentin and PRN Vicodin. Pain currently 9/10 in severity.  Past Medical History:  Diagnosis Date  . Anxiety   . Depressed   . Diabetes mellitus without complication (HCC)   . Ectopic pregnancy   . Hypertension   . IBS (irritable bowel syndrome)   . Paroxysmal SVT (supraventricular tachycardia) (HCC) WPW  . Wolff-Parkinson-White (WPW) syndrome     Patient Active Problem List   Diagnosis Date Noted  . DKA (diabetic ketoacidoses) (HCC) 11/01/2018  . Generalized abdominal pain 04/06/2017  . DKA (diabetic ketoacidosis) (HCC) 11/03/2016  . Difficulty in swallowing 11/20/2015    Past Surgical History:  Procedure Laterality Date  . ABDOMINAL SURGERY    . CARDIAC ELECTROPHYSIOLOGY MAPPING AND ABLATION    . CESAREAN SECTION    . CHOLECYSTECTOMY    . DIAGNOSTIC LAPAROSCOPY WITH REMOVAL OF ECTOPIC PREGNANCY Right 03/04/2016   Procedure: DIAGNOSTIC LAPAROSCOPY WITH right salpingectomy and REMOVAL OF ECTOPIC PREGNANCY, extensive lysis of adhesions;  Surgeon: Suzy Bouchard, MD;  Location: [facility] ORS;  Service: Gynecology;  Laterality: Right;  . ENDOMETRIAL ABLATION    . HEART ABLATION      OB History   No obstetric history on file.      Home Medications    Prior to Admission medications   Medication Sig Start Date End Date Taking? Authorizing Provider  ARIPiprazole (ABILIFY) 10 MG tablet Take 10 mg by mouth daily.    [provider]  cyclobenzaprine (FLEXERIL) 5 MG tablet  Take 1 tablet (5 mg total) by mouth 3 (three) times daily as needed for muscle spasms. 07/04/20   Chesley Noon, MD  escitalopram (LEXAPRO) 20 MG tablet Take 20 mg by mouth daily.    [provider]  gabapentin (NEURONTIN) 300 MG capsule Take 1 capsule (300 mg total) by mouth 3 (three) times daily. 07/10/20   Tommie Sams, DO  HYDROcodone-acetaminophen (NORCO/VICODIN) 5-325 MG tablet Take 1 tablet by mouth every 8 (eight) hours as needed. 07/10/20   Tommie Sams, DO  Insulin Aspart (NOVOLOG FLEXPEN [place]) Inject into the skin.    [provider]  insulin detemir (LEVEMIR) 100 UNIT/ML injection Inject 36 Units into the skin daily.    [provider]  Insulin Syringe-Needle U-100 (INSULIN SYRINGE .3CC/31GX5/16") 31G X 5/16" 0.3 ML MISC 5 times daily as directed. 11/04/16   Houston Siren, MD  lidocaine (LIDODERM) 5 % Place 1 patch onto the skin every 12 (twelve) hours. Remove & Discard patch within 12 hours or as directed by MD 02/15/20 02/14/21  Joni Reining, PA-C  lisinopril (PRINIVIL,ZESTRIL) 40 MG tablet Take 1 tablet (40 mg total) by mouth daily. 11/02/18   Auburn Bilberry, MD  mupirocin ointment Idelle Jo) 2 % Apply to affected area 3 times daily 07/20/19 07/19/20  Emily Filbert, MD    Family History Family History  Problem Relation Age of Onset  . Gallbladder disease Mother     Social History Social History  Tobacco Use  . Smoking status: Current Every Day Smoker    Packs/day: 1.00    Types: Cigarettes  . Smokeless tobacco: Never Used  Vaping Use  . Vaping Use: Never used  Substance Use Topics  . Alcohol use: Yes    Comment: socially  . Drug use: No     Allergies   Iodine, Shellfish allergy, and Penicillins   Review of Systems Review of Systems  Musculoskeletal: Positive for neck pain.  Neurological: Positive for numbness.   Physical Exam Triage Vital Signs ED Triage Vitals  Enc Vitals Group     BP 07/16/20 1453 (!) 141/107     Pulse  Rate 07/16/20 1453 98     Resp 07/16/20 1453 18     Temp 07/16/20 1453 98.5 F (36.9 C)     Temp Source 07/16/20 1453 Oral     SpO2 07/16/20 1453 97 %     Weight 07/16/20 1453 179 lb 14.3 oz (81.6 kg)     Height 07/16/20 1453 5\' 3"  (1.6 m)     Head Circumference --      Peak Flow --      Pain Score 07/16/20 1452 9     Pain Loc --      Pain Edu? --      Excl. in GC? --    Updated Vital Signs BP (!) 141/107 (BP Location: Left Arm)   Pulse 98   Temp 98.5 F (36.9 C) (Oral)   Resp 18   Ht 5\' 3"  (1.6 m)   Wt 81.6 kg   LMP 06/23/2020   SpO2 97%   BMI 31.87 kg/m   Visual Acuity Right Eye Distance:   Left Eye Distance:   Bilateral Distance:    Right Eye Near:   Left Eye Near:    Bilateral Near:     Physical Exam Constitutional:      General: She is not in acute distress.    Appearance: She is obese. She is not ill-appearing.  Eyes:     General:        Right eye: No discharge.        Left eye: No discharge.     Conjunctiva/sclera: Conjunctivae normal.  Pulmonary:     Effort: Pulmonary effort is normal. No respiratory distress.  Neurological:     Mental Status: She is alert.  Psychiatric:     Comments: Flat affect. Depressed mood.    UC Treatments / Results  Labs (all labs ordered are listed, but only abnormal results are displayed) Labs Reviewed - No data to display  EKG   Radiology No results found.  Procedures Procedures (including critical care time)  Medications Ordered in UC Medications - No data to display  Initial Impression / Assessment and Plan / UC Course  I have reviewed the triage vital signs and the nursing notes.  Pertinent labs & imaging results that were available during my care of the patient were reviewed by me and considered in my medical decision making (see chart for details).    39 year old female presents with chronic pain. Note for work given. Follow up with PCP.  Final Clinical Impressions(s) / UC Diagnoses   Final  diagnoses:  Other chronic pain     Discharge Instructions     Please follow up with your PCP.  Take care  Dr. 06/25/2020    ED Prescriptions    None     PDMP not reviewed this encounter.   30  G, DO 07/17/20 1525

## 2020-08-05 ENCOUNTER — Other Ambulatory Visit (INDEPENDENT_AMBULATORY_CARE_PROVIDER_SITE_OTHER): Payer: Self-pay | Admitting: Family Medicine

## 2020-09-01 IMAGING — CR DG CHEST 2V
2 series · 2 of 2 positions shown · non-contrast
Comparison: 11/21/2017

CLINICAL DATA: Chest pain.

EXAM:
CHEST - 2 VIEW

[chest pa]
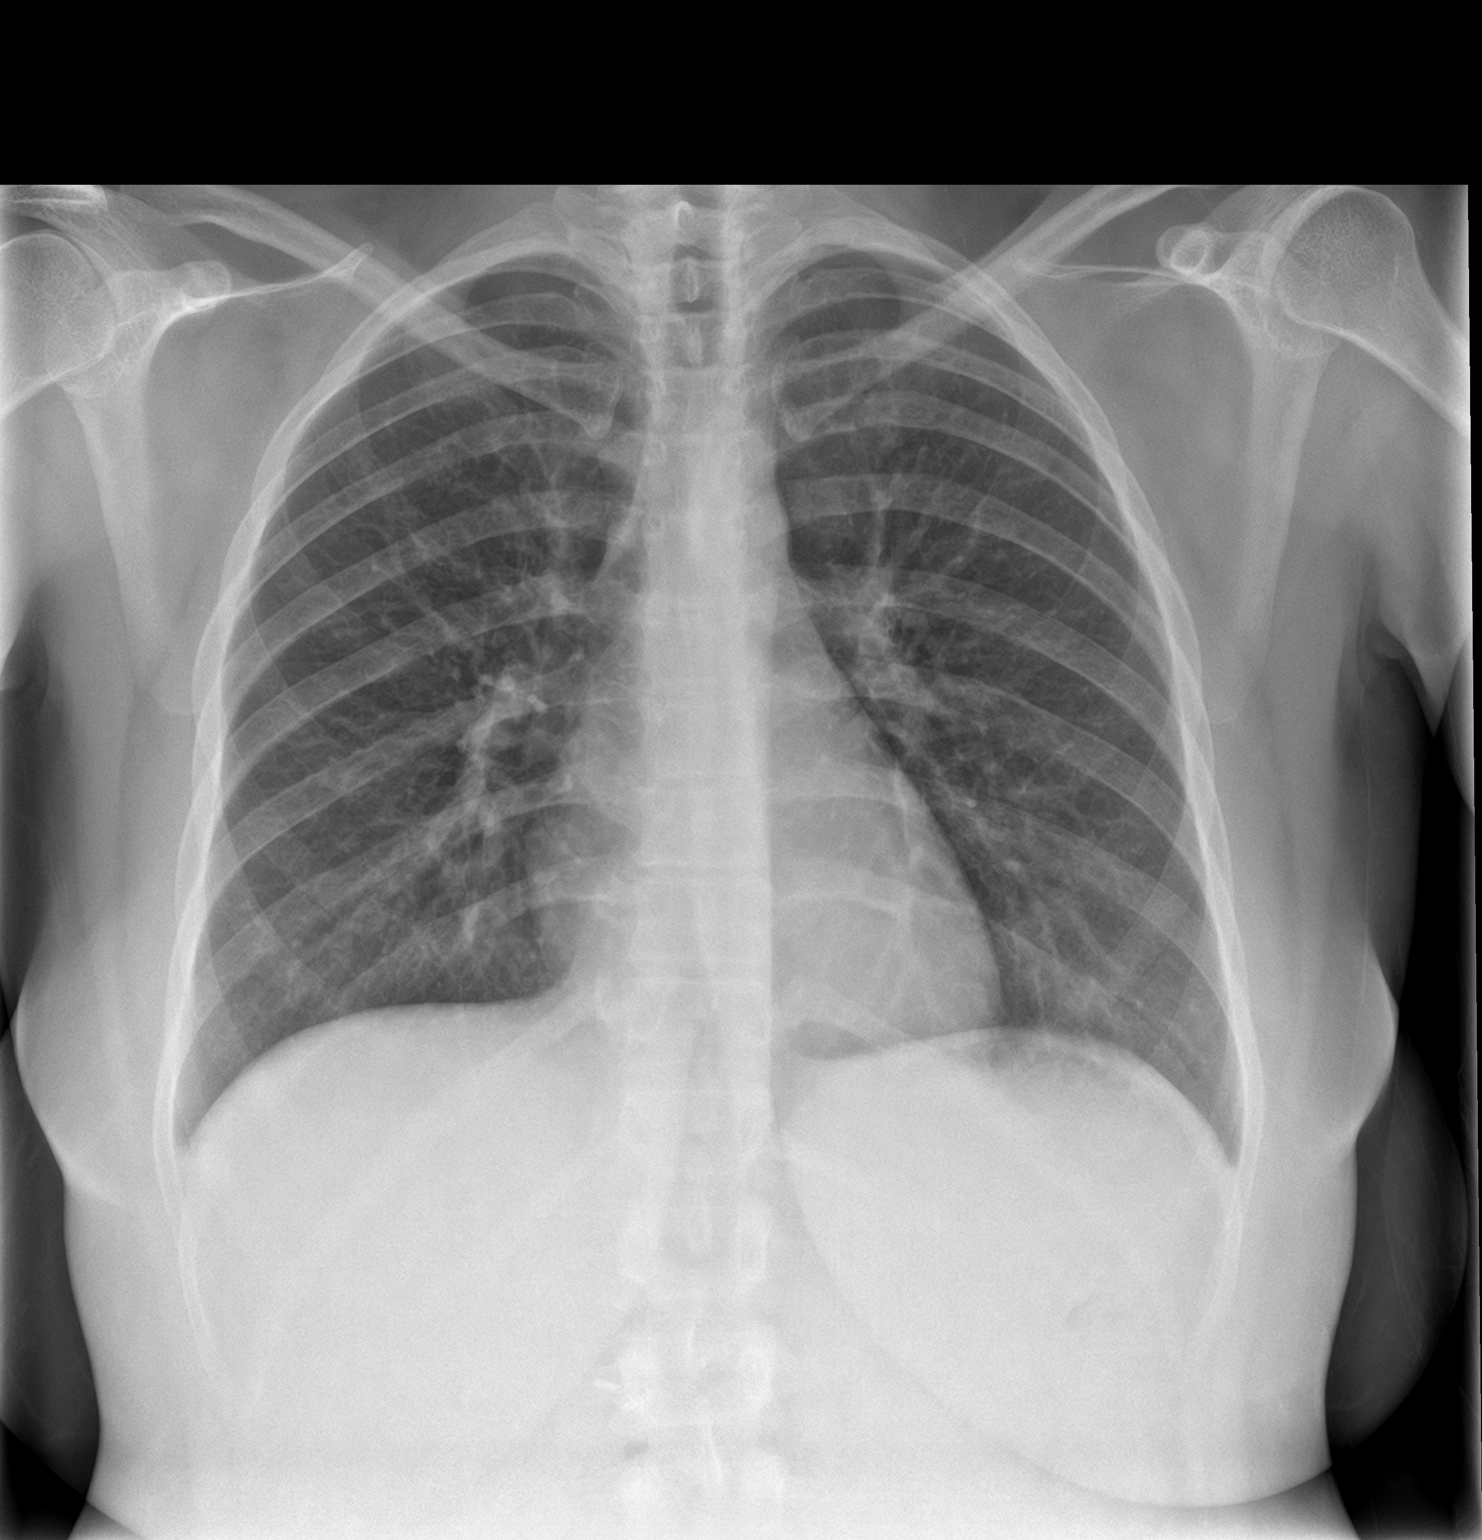

[chest lat]
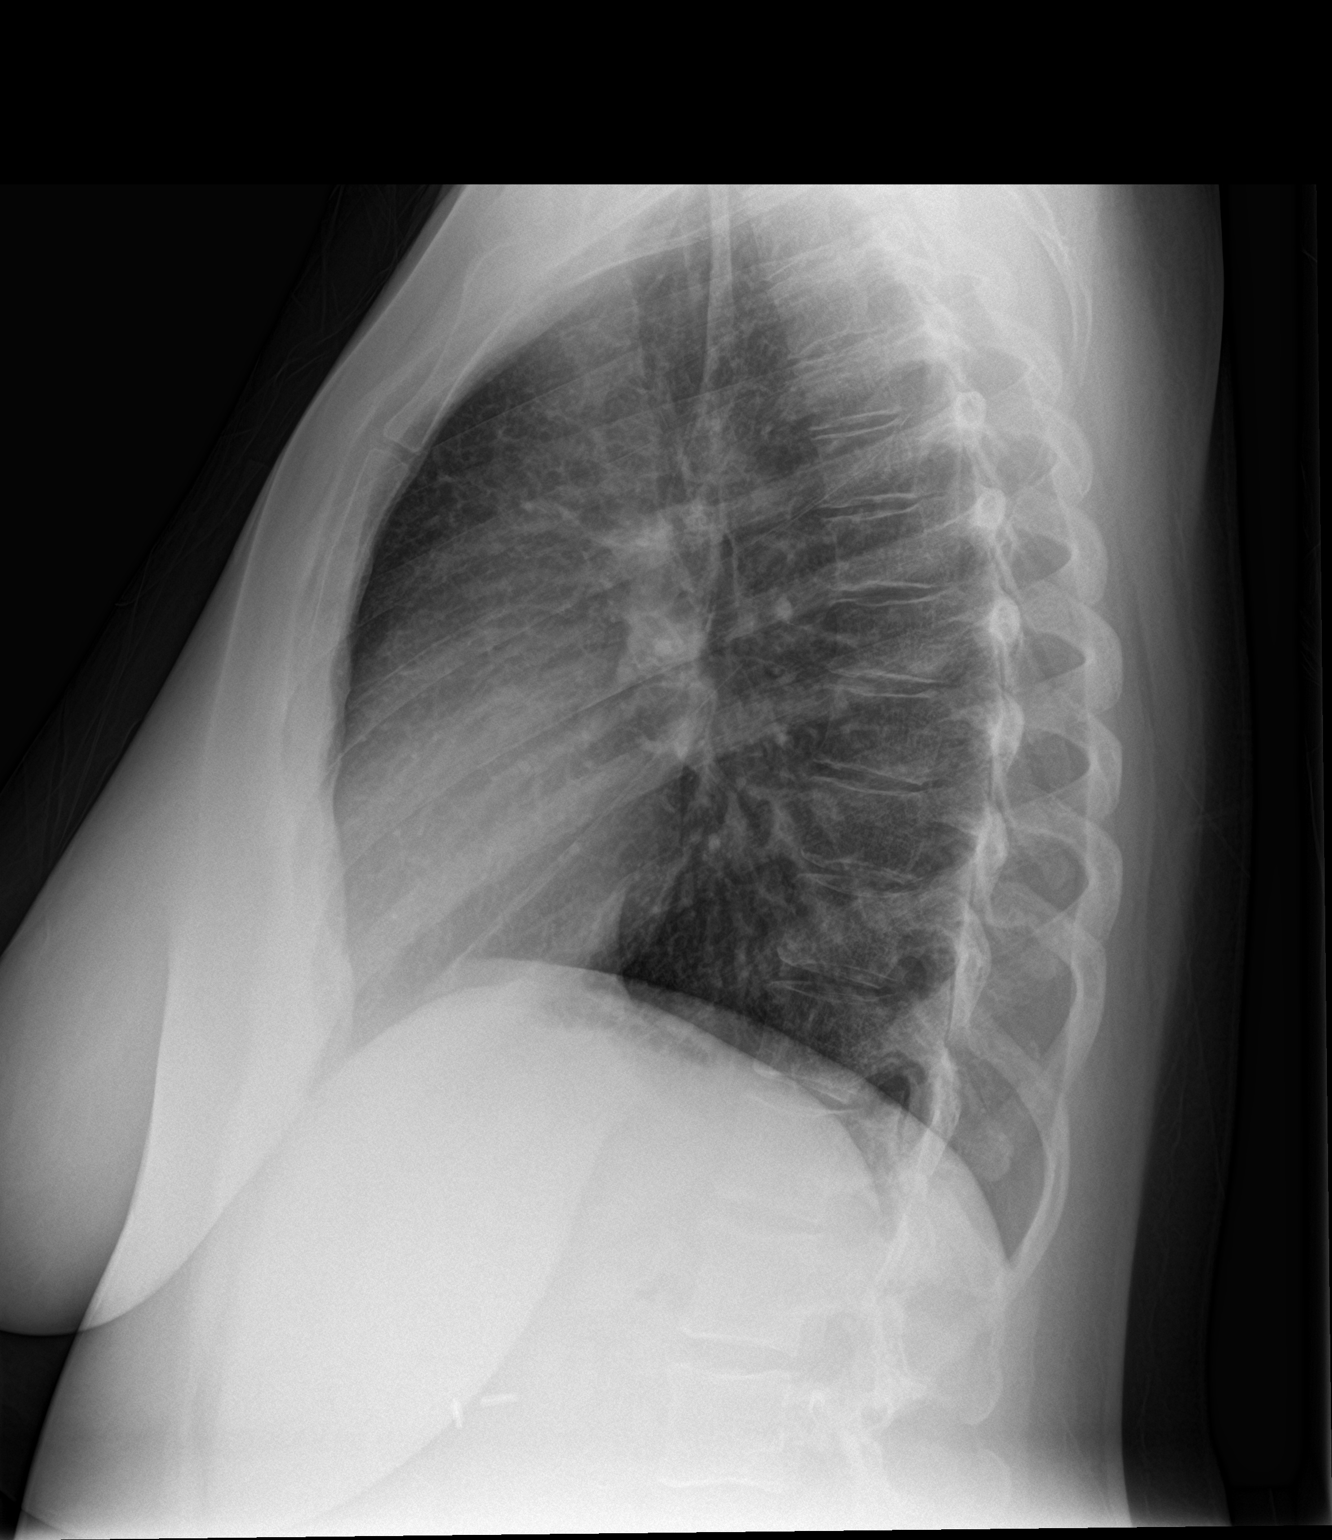

[2 of 2 positions shown; findings below may reference images not displayed]

FINDINGS: The heart size and mediastinal contours are within normal limits.
Both lungs are clear. The visualized skeletal structures are
unremarkable.
IMPRESSION: Normal exam.

## 2020-09-25 ENCOUNTER — Ambulatory Visit
Admission: EM | Admit: 2020-09-25 | Discharge: 2020-09-25 | Disposition: A | Discharge status: Home / Self Care | Payer: 59 | Attending: Emergency Medicine | Admitting: Emergency Medicine

## 2020-09-25 ENCOUNTER — Other Ambulatory Visit: Payer: Self-pay

## 2020-09-25 ENCOUNTER — Encounter: Payer: Self-pay | Admitting: Emergency Medicine

## 2020-09-25 DIAGNOSIS — Z20822 Contact with and (suspected) exposure to covid-19: Secondary | ICD-10-CM | POA: Insufficient documentation

## 2020-09-25 DIAGNOSIS — J0110 Acute frontal sinusitis, unspecified: Secondary | ICD-10-CM | POA: Diagnosis not present

## 2020-09-25 DIAGNOSIS — J069 Acute upper respiratory infection, unspecified: Secondary | ICD-10-CM | POA: Insufficient documentation

## 2020-09-25 LAB — PREGNANCY, URINE: Preg Test, Ur: NEGATIVE

## 2020-09-25 LAB — RESP PANEL BY RT-PCR (FLU A&B, COVID) ARPGX2
Influenza A by PCR: NEGATIVE
Influenza B by PCR: NEGATIVE
SARS Coronavirus 2 by RT PCR: NEGATIVE

## 2020-09-25 MED ORDER — DOXYCYCLINE HYCLATE 100 MG PO CAPS: 100.0000 mg | ORAL_CAPSULE | Freq: Two times a day (BID) | ORAL | 0 refills | Status: AC

## 2020-09-25 MED ORDER — FLUTICASONE PROPIONATE 50 MCG/ACT NA SUSP: 2.0000 | Freq: Every day | NASAL | 0 refills | Status: DC

## 2020-09-25 MED ORDER — IBUPROFEN 600 MG PO TABS: 600.0000 mg | ORAL_TABLET | Freq: Four times a day (QID) | ORAL | 0 refills | Status: DC | PRN

## 2020-09-25 NOTE — ED Triage Notes (Signed)
Patient c/o cough, sinus congestion, HAs, chest congestion, and bodyaches that started 3 days ago.  Patient reports fever of 101.

## 2020-09-25 NOTE — ED Provider Notes (Signed)
HPI  SUBJECTIVE:  Jody Chavez is a 39 y.o. female who presents with a URI for the past 3 days.  She reports fevers of 101, body aches, headaches, nasal congestion, brown rhinorrhea, sinus pain and pressure, sore throat, loss of sense of taste and smell, cough, nausea and ear fullness.  No postnasal drip, shortness of breath, nausea, vomiting, diarrhea, abdominal pain.  No known Covid or flu exposure.  She states that she got both the Covid and flu vaccine.  No antibiotics in the past month.  No antipyretic in the past 6 hours.  No upper dental pain or facial swelling.  She has tried ibuprofen with relief from headache and fever, diabetic Tussin.  No aggravating factors.  She has a past medical history of hypertension, diabetes type 1, DKA x2, WPW with PSVT status post ablation, IBS.  No history of asthma, chronic kidney disease.  LMP: 2 weeks late.  Is unsure if she could be pregnant.  PMD: [employer].   Past Medical History:  Diagnosis Date  . Anxiety   . Depressed   . Diabetes mellitus without complication (HCC)   . Ectopic pregnancy   . Hypertension   . IBS (irritable bowel syndrome)   . Paroxysmal SVT (supraventricular tachycardia) (HCC) WPW  . Wolff-Parkinson-White (WPW) syndrome     Past Surgical History:  Procedure Laterality Date  . ABDOMINAL SURGERY    . CARDIAC ELECTROPHYSIOLOGY MAPPING AND ABLATION    . CESAREAN SECTION    . CHOLECYSTECTOMY    . DIAGNOSTIC LAPAROSCOPY WITH REMOVAL OF ECTOPIC PREGNANCY Right 03/04/2016   Procedure: DIAGNOSTIC LAPAROSCOPY WITH right salpingectomy and REMOVAL OF ECTOPIC PREGNANCY, extensive lysis of adhesions;  Surgeon: Suzy Bouchard, MD;  Location: [facility] ORS;  Service: Gynecology;  Laterality: Right;  . ENDOMETRIAL ABLATION    . HEART ABLATION      Family History  Problem Relation Age of Onset  . Gallbladder disease Mother     Social History   Tobacco Use  . Smoking status: Current Every Day Smoker     Packs/day: 1.00    Types: Cigarettes  . Smokeless tobacco: Never Used  Vaping Use  . Vaping Use: Never used  Substance Use Topics  . Alcohol use: Yes    Comment: socially  . Drug use: No    No current facility-administered medications for this encounter.  Current Outpatient Medications:  .  ARIPiprazole (ABILIFY) 10 MG tablet, Take 10 mg by mouth daily., Disp: , Rfl:  .  escitalopram (LEXAPRO) 20 MG tablet, Take 20 mg by mouth daily., Disp: , Rfl:  .  gabapentin (NEURONTIN) 300 MG capsule, Take 1 capsule (300 mg total) by mouth 3 (three) times daily., Disp: 90 capsule, Rfl: 0 .  Insulin Aspart (NOVOLOG FLEXPEN [place]), Inject into the skin., Disp: , Rfl:  .  insulin detemir (LEVEMIR) 100 UNIT/ML injection, Inject 36 Units into the skin daily., Disp: , Rfl:  .  lisinopril (PRINIVIL,ZESTRIL) 40 MG tablet, Take 1 tablet (40 mg total) by mouth daily., Disp: 30 tablet, Rfl: 0 .  cyclobenzaprine (FLEXERIL) 5 MG tablet, Take 1 tablet (5 mg total) by mouth 3 (three) times daily as needed for muscle spasms., Disp: 30 tablet, Rfl: 0 .  doxycycline (VIBRAMYCIN) 100 MG capsule, Take 1 capsule (100 mg total) by mouth 2 (two) times daily for 10 days., Disp: 20 capsule, Rfl: 0 .  fluticasone (FLONASE) 50 MCG/ACT nasal spray, Place 2 sprays into both nostrils daily., Disp: 16 g, Rfl: 0 .  ibuprofen (ADVIL) 600 MG tablet, Take 1 tablet (600 mg total) by mouth every 6 (six) hours as needed., Disp: 30 tablet, Rfl: 0 .  Insulin Syringe-Needle U-100 (INSULIN SYRINGE .3CC/31GX5/16") 31G X 5/16" 0.3 ML MISC, 5 times daily as directed., Disp: 100 each, Rfl: 1 .  lidocaine (LIDODERM) 5 %, Place 1 patch onto the skin every 12 (twelve) hours. Remove & Discard patch within 12 hours or as directed by MD, Disp: 10 patch, Rfl: 0  Allergies  Allergen Reactions  . Iodine Anaphylaxis  . Shellfish Allergy Anaphylaxis  . Penicillins Hives and Other (See Comments)    Has patient had a PCN reaction causing immediate rash,  facial/tongue/throat swelling, SOB or lightheadedness with hypotension: No Has patient had a PCN reaction causing severe rash involving mucus membranes or skin necrosis: No Has patient had a PCN reaction that required hospitalization No Has patient had a PCN reaction occurring within the last 10 years: No If all of the above answers are "NO", then may proceed with Cephalosporin use.     ROS  As noted in HPI.   Physical Exam  BP (!) 139/100 (BP Location: Left Arm)   Pulse 97   Temp 98.2 F (36.8 C) (Oral)   Resp 14   Ht 5\' 3"  (1.6 m)   Wt 81.6 kg   LMP 08/04/2020 (Approximate)   SpO2 99%   BMI 31.89 kg/m   Constitutional: Well developed, well nourished,.  Appears ill Eyes:  EOMI, conjunctiva normal bilaterally HENT: Normocephalic, atraumatic,mucus membranes moist erythematous, swollen turbinates.  Purulent nasal congestion.  Positive maxillary sinus tenderness.  No frontal sinus tenderness.  Normal oropharynx.  Positive postnasal drip. Neck: Positive cervical lymphadenopathy.  No meningismus. Respiratory: Normal inspiratory effort lungs clear bilaterally Cardiovascular: Normal rate regular rhythm no murmurs rubs or gallops GI: nondistended skin: No rash, skin intact Musculoskeletal: no deformities Neurologic: Alert & oriented x 3, no focal neuro deficits Psychiatric: Speech and behavior appropriate   ED Course   Medications - No data to display  Orders Placed This Encounter  Procedures  . Resp Panel by RT-PCR (Flu A&B, Covid) Nasopharyngeal Swab    Standing Status:   Standing    Number of Occurrences:   1    Order Specific Question:   Is this test for diagnosis or screening    Answer:   Diagnosis of ill patient    Order Specific Question:   Symptomatic for COVID-19 as defined by CDC    Answer:   Yes    Order Specific Question:   Date of Symptom Onset    Answer:   09/22/2020    Order Specific Question:   Hospitalized for COVID-19    Answer:   No    Order  Specific Question:   Admitted to ICU for COVID-19    Answer:   No    Order Specific Question:   Previously tested for COVID-19    Answer:   Yes    Order Specific Question:   Resident in a congregate (group) care setting    Answer:   No    Order Specific Question:   Employed in healthcare setting    Answer:   No    Order Specific Question:   Pregnant    Answer:   No    Order Specific Question:   Has patient completed COVID vaccination(s) (2 doses of Pfizer/Moderna 1 dose of 09/24/2020)    Answer:   Yes  . Pregnancy, urine  Standing Status:   Standing    Number of Occurrences:   1    Results for orders placed or performed during the hospital encounter of 09/25/20 (from the past 24 hour(s))  Resp Panel by RT-PCR (Flu A&B, Covid) Nasopharyngeal Swab     Status: None   Collection Time: 09/25/20  1:28 PM   Specimen: Nasopharyngeal Swab; Nasopharyngeal(NP) swabs in vial transport medium  Result Value Ref Range   SARS Coronavirus 2 by RT PCR NEGATIVE NEGATIVE   Influenza A by PCR NEGATIVE NEGATIVE   Influenza B by PCR NEGATIVE NEGATIVE  Pregnancy, urine     Status: None   Collection Time: 09/25/20  1:28 PM  Result Value Ref Range   Preg Test, Ur NEGATIVE NEGATIVE   No results found.  ED Clinical Impression  1. Acute non-recurrent frontal sinusitis   2. Upper respiratory tract infection, unspecified type      ED Assessment/Plan  Urine pregnancy negative.  Covid, flu negative.  Patient with a respiratory infection.  However she has purulent nasal congestion, maxillary sinus tenderness, fevers to 101 and she has multiple comorbidities including diabetes type 1, so will send home with doxycycline for sinusitis.  Flonase, saline nasal irrigation, Tessalon.  Work note for today and tomorrow.  Follow-up with PMD in 2 to 3 days.  To the ER if she gets worse.  Discussed labs,  MDM, treatment plan, and plan for follow-up with patient. Discussed sn/sx that should prompt return to  the ED. patient agrees with plan.   Meds ordered this encounter  Medications  . doxycycline (VIBRAMYCIN) 100 MG capsule    Sig: Take 1 capsule (100 mg total) by mouth 2 (two) times daily for 10 days.    Dispense:  20 capsule    Refill:  0  . fluticasone (FLONASE) 50 MCG/ACT nasal spray    Sig: Place 2 sprays into both nostrils daily.    Dispense:  16 g    Refill:  0  . ibuprofen (ADVIL) 600 MG tablet    Sig: Take 1 tablet (600 mg total) by mouth every 6 (six) hours as needed.    Dispense:  30 tablet    Refill:  0    *This clinic note was created using Scientist, clinical (histocompatibility and immunogenetics). Therefore, there may be occasional mistakes despite careful proofreading.   ?    Domenick Gong, MD 09/26/20 [phone]

## 2020-09-25 NOTE — Discharge Instructions (Addendum)
Finish the doxycycline, even if you feel better.  Start Mucinex o keep the mucous thin.  Drink extra water.  Return to the ER if you get worse, have a fever >100.4, or for any concerns. You may take 600 mg of motrin with 1 gram of tylenol up to 3-4 times a day as needed for pain. This is an effective combination for pain.   Use a NeilMed sinus rinse as often as you want to to reduce nasal congestion. Follow the directions on the box.  Keep a close eye on your glucose and adjust your insulin accordingly if you can.  Follow-up with your doctor in 3 days if your sugar remains high.  Go to the ER for any signs of DKA.  Go to www.goodrx.com to look up your medications. This will give you a list of where you can find your prescriptions at the most affordable prices. Or you can ask the pharmacist what the cash price is. This is frequently cheaper than going through insurance.

## 2020-10-08 ENCOUNTER — Other Ambulatory Visit (INDEPENDENT_AMBULATORY_CARE_PROVIDER_SITE_OTHER): Payer: Self-pay | Admitting: Family Medicine

## 2020-10-15 ENCOUNTER — Emergency Department: Admission: EM | Admit: 2020-10-15 | Discharge: 2020-10-15 | Discharge status: Against Medical Advice | Payer: 59

## 2020-10-15 ENCOUNTER — Other Ambulatory Visit: Payer: Self-pay

## 2020-11-02 ENCOUNTER — Emergency Department
Admission: EM | Admit: 2020-11-02 | Discharge: 2020-11-02 | Disposition: A | Discharge status: Home / Self Care | Payer: BC Managed Care – PPO | Attending: Emergency Medicine | Admitting: Emergency Medicine

## 2020-11-02 ENCOUNTER — Other Ambulatory Visit: Payer: Self-pay

## 2020-11-02 ENCOUNTER — Encounter: Payer: Self-pay | Admitting: Emergency Medicine

## 2020-11-02 DIAGNOSIS — I10 Essential (primary) hypertension: Secondary | ICD-10-CM | POA: Insufficient documentation

## 2020-11-02 DIAGNOSIS — Z79899 Other long term (current) drug therapy: Secondary | ICD-10-CM | POA: Insufficient documentation

## 2020-11-02 DIAGNOSIS — R1033 Periumbilical pain: Secondary | ICD-10-CM | POA: Diagnosis present

## 2020-11-02 DIAGNOSIS — K2900 Acute gastritis without bleeding: Secondary | ICD-10-CM | POA: Insufficient documentation

## 2020-11-02 DIAGNOSIS — U071 COVID-19: Secondary | ICD-10-CM | POA: Diagnosis not present

## 2020-11-02 DIAGNOSIS — Z20822 Contact with and (suspected) exposure to covid-19: Secondary | ICD-10-CM | POA: Diagnosis not present

## 2020-11-02 DIAGNOSIS — Z794 Long term (current) use of insulin: Secondary | ICD-10-CM | POA: Diagnosis not present

## 2020-11-02 DIAGNOSIS — F17210 Nicotine dependence, cigarettes, uncomplicated: Secondary | ICD-10-CM | POA: Diagnosis not present

## 2020-11-02 DIAGNOSIS — E1110 Type 2 diabetes mellitus with ketoacidosis without coma: Secondary | ICD-10-CM | POA: Insufficient documentation

## 2020-11-02 LAB — URINALYSIS, COMPLETE (UACMP) WITH MICROSCOPIC
Bilirubin Urine: NEGATIVE
Glucose, UA: >=500 mg/dL — AB
Ketones, ur: NEGATIVE mg/dL
Nitrite: NEGATIVE
Protein, ur: 100 mg/dL — AB
RBC / HPF: >50 RBC/hpf — ABNORMAL HIGH (ref 0–5)
Specific Gravity, Urine: 1.03 (ref 1.005–1.030)
pH: 5 (ref 5.0–8.0)

## 2020-11-02 LAB — COMPREHENSIVE METABOLIC PANEL
ALT: 22 U/L (ref 0–44)
AST: 31 U/L (ref 15–41)
Albumin: 3.8 g/dL (ref 3.5–5.0)
Alkaline Phosphatase: 51 U/L (ref 38–126)
Anion gap: 7 (ref 5–15)
BUN: 14 mg/dL (ref 6–20)
CO2: 25 mmol/L (ref 22–32)
Calcium: 8.5 mg/dL — ABNORMAL LOW (ref 8.9–10.3)
Chloride: 104 mmol/L (ref 98–111)
Creatinine, Ser: 0.65 mg/dL (ref 0.44–1.00)
GFR, Estimated: >60 mL/min (ref >60)
Glucose, Bld: 199 mg/dL — ABNORMAL HIGH (ref 70–99)
Potassium: 3.8 mmol/L (ref 3.5–5.1)
Sodium: 136 mmol/L (ref 135–145)
Total Bilirubin: 0.6 mg/dL (ref 0.3–1.2)
Total Protein: 7.1 g/dL (ref 6.5–8.1)

## 2020-11-02 LAB — CBC
HCT: 39.7 % (ref 36.0–46.0)
Hemoglobin: 13.3 g/dL (ref 12.0–15.0)
MCH: 29.8 pg (ref 26.0–34.0)
MCHC: 33.5 g/dL (ref 30.0–36.0)
MCV: 89 fL (ref 80.0–100.0)
Platelets: 239 10*3/uL (ref 150–400)
RBC: 4.46 MIL/uL (ref 3.87–5.11)
RDW: 13.6 % (ref 11.5–15.5)
WBC: 5.5 10*3/uL (ref 4.0–10.5)
nRBC: 0 % (ref 0.0–0.2)

## 2020-11-02 LAB — POC URINE PREG, ED: Preg Test, Ur: NEGATIVE

## 2020-11-02 LAB — LIPASE, BLOOD: Lipase: 22 U/L (ref 11–51)

## 2020-11-02 MED ORDER — LIDOCAINE VISCOUS HCL 2 % MT SOLN
15.0000 mL | Freq: Once | OROMUCOSAL | Status: AC
  Administered 2020-11-02: 15 mL via ORAL
  Filled 2020-11-02: qty 15

## 2020-11-02 MED ORDER — FLUCONAZOLE 150 MG PO TABS: 150.0000 mg | ORAL_TABLET | Freq: Once | ORAL | 1 refills | Status: DC

## 2020-11-02 MED ORDER — PANTOPRAZOLE SODIUM 20 MG PO TBEC: 20.0000 mg | DELAYED_RELEASE_TABLET | Freq: Every day | ORAL | 1 refills | Status: AC

## 2020-11-02 MED ORDER — FLUCONAZOLE 150 MG PO TABS: 150.0000 mg | ORAL_TABLET | Freq: Once | ORAL | 1 refills | Status: AC

## 2020-11-02 MED ORDER — ALUM & MAG HYDROXIDE-SIMETH 200-200-20 MG/5ML PO SUSP
30.0000 mL | Freq: Once | ORAL | Status: AC
  Administered 2020-11-02: 30 mL via ORAL
  Filled 2020-11-02: qty 30

## 2020-11-02 NOTE — ED Triage Notes (Signed)
PT to ER with c/o lower abdominal pain and nausea.  States was diagnosed with diverticulitis at [facility] on 12/17.  States she continues to have pain, nausea and is now having rectal bleeding and mucous.  Also reports yeast infection from antibiotics.

## 2020-11-02 NOTE — ED Provider Notes (Signed)
[facility] Emergency Department Provider Note   ____________________________________________    I have reviewed the triage vital signs and the nursing notes.   HISTORY  Chief Complaint Abdominal Pain     HPI Jody Chavez is a 40 y.o. female with history of diabetes, IBS who presents with complaints of periumbilical abdominal pain.  Patient reports this is been ongoing for most of the month of December.  She was actually seen at [facility] emergency department for this and had a CT scan which demonstrated diverticulosis but no acute diverticulitis, she was treated with antibiotics which she says has not helped but has given her yeast infection.  Denies fevers or chills.  Has had her gallbladder removed.  Past Medical History:  Diagnosis Date  . Anxiety   . Depressed   . Diabetes mellitus without complication ([place])   . Ectopic pregnancy   . Hypertension   . IBS (irritable bowel syndrome)   . Paroxysmal SVT (supraventricular tachycardia) (HCC) WPW  . Wolff-Parkinson-White (WPW) syndrome     Patient Active Problem List   Diagnosis Date Noted  . DKA (diabetic ketoacidoses) 11/01/2018  . Generalized abdominal pain 04/06/2017  . DKA (diabetic ketoacidosis) ([place]) 11/03/2016  . Difficulty in swallowing 11/20/2015    Past Surgical History:  Procedure Laterality Date  . ABDOMINAL SURGERY    . CARDIAC ELECTROPHYSIOLOGY MAPPING AND ABLATION    . CESAREAN SECTION    . CHOLECYSTECTOMY    . DIAGNOSTIC LAPAROSCOPY WITH REMOVAL OF ECTOPIC PREGNANCY Right 03/04/2016   Procedure: DIAGNOSTIC LAPAROSCOPY WITH right salpingectomy and REMOVAL OF ECTOPIC PREGNANCY, extensive lysis of adhesions;  Surgeon: Boykin Nearing, MD;  Location: [facility] ORS;  Service: Gynecology;  Laterality: Right;  . ENDOMETRIAL ABLATION    . HEART ABLATION      Prior to Admission medications   Medication Sig Start Date End Date Taking? Authorizing Provider  pantoprazole (PROTONIX) 20 MG  tablet Take 1 tablet (20 mg total) by mouth daily. 11/02/20 11/02/21 Yes Lavonia Drafts, MD  ARIPiprazole (ABILIFY) 10 MG tablet Take 10 mg by mouth daily.    [provider]  cyclobenzaprine (FLEXERIL) 5 MG tablet Take 1 tablet (5 mg total) by mouth 3 (three) times daily as needed for muscle spasms. 07/04/20   Blake Divine, MD  escitalopram (LEXAPRO) 20 MG tablet Take 20 mg by mouth daily.    [provider]  fluticasone (FLONASE) 50 MCG/ACT nasal spray Place 2 sprays into both nostrils daily. 09/25/20   Melynda Ripple, MD  gabapentin (NEURONTIN) 300 MG capsule Take 1 capsule (300 mg total) by mouth 3 (three) times daily. 07/10/20   Coral Spikes, DO  ibuprofen (ADVIL) 600 MG tablet Take 1 tablet (600 mg total) by mouth every 6 (six) hours as needed. 09/25/20   Melynda Ripple, MD  Insulin Aspart (NOVOLOG FLEXPEN [place]) Inject into the skin.    [provider]  insulin detemir (LEVEMIR) 100 UNIT/ML injection Inject 36 Units into the skin daily.    [provider]  Insulin Syringe-Needle U-100 (INSULIN SYRINGE .3CC/31GX5/16") 31G X 5/16" 0.3 ML MISC 5 times daily as directed. 11/04/16   Henreitta Leber, MD  lidocaine (LIDODERM) 5 % Place 1 patch onto the skin every 12 (twelve) hours. Remove & Discard patch within 12 hours or as directed by MD 02/15/20 02/14/21  Sable Feil, PA-C  lisinopril (PRINIVIL,ZESTRIL) 40 MG tablet Take 1 tablet (40 mg total) by mouth daily. 11/02/18   Dustin Flock, MD  Allergies Iodine, Shellfish allergy, and Penicillins  Family History  Problem Relation Age of Onset  . Gallbladder disease Mother     Social History Social History   Tobacco Use  . Smoking status: Current Every Day Smoker    Packs/day: 1.00    Types: Cigarettes  . Smokeless tobacco: Never Used  Vaping Use  . Vaping Use: Never used  Substance Use Topics  . Alcohol use: Yes    Comment: socially  . Drug use: No    Review of Systems  Constitutional: No  fever/chills Eyes: No visual changes.  ENT: No sore throat. Cardiovascular: Denies chest pain. Respiratory: Denies shortness of breath. Gastrointestinal: As above Genitourinary: Negative for dysuria. Musculoskeletal: Negative for back pain. Skin: Negative for rash. Neurological: Negative for headaches    ____________________________________________   PHYSICAL EXAM:  VITAL SIGNS: ED Triage Vitals  Enc Vitals Group     BP 11/02/20 1152 (!) 167/102     Pulse Rate 11/02/20 1152 (!) 102     Resp 11/02/20 1152 18     Temp 11/02/20 1152 98.3 F (36.8 C)     Temp Source 11/02/20 1152 Oral     SpO2 11/02/20 1152 100 %     Weight 11/02/20 1217 90.3 kg (199 lb)     Height 11/02/20 1217 1.6 m (5\' 3" )     Head Circumference --      Peak Flow --      Pain Score 11/02/20 1217 8     Pain Loc --      Pain Edu? --      Excl. in GC? --     Constitutional: Alert and oriented.   Nose: No congestion/rhinnorhea. Mouth/Throat: Mucous membranes are moist.   Cardiovascular: Normal rate, regular rhythm. Grossly normal heart sounds.  Good peripheral circulation. Respiratory: Normal respiratory effort.  No retractions. Lungs CTAB. Gastrointestinal: Mild periumbilical tenderness to palpation, soft.  No distention.  No CVA tenderness.  Musculoskeletal: No lower extremity tenderness nor edema.  Warm and well perfused Neurologic:  Normal speech and language. No gross focal neurologic deficits are appreciated.  Skin:  Skin is warm, dry and intact. No rash noted. Psychiatric: Mood and affect are normal. Speech and behavior are normal.  ____________________________________________   LABS (all labs ordered are listed, but only abnormal results are displayed)  Labs Reviewed  COMPREHENSIVE METABOLIC PANEL - Abnormal; Notable for the following components:      Result Value   Glucose, Bld 199 (*)    Calcium 8.5 (*)    All other components within normal limits  URINALYSIS, COMPLETE (UACMP) WITH  MICROSCOPIC - Abnormal; Notable for the following components:   Color, Urine RED (*)    APPearance CLOUDY (*)    Glucose, UA >=500 (*)    Hgb urine dipstick LARGE (*)    Protein, ur 100 (*)    Leukocytes,Ua TRACE (*)    RBC / HPF >50 (*)    Bacteria, UA RARE (*)    All other components within normal limits  LIPASE, BLOOD  CBC  POC URINE PREG, ED   ____________________________________________  EKG  None ____________________________________________  RADIOLOGY  None ____________________________________________   PROCEDURES  Procedure(s) performed: No  Procedures   Critical Care performed: No ____________________________________________   INITIAL IMPRESSION / ASSESSMENT AND PLAN / ED COURSE  Pertinent labs & imaging results that were available during my care of the patient were reviewed by me and considered in my medical decision making (see chart for details).  Patient overall well-appearing in no acute distress, mild periumbilical abdominal discomfort, mild tenderness in the area.  Suspicious for gastritis, possibility for enteritis, gastroparesis, diverticulitis.  Lab work is overall quite reassuring, not consistent with pancreatitis.  Will trial GI cocktail and reevaluate.  Patient has significant improvement after GI cocktail, reports symptoms have nearly resolved.  Most consistent with gastritis, will start the patient on Protonix, she is also requested a Covid swab for her job.    ____________________________________________   FINAL CLINICAL IMPRESSION(S) / ED DIAGNOSES  Final diagnoses:  Acute gastritis without hemorrhage, unspecified gastritis type        Note:  This document was prepared using Dragon voice recognition software and may include unintentional dictation errors.   Jene Every, MD 11/02/20 1349

## 2020-11-03 ENCOUNTER — Telehealth: Payer: Self-pay

## 2020-11-03 LAB — SARS CORONAVIRUS 2 (TAT 6-24 HRS): SARS Coronavirus 2: POSITIVE — AB

## 2020-11-03 NOTE — Telephone Encounter (Signed)
Patient called stating that she was in the UC and sent to ER yesterday sick and she had her BP elevated.  She states that they tested her blood and sent her home.  She called because she has now received her positive COVID-19 test result via My Chart. Symptom tiers and treatment plans were read to patient. Criteria for ening isolation were read to patient.  Quarantine recommendations for others living in her home were discussed.  Good preventative practices were discussed. Patient was advised to reach out to her PCP office today for advice on her BP issues and to report positive COVID-19 result.  She verbalized understanding of all information. She agrees to follow plan of care. HD notified

## 2020-11-28 ENCOUNTER — Ambulatory Visit (INDEPENDENT_AMBULATORY_CARE_PROVIDER_SITE_OTHER): Payer: BC Managed Care – PPO

## 2020-11-28 ENCOUNTER — Encounter: Payer: Self-pay | Admitting: Emergency Medicine

## 2020-11-28 ENCOUNTER — Other Ambulatory Visit: Payer: Self-pay

## 2020-11-28 ENCOUNTER — Ambulatory Visit
Admission: EM | Admit: 2020-11-28 | Discharge: 2020-11-28 | Disposition: A | Discharge status: Home / Self Care | Payer: BC Managed Care – PPO | Attending: Family Medicine | Admitting: Family Medicine

## 2020-11-28 DIAGNOSIS — Z79899 Other long term (current) drug therapy: Secondary | ICD-10-CM | POA: Insufficient documentation

## 2020-11-28 DIAGNOSIS — F17210 Nicotine dependence, cigarettes, uncomplicated: Secondary | ICD-10-CM | POA: Insufficient documentation

## 2020-11-28 DIAGNOSIS — J988 Other specified respiratory disorders: Secondary | ICD-10-CM

## 2020-11-28 DIAGNOSIS — R519 Headache, unspecified: Secondary | ICD-10-CM | POA: Diagnosis present

## 2020-11-28 DIAGNOSIS — R509 Fever, unspecified: Secondary | ICD-10-CM

## 2020-11-28 DIAGNOSIS — R0981 Nasal congestion: Secondary | ICD-10-CM | POA: Diagnosis not present

## 2020-11-28 DIAGNOSIS — R112 Nausea with vomiting, unspecified: Secondary | ICD-10-CM | POA: Diagnosis not present

## 2020-11-28 DIAGNOSIS — Z794 Long term (current) use of insulin: Secondary | ICD-10-CM | POA: Diagnosis not present

## 2020-11-28 DIAGNOSIS — Z8616 Personal history of COVID-19: Secondary | ICD-10-CM | POA: Insufficient documentation

## 2020-11-28 DIAGNOSIS — Z888 Allergy status to other drugs, medicaments and biological substances status: Secondary | ICD-10-CM | POA: Diagnosis not present

## 2020-11-28 DIAGNOSIS — E1165 Type 2 diabetes mellitus with hyperglycemia: Secondary | ICD-10-CM | POA: Diagnosis not present

## 2020-11-28 DIAGNOSIS — Z880 Allergy status to penicillin: Secondary | ICD-10-CM | POA: Diagnosis not present

## 2020-11-28 LAB — COMPREHENSIVE METABOLIC PANEL
ALT: 25 U/L (ref 0–44)
AST: 29 U/L (ref 15–41)
Albumin: 4.5 g/dL (ref 3.5–5.0)
Alkaline Phosphatase: 61 U/L (ref 38–126)
Anion gap: 12 (ref 5–15)
BUN: 16 mg/dL (ref 6–20)
CO2: 21 mmol/L — ABNORMAL LOW (ref 22–32)
Calcium: 9.2 mg/dL (ref 8.9–10.3)
Chloride: 97 mmol/L — ABNORMAL LOW (ref 98–111)
Creatinine, Ser: 0.92 mg/dL (ref 0.44–1.00)
GFR, Estimated: >60 mL/min (ref >60)
Glucose, Bld: 377 mg/dL — ABNORMAL HIGH (ref 70–99)
Potassium: 4 mmol/L (ref 3.5–5.1)
Sodium: 130 mmol/L — ABNORMAL LOW (ref 135–145)
Total Bilirubin: 1.2 mg/dL (ref 0.3–1.2)
Total Protein: 8.3 g/dL — ABNORMAL HIGH (ref 6.5–8.1)

## 2020-11-28 LAB — CBC WITH DIFFERENTIAL/PLATELET
Abs Immature Granulocytes: 0.05 10*3/uL (ref 0.00–0.07)
Basophils Absolute: 0 10*3/uL (ref 0.0–0.1)
Basophils Relative: 0 %
Eosinophils Absolute: 0 10*3/uL (ref 0.0–0.5)
Eosinophils Relative: 0 %
HCT: 42.9 % (ref 36.0–46.0)
Hemoglobin: 14.5 g/dL (ref 12.0–15.0)
Immature Granulocytes: 1 %
Lymphocytes Relative: 9 %
Lymphs Abs: 0.9 10*3/uL (ref 0.7–4.0)
MCH: 29.8 pg (ref 26.0–34.0)
MCHC: 33.8 g/dL (ref 30.0–36.0)
MCV: 88.3 fL (ref 80.0–100.0)
Monocytes Absolute: 0.5 10*3/uL (ref 0.1–1.0)
Monocytes Relative: 5 %
Neutro Abs: 8.8 10*3/uL — ABNORMAL HIGH (ref 1.7–7.7)
Neutrophils Relative %: 85 %
Platelets: 227 10*3/uL (ref 150–400)
RBC: 4.86 MIL/uL (ref 3.87–5.11)
RDW: 13.2 % (ref 11.5–15.5)
WBC: 10.3 10*3/uL (ref 4.0–10.5)
nRBC: 0 % (ref 0.0–0.2)

## 2020-11-28 LAB — RAPID INFLUENZA A&B ANTIGENS
Influenza A (ARMC): NEGATIVE
Influenza B (ARMC): NEGATIVE

## 2020-11-28 LAB — LIPASE, BLOOD: Lipase: 25 U/L (ref 11–51)

## 2020-11-28 MED ORDER — DOXYCYCLINE HYCLATE 100 MG PO CAPS: 100.0000 mg | ORAL_CAPSULE | Freq: Two times a day (BID) | ORAL | 0 refills | Status: DC

## 2020-11-28 NOTE — ED Provider Notes (Signed)
[facility]    CSN: [id number] Arrival date & time: 11/28/20  1125      History   Chief Complaint Chief Complaint  Patient presents with  . Headache  . Sinus Problem   HPI  40 year old female presents with multiple complaints.  Patient reports that she recently had COVID-19. Electronic medical record reviewed. Patient diagnosed with COVID on 1/3. Patient states that she seemed to get better but has been feeling poorly over the past 2 days. Reports nausea, vomiting, congestion, headache, cough. Blood sugar is elevated. Patient has uncontrolled diabetes. No documented fever. Patient also notes sinus pressure. No relieving factors. Pain 7/10 in severity. No other complaints.  Past Medical History:  Diagnosis Date  . Anxiety   . Depressed   . Diabetes mellitus without complication (HCC)   . Ectopic pregnancy   . Hypertension   . IBS (irritable bowel syndrome)   . Paroxysmal SVT (supraventricular tachycardia) (HCC) WPW  . Wolff-Parkinson-White (WPW) syndrome     Patient Active Problem List   Diagnosis Date Noted  . DKA (diabetic ketoacidoses) 11/01/2018  . Generalized abdominal pain 04/06/2017  . DKA (diabetic ketoacidosis) (HCC) 11/03/2016  . Difficulty in swallowing 11/20/2015    Past Surgical History:  Procedure Laterality Date  . ABDOMINAL SURGERY    . CARDIAC ELECTROPHYSIOLOGY MAPPING AND ABLATION    . CESAREAN SECTION    . CHOLECYSTECTOMY    . DIAGNOSTIC LAPAROSCOPY WITH REMOVAL OF ECTOPIC PREGNANCY Right 03/04/2016   Procedure: DIAGNOSTIC LAPAROSCOPY WITH right salpingectomy and REMOVAL OF ECTOPIC PREGNANCY, extensive lysis of adhesions;  Surgeon: Suzy Bouchard, MD;  Location: [facility] ORS;  Service: Gynecology;  Laterality: Right;  . ENDOMETRIAL ABLATION    . HEART ABLATION      OB History   No obstetric history on file.      Home Medications    Prior to Admission medications   Medication Sig Start Date End Date Taking? Authorizing  Provider  ARIPiprazole (ABILIFY) 10 MG tablet Take 10 mg by mouth daily.   Yes [provider]  doxycycline (VIBRAMYCIN) 100 MG capsule Take 1 capsule (100 mg total) by mouth 2 (two) times daily. 11/28/20  Yes Shya Kovatch G, DO  escitalopram (LEXAPRO) 20 MG tablet Take 20 mg by mouth daily.   Yes [provider]  gabapentin (NEURONTIN) 300 MG capsule Take 1 capsule (300 mg total) by mouth 3 (three) times daily. 07/10/20  Yes Gena Laski G, DO  Insulin Aspart (NOVOLOG FLEXPEN [place]) Inject into the skin.   Yes [provider]  insulin detemir (LEVEMIR) 100 UNIT/ML injection Inject 36 Units into the skin daily.   Yes [provider]  lisinopril (PRINIVIL,ZESTRIL) 40 MG tablet Take 1 tablet (40 mg total) by mouth daily. 11/02/18  Yes Auburn Bilberry, MD  pantoprazole (PROTONIX) 20 MG tablet Take 1 tablet (20 mg total) by mouth daily. 11/02/20 11/02/21 Yes Jene Every, MD  cyclobenzaprine (FLEXERIL) 5 MG tablet Take 1 tablet (5 mg total) by mouth 3 (three) times daily as needed for muscle spasms. 07/04/20   Chesley Noon, MD  fluticasone (FLONASE) 50 MCG/ACT nasal spray Place 2 sprays into both nostrils daily. 09/25/20   Domenick Gong, MD  ibuprofen (ADVIL) 600 MG tablet Take 1 tablet (600 mg total) by mouth every 6 (six) hours as needed. 09/25/20   Domenick Gong, MD  Insulin Syringe-Needle U-100 (INSULIN SYRINGE .3CC/31GX5/16") 31G X 5/16" 0.3 ML MISC 5 times daily as directed. 11/04/16   Houston Siren, MD  lidocaine (LIDODERM) 5 % Place 1 patch onto the skin every 12 (twelve) hours. Remove & Discard patch within 12 hours or as directed by MD 02/15/20 02/14/21  Joni Reining, PA-C    Family History Family History  Problem Relation Age of Onset  . Gallbladder disease Mother     Social History Social History   Tobacco Use  . Smoking status: Current Every Day Smoker    Packs/day: 1.00    Types: Cigarettes  . Smokeless tobacco: Never Used  Vaping Use  .  Vaping Use: Never used  Substance Use Topics  . Alcohol use: Yes    Comment: socially  . Drug use: No     Allergies   Iodine, Shellfish allergy, and Penicillins   Review of Systems Review of Systems Per HPI  Physical Exam Triage Vital Signs ED Triage Vitals  Enc Vitals Group     BP 11/28/20 1139 136/88     Pulse Rate 11/28/20 1139 (!) 120     Resp 11/28/20 1139 14     Temp 11/28/20 1139 98.1 F (36.7 C)     Temp Source 11/28/20 1139 Oral     SpO2 11/28/20 1139 98 %     Weight 11/28/20 1136 199 lb (90.3 kg)     Height 11/28/20 1136 5\' 3"  (1.6 m)     Head Circumference --      Peak Flow --      Pain Score 11/28/20 1136 7     Pain Loc --      Pain Edu? --      Excl. in GC? --    No data found.  Updated Vital Signs BP 136/88 (BP Location: Left Arm)   Pulse (!) 120   Temp 98.1 F (36.7 C) (Oral)   Resp 14   Ht 5\' 3"  (1.6 m)   Wt 90.3 kg   LMP 11/07/2020 (Approximate)   SpO2 98%   BMI 35.25 kg/m   Visual Acuity Right Eye Distance:   Left Eye Distance:   Bilateral Distance:    Right Eye Near:   Left Eye Near:    Bilateral Near:     Physical Exam Constitutional:      General: She is not in acute distress.    Appearance: Normal appearance. She is obese.  HENT:     Head: Normocephalic and atraumatic.  Eyes:     General:        Right eye: No discharge.        Left eye: No discharge.     Conjunctiva/sclera: Conjunctivae normal.  Cardiovascular:     Rate and Rhythm: Normal rate and regular rhythm.  Pulmonary:     Effort: Pulmonary effort is normal.     Breath sounds: Normal breath sounds. No wheezing, rhonchi or rales.  Neurological:     Mental Status: She is alert.  Psychiatric:        Mood and Affect: Mood normal.        Behavior: Behavior normal.    UC Treatments / Results  Labs (all labs ordered are listed, but only abnormal results are displayed) Labs Reviewed  CBC WITH DIFFERENTIAL/PLATELET - Abnormal; Notable for the following  components:      Result Value   Neutro Abs 8.8 (*)    All other components within normal limits  COMPREHENSIVE METABOLIC PANEL - Abnormal; Notable for the following components:   Sodium 130 (*)    Chloride 97 (*)    CO2 21 (*)  Glucose, Bld 377 (*)    Total Protein 8.3 (*)    All other components within normal limits  RAPID INFLUENZA A&B ANTIGENS  LIPASE, BLOOD    EKG   Radiology DG Chest 2 View  Result Date: 11/28/2020 CLINICAL DATA:  Headache. Low-grade fever. Sinus pressure and congestion. EXAM: CHEST - 2 VIEW COMPARISON:  11/26/2018 FINDINGS: Normal sized heart. Clear lungs. Normal appearing bones. Cholecystectomy clips. IMPRESSION: No acute abnormality. Electronically Signed   By: Beckie Salts M.D.   On: 11/28/2020 12:09    Procedures Procedures (including critical care time)  Medications Ordered in UC Medications - No data to display  Initial Impression / Assessment and Plan / UC Course  I have reviewed the triage vital signs and the nursing notes.  Pertinent labs & imaging results that were available during my care of the patient were reviewed by me and considered in my medical decision making (see chart for details).    40 year old female presents with respiratory infection. Patient is status post recent COVID-19. I am concerned for superimposed bacterial infection. Placing on doxycycline. Her laboratory work-up was remarkable for hyperglycemia. Otherwise unremarkable. Chest x-ray was obtained and was independent reviewed by me. Interpretation: Normal chest x-ray.  Final Clinical Impressions(s) / UC Diagnoses   Final diagnoses:  Respiratory infection     Discharge Instructions     Medication as prescribed.  Labs look okay. Be sure to get your insulin and take as prescribed.  Take care  Dr. Adriana Simas    ED Prescriptions    Medication Sig Dispense Auth. Provider   doxycycline (VIBRAMYCIN) 100 MG capsule Take 1 capsule (100 mg total) by mouth 2 (two) times  daily. 14 capsule Everlene Other G, DO     PDMP not reviewed this encounter.   Tommie Sams, [place] 11/28/20 1631

## 2020-11-28 NOTE — Discharge Instructions (Signed)
Medication as prescribed.  Labs look okay. Be sure to get your insulin and take as prescribed.  Take care  Dr. Adriana Simas

## 2020-11-28 NOTE — ED Triage Notes (Signed)
Patient c/o headache, sinus pressure and congestion and low grade fever that started 2 days ago.  Patient states that she had COVID at the earlier of this month.

## 2021-03-18 ENCOUNTER — Encounter: Payer: Self-pay | Admitting: Emergency Medicine

## 2021-03-18 ENCOUNTER — Other Ambulatory Visit: Payer: Self-pay

## 2021-03-18 ENCOUNTER — Ambulatory Visit
Admission: EM | Admit: 2021-03-18 | Discharge: 2021-03-18 | Disposition: A | Discharge status: Home / Self Care | Payer: Commercial Managed Care - PPO | Attending: Sports Medicine | Admitting: Sports Medicine

## 2021-03-18 DIAGNOSIS — M5412 Radiculopathy, cervical region: Secondary | ICD-10-CM

## 2021-03-18 DIAGNOSIS — M542 Cervicalgia: Secondary | ICD-10-CM | POA: Diagnosis not present

## 2021-03-18 DIAGNOSIS — Z8659 Personal history of other mental and behavioral disorders: Secondary | ICD-10-CM

## 2021-03-18 DIAGNOSIS — M5441 Lumbago with sciatica, right side: Secondary | ICD-10-CM

## 2021-03-18 DIAGNOSIS — G8929 Other chronic pain: Secondary | ICD-10-CM

## 2021-03-18 DIAGNOSIS — F411 Generalized anxiety disorder: Secondary | ICD-10-CM

## 2021-03-18 DIAGNOSIS — G894 Chronic pain syndrome: Secondary | ICD-10-CM | POA: Diagnosis not present

## 2021-03-18 MED ORDER — KETOROLAC TROMETHAMINE 60 MG/2ML IM SOLN
60.0000 mg | Freq: Once | INTRAMUSCULAR | Status: AC
  Administered 2021-03-18: 60 mg via INTRAMUSCULAR

## 2021-03-18 NOTE — Discharge Instructions (Addendum)
As we discussed, we are not set up in the urgent care setting to do an MRI for your pain.  It appears as though you are already hooked into the system with neurosurgery, physical medicine and rehab, as well as pain management.  You have also seen orthopedics.  I would recommend that you get back in and see these physicians and have your pain situation addressed. I gave you a medication in the office that should help with your pain until you can see your primary care provider. I wanted to give you prednisone, but your type 1 diabetes seems to be not well controlled.  This would not be good for your blood sugars.  I understand that you have had to cancel several of your epidural injections because of your blood sugar issues.  You need to discuss this further with your primary care provider as this is the mainstay of nonoperative management. Another option would be for you to reengage with your neurosurgeon to see whether or not surgery would be indicated. If your situation becomes untenable then the next step would be to go to the emergency room and be evaluated. Continue with your outpatient medical management.  You have an appointment with your primary care provider in the next few hours and you can discuss changing that up or adding to your medical management.

## 2021-03-18 NOTE — ED Triage Notes (Signed)
Pt c/o back pain, right shoulder and right hand. Started about 2 weeks ago. She states she has chronic back problems and pinched nerve and rotator cuff tear in her right shoulder. She states it is her entire spine hurts. She states she used to get injections in her back but she has not been able to get those because her blood sugar has been elevated.

## 2021-03-18 NOTE — ED Provider Notes (Signed)
[facility]    CSN: [id number] Arrival date & time: 03/18/21  1153      History   Chief Complaint Chief Complaint  Patient presents with  . Back Pain    HPI Jody Chavez is a 40 y.o. female.   Patient is a 40 year old female who presents for evaluation of the above issues.  Her chart lists her getting her primary care needs met by [facility].  She tells me that she has an appointment with her primary care provider in [place] in 2 hours.  She reports that she wanted to come to the urgent care and get x-rays because she says "I think there is something wrong".  She denies any accidents trauma falls or any mechanism of injury that would necessitate an x-ray.  There is no concern for a fracture.  I have reviewed her chart in detail.  She states that she is only had her symptoms for 2 weeks.  Review of her chart indicates that she has had ongoing problems with her neck and her back for quite some time.  She has been seen by physical medicine and rehabilitation.  She is also been seen by orthopedics.  She has been getting some injections for her chronic spine issues but has not been able to get them recently because of poorly controlled diabetes.  She does take insulin.  She is not able to take prednisone either.  She states that her symptoms began after she had her second COVID-vaccine in April 2021.  At that time she developed some left arm swelling and bilateral upper extremity pain.  She is quite tearful at times throughout the history and the physical exam.  She says her pain is quite severe and associates it with throbbing, sharpness in quality, with numbness and tingling and weakness at times.  She has had EMG and nerve conduction studies that did show some evidence of carpal tunnel syndrome.  I reviewed her chart in quite some detail.  She denies any incontinence of bowel or bladder.  Extensive review of systems and history reveals no concerning red  flag signs or symptoms.        Past Medical History:  Diagnosis Date  . Anxiety   . Depressed   . Diabetes mellitus without complication ([place])   . Ectopic pregnancy   . Hypertension   . IBS (irritable bowel syndrome)   . Paroxysmal SVT (supraventricular tachycardia) (HCC) WPW  . Wolff-Parkinson-White (WPW) syndrome     Patient Active Problem List   Diagnosis Date Noted  . DKA (diabetic ketoacidoses) 11/01/2018  . Generalized abdominal pain 04/06/2017  . DKA (diabetic ketoacidosis) ([place]) 11/03/2016  . Difficulty in swallowing 11/20/2015    Past Surgical History:  Procedure Laterality Date  . ABDOMINAL SURGERY    . CARDIAC ELECTROPHYSIOLOGY MAPPING AND ABLATION    . CESAREAN SECTION    . CHOLECYSTECTOMY    . DIAGNOSTIC LAPAROSCOPY WITH REMOVAL OF ECTOPIC PREGNANCY Right 03/04/2016   Procedure: DIAGNOSTIC LAPAROSCOPY WITH right salpingectomy and REMOVAL OF ECTOPIC PREGNANCY, extensive lysis of adhesions;  Surgeon: Boykin Nearing, MD;  Location: [facility] ORS;  Service: Gynecology;  Laterality: Right;  . ENDOMETRIAL ABLATION    . HEART ABLATION      OB History   No obstetric history on file.      Home Medications    Prior to Admission medications   Medication Sig Start Date End Date Taking? Authorizing Provider  amLODipine (NORVASC) 10 MG tablet  08/10/20  Yes [provider]  ARIPiprazole (ABILIFY) 10 MG tablet Take 10 mg by mouth daily.   Yes [provider]  cyclobenzaprine (FLEXERIL) 5 MG tablet Take 1 tablet (5 mg total) by mouth 3 (three) times daily as needed for muscle spasms. 07/04/20  Yes Blake Divine, MD  gabapentin (NEURONTIN) 300 MG capsule Take 1 capsule (300 mg total) by mouth 3 (three) times daily. 07/10/20  Yes Cook, Jayce G, DO  Insulin Aspart (NOVOLOG FLEXPEN [place]) Inject into the skin.   Yes [provider]  insulin detemir (LEVEMIR) 100 UNIT/ML injection Inject 36 Units into the skin daily.   Yes [provider]   Insulin Syringe-Needle U-100 (INSULIN SYRINGE .3CC/31GX5/16") 31G X 5/16" 0.3 ML MISC 5 times daily as directed. 11/04/16  Yes Henreitta Leber, MD  lisinopril (PRINIVIL,ZESTRIL) 40 MG tablet Take 1 tablet (40 mg total) by mouth daily. 11/02/18  Yes Dustin Flock, MD  [facility] 17 GM/SCOOP powder Take 17 g by mouth daily. 03/05/21  Yes [provider]  pantoprazole (PROTONIX) 20 MG tablet Take 1 tablet (20 mg total) by mouth daily. 11/02/20 11/02/21 Yes Lavonia Drafts, MD  SENNA PLUS 50-8.6 MG CAPS Take 2 capsules by mouth at bedtime as needed. 03/05/21  Yes [provider]  doxycycline (VIBRAMYCIN) 100 MG capsule Take 1 capsule (100 mg total) by mouth 2 (two) times daily. 11/28/20   Coral Spikes, DO  escitalopram (LEXAPRO) 20 MG tablet Take 20 mg by mouth daily.    [provider]  fluticasone (FLONASE) 50 MCG/ACT nasal spray Place 2 sprays into both nostrils daily. 09/25/20   Melynda Ripple, MD  ibuprofen (ADVIL) 600 MG tablet Take 1 tablet (600 mg total) by mouth every 6 (six) hours as needed. 09/25/20   Melynda Ripple, MD    Family History Family History  Problem Relation Age of Onset  . Gallbladder disease Mother     Social History Social History   Tobacco Use  . Smoking status: Current Every Day Smoker    Packs/day: 1.00    Types: Cigarettes  . Smokeless tobacco: Never Used  Vaping Use  . Vaping Use: Never used  Substance Use Topics  . Alcohol use: Yes    Comment: socially  . Drug use: No     Allergies   Iodine, Shellfish allergy, and Penicillins   Review of Systems Review of Systems  Constitutional: Positive for activity change. Negative for appetite change, chills, diaphoresis, fatigue and fever.  HENT: Negative for congestion, ear pain, postnasal drip, rhinorrhea, sinus pressure, sinus pain, sneezing and sore throat.   Eyes: Negative for photophobia, pain and visual disturbance.  Respiratory: Negative for cough, chest tightness and shortness  of breath.   Cardiovascular: Negative for chest pain and palpitations.  Gastrointestinal: Negative for abdominal pain, diarrhea, nausea and vomiting.  Genitourinary: Negative for dysuria.  Musculoskeletal: Positive for arthralgias, back pain, myalgias, neck pain and neck stiffness.  Skin: Negative for color change, pallor, rash and wound.  Neurological: Positive for weakness and numbness. Negative for dizziness, seizures, syncope, light-headedness and headaches.  All other systems reviewed and are negative.    Physical Exam Triage Vital Signs ED Triage Vitals  Enc Vitals Group     BP 03/18/21 1257 (!) 126/99     Pulse Rate 03/18/21 1257 (!) 109     Resp 03/18/21 1257 18     Temp 03/18/21 1257 98.4 F (36.9 C)     Temp Source 03/18/21 1257 Oral  SpO2 03/18/21 1257 99 %     Weight 03/18/21 1253 199 lb 1.2 oz (90.3 kg)     Height 03/18/21 1253 5' 3" (1.6 m)     Head Circumference --      Peak Flow --      Pain Score 03/18/21 1252 8     Pain Loc --      Pain Edu? --      Excl. in [place]? --    No data found.  Updated Vital Signs BP (!) 126/99 (BP Location: Left Arm)   Pulse (!) 109   Temp 98.4 F (36.9 C) (Oral)   Resp 18   Ht 5' 3" (1.6 m)   Wt 90.3 kg   LMP 02/16/2021 (Approximate)   SpO2 99%   BMI 35.26 kg/m   Visual Acuity Right Eye Distance:   Left Eye Distance:   Bilateral Distance:    Right Eye Near:   Left Eye Near:    Bilateral Near:     Physical Exam Vitals and nursing note reviewed.  Constitutional:      General: She is not in acute distress.    Appearance: Normal appearance. She is not ill-appearing, toxic-appearing or diaphoretic.  HENT:     Head: Normocephalic and atraumatic.     Nose: Nose normal.     Mouth/Throat:     Mouth: Mucous membranes are moist.  Eyes:     Extraocular Movements: Extraocular movements intact.     Conjunctiva/sclera: Conjunctivae normal.     Pupils: Pupils are equal, round, and reactive to light.  Cardiovascular:      Rate and Rhythm: Normal rate and regular rhythm.     Pulses: Normal pulses.     Heart sounds: Normal heart sounds. No murmur heard. No friction rub. No gallop.   Pulmonary:     Effort: Pulmonary effort is normal.     Breath sounds: Normal breath sounds. No stridor. No wheezing, rhonchi or rales.  Musculoskeletal:     Cervical back: Normal range of motion and neck supple.     Comments: Cervical Exam Upon inspection there are no rashes or scars. She has diffuse tenderness that is mild throughout the cervical paraspinal musculature along with the upper back. Cervical rotation produces bilateral trapezius pain. Spurling's maneuver produces bilateral parascapular and trapezius pain.  Upper Extremity Exam She has 5/5 strength in bilateral wrist extensors, biceps, triceps, deltoids, shoulder internal and external rotators. Sensation intact to light touch bilaterally. She has +2 bilateral bicep, tricep and brachioradialis reflexes. Empty can test and Hawkins test produces mild bilateral shoulder pain. Negative Hoffmann's bilaterally.   Lumbar spine: This is a limited examination given the patient's pain.  She has limitations in range of motion.  She has diffuse tenderness in the lower lumbar area over the spine and the paraspinous muscles.  There is no spasm.  She has diffuse weakness in all planes but this is somewhat effort dependent.  She has 2+ deep tendon reflexes bilateral lower extremities.   Skin:    General: Skin is warm and dry.     Capillary Refill: Capillary refill takes less than 2 seconds.  Neurological:     General: No focal deficit present.     Mental Status: She is alert and oriented to person, place, and time.     GCS: GCS eye subscore is 4. GCS verbal subscore is 5. GCS motor subscore is 6.     Motor: Motor function is intact.     Coordination:  Coordination is intact.     Gait: Gait is intact.     Deep Tendon Reflexes:     Reflex Scores:      Tricep reflexes are 2+ on the  right side and 2+ on the left side.      Bicep reflexes are 2+ on the right side and 2+ on the left side.      Brachioradialis reflexes are 2+ on the right side and 2+ on the left side.      Patellar reflexes are 2+ on the right side and 2+ on the left side.      Achilles reflexes are 2+ on the right side and 2+ on the left side. Psychiatric:        Mood and Affect: Mood is anxious. Affect is tearful.        Behavior: Behavior is aggressive.      UC Treatments / Results  Labs (all labs ordered are listed, but only abnormal results are displayed) Labs Reviewed - No data to display  EKG   Radiology No results found.  Procedures Procedures (including critical care time)  Medications Ordered in UC Medications  ketorolac (TORADOL) injection 60 mg (60 mg Intramuscular Given 03/18/21 1329)    Initial Impression / Assessment and Plan / UC Course  I have reviewed the triage vital signs and the nursing notes.  Pertinent labs & imaging results that were available during my care of the patient were reviewed by me and considered in my medical decision making (see chart for details).  Clinical impression: 1.  Chronic right-sided low back pain with right-sided sciatica.  Limited examination secondary to patient's perceived pain. 2.  Chronic neck pain being followed by physical medicine and rehabilitation. 3.  History of anxiety 4.  History of panic attacks 5.  History of depression 6.  History of uncontrolled diabetes requiring insulin and not able to take steroids due to elevation in her blood sugars  Treatment plan: 1.  I explained the findings and the treatment plan to the patient.  She was not necessarily in agreement.  She was perseverating on the fact that she needed x-rays because she hurts so badly.  I explained to her there was no medical indication to do x-rays at this time as she had no trauma. 2.  Oral steroids certainly would help her but with her blood sugars the way they  are we could not do that. 3.  Ordered and dispensed 60 mg of IM Toradol in the office. 4.  She will continue with the medications prescribed by her primary care provider and her multiple subspecialists. 5.  I indicated to her that her presentation was consistent with chronic medical issues that could not be managed appropriately and in urgent care setting.  While disappointed, she did understand eventually.  I asked her to make follow-up appointments with her neurosurgeon, physical medicine and rehab physician, pain management, and orthopedics to address these issues. 6.  She reported that she was on her way to [facility] to see her primary care provider and I encouraged her to go ahead and keep that appointment and discussed the options. 7.  I also indicated that if her symptoms became untenable then she needed to go to the emergency room to be evaluated. 8.  She was discharged from care in stable condition and she will follow-up here as needed.    Final Clinical Impressions(s) / UC Diagnoses   Final diagnoses:  Chronic right-sided low back pain with right-sided  sciatica  Cervical spine pain  Cervical radiculitis  Anxiety state  History of panic disorder  History of depression  Chronic pain syndrome     Discharge Instructions     As we discussed, we are not set up in the urgent care setting to do an MRI for your pain.  It appears as though you are already hooked into the system with neurosurgery, physical medicine and rehab, as well as pain management.  You have also seen orthopedics.  I would recommend that you get back in and see these physicians and have your pain situation addressed. I gave you a medication in the office that should help with your pain until you can see your primary care provider. I wanted to give you prednisone, but your type 1 diabetes seems to be not well controlled.  This would not be good for your blood sugars.  I understand that you have had to cancel several of  your epidural injections because of your blood sugar issues.  You need to discuss this further with your primary care provider as this is the mainstay of nonoperative management. Another option would be for you to reengage with your neurosurgeon to see whether or not surgery would be indicated. If your situation becomes untenable then the next step would be to go to the emergency room and be evaluated. Continue with your outpatient medical management.  You have an appointment with your primary care provider in the next few hours and you can discuss changing that up or adding to your medical management.    ED Prescriptions    None     PDMP not reviewed this encounter.   Verda Cumins, MD 03/20/21 [phone]

## 2021-03-19 NOTE — ED Provider Notes (Incomplete)
[facility]    CSN: [id number] Arrival date & time: 03/18/21  1153      History   Chief Complaint Chief Complaint  Patient presents with  . Back Pain    HPI Jody Chavez is a 40 y.o. female.   Patient is a 40 year old female who presents for evaluation of the above issues.  Her chart lists her getting her primary care needs met by [facility].  She tells me that she has an appointment with her primary care provider in [place] in 2 hours.  She reports that she wanted to come to the urgent care and get x-rays because she says "I think there is something wrong".  She denies any accidents trauma falls or any mechanism of injury that would necessitate an x-ray.  There is no concern for a fracture.  I have reviewed her chart in detail.  She states that she is only had her symptoms for 2 weeks.  Review of her chart indicates that she has had ongoing problems with her neck and her back for quite some time.  She has been seen by physical medicine and rehabilitation.  She is also been seen by orthopedics.  She has been getting some injections for her chronic spine issues but has not been able to get them recently because of poorly controlled diabetes.  She does take insulin.  She is not able to take prednisone either.  She states that her symptoms began after she had her second COVID-vaccine in April 2021.  At that time she developed some left arm swelling and bilateral upper extremity pain.  She is quite tearful at times throughout the history and the physical exam.  She says her pain is quite severe and associates it with throbbing, sharpness in quality, with numbness and tingling and weakness at times.  She has had EMG and nerve conduction studies that did show some evidence of carpal tunnel syndrome.  I reviewed her chart in quite some detail.  She denies any incontinence of bowel or bladder.  Extensive review of systems and history reveals no concerning red  flag signs or symptoms.        Past Medical History:  Diagnosis Date  . Anxiety   . Depressed   . Diabetes mellitus without complication ([place])   . Ectopic pregnancy   . Hypertension   . IBS (irritable bowel syndrome)   . Paroxysmal SVT (supraventricular tachycardia) (HCC) WPW  . Wolff-Parkinson-White (WPW) syndrome     Patient Active Problem List   Diagnosis Date Noted  . DKA (diabetic ketoacidoses) 11/01/2018  . Generalized abdominal pain 04/06/2017  . DKA (diabetic ketoacidosis) ([place]) 11/03/2016  . Difficulty in swallowing 11/20/2015    Past Surgical History:  Procedure Laterality Date  . ABDOMINAL SURGERY    . CARDIAC ELECTROPHYSIOLOGY MAPPING AND ABLATION    . CESAREAN SECTION    . CHOLECYSTECTOMY    . DIAGNOSTIC LAPAROSCOPY WITH REMOVAL OF ECTOPIC PREGNANCY Right 03/04/2016   Procedure: DIAGNOSTIC LAPAROSCOPY WITH right salpingectomy and REMOVAL OF ECTOPIC PREGNANCY, extensive lysis of adhesions;  Surgeon: Boykin Nearing, MD;  Location: [facility] ORS;  Service: Gynecology;  Laterality: Right;  . ENDOMETRIAL ABLATION    . HEART ABLATION      OB History   No obstetric history on file.      Home Medications    Prior to Admission medications   Medication Sig Start Date End Date Taking? Authorizing Provider  amLODipine (NORVASC) 10 MG tablet  08/10/20  Yes [provider]  ARIPiprazole (ABILIFY) 10 MG tablet Take 10 mg by mouth daily.   Yes [provider]  cyclobenzaprine (FLEXERIL) 5 MG tablet Take 1 tablet (5 mg total) by mouth 3 (three) times daily as needed for muscle spasms. 07/04/20  Yes Blake Divine, MD  gabapentin (NEURONTIN) 300 MG capsule Take 1 capsule (300 mg total) by mouth 3 (three) times daily. 07/10/20  Yes Cook, Jayce G, DO  Insulin Aspart (NOVOLOG FLEXPEN [place]) Inject into the skin.   Yes [provider]  insulin detemir (LEVEMIR) 100 UNIT/ML injection Inject 36 Units into the skin daily.   Yes [provider]   Insulin Syringe-Needle U-100 (INSULIN SYRINGE .3CC/31GX5/16") 31G X 5/16" 0.3 ML MISC 5 times daily as directed. 11/04/16  Yes Henreitta Leber, MD  lisinopril (PRINIVIL,ZESTRIL) 40 MG tablet Take 1 tablet (40 mg total) by mouth daily. 11/02/18  Yes Dustin Flock, MD  [facility] 17 GM/SCOOP powder Take 17 g by mouth daily. 03/05/21  Yes [provider]  pantoprazole (PROTONIX) 20 MG tablet Take 1 tablet (20 mg total) by mouth daily. 11/02/20 11/02/21 Yes Lavonia Drafts, MD  SENNA PLUS 50-8.6 MG CAPS Take 2 capsules by mouth at bedtime as needed. 03/05/21  Yes [provider]  doxycycline (VIBRAMYCIN) 100 MG capsule Take 1 capsule (100 mg total) by mouth 2 (two) times daily. 11/28/20   Coral Spikes, DO  escitalopram (LEXAPRO) 20 MG tablet Take 20 mg by mouth daily.    [provider]  fluticasone (FLONASE) 50 MCG/ACT nasal spray Place 2 sprays into both nostrils daily. 09/25/20   Melynda Ripple, MD  ibuprofen (ADVIL) 600 MG tablet Take 1 tablet (600 mg total) by mouth every 6 (six) hours as needed. 09/25/20   Melynda Ripple, MD    Family History Family History  Problem Relation Age of Onset  . Gallbladder disease Mother     Social History Social History   Tobacco Use  . Smoking status: Current Every Day Smoker    Packs/day: 1.00    Types: Cigarettes  . Smokeless tobacco: Never Used  Vaping Use  . Vaping Use: Never used  Substance Use Topics  . Alcohol use: Yes    Comment: socially  . Drug use: No     Allergies   Iodine, Shellfish allergy, and Penicillins   Review of Systems Review of Systems  Constitutional: Positive for activity change. Negative for appetite change, chills, diaphoresis, fatigue and fever.  HENT: Negative for congestion, ear pain, postnasal drip, rhinorrhea, sinus pressure, sinus pain, sneezing and sore throat.   Eyes: Negative for photophobia, pain and visual disturbance.  Respiratory: Negative for cough, chest tightness and shortness  of breath.   Cardiovascular: Negative for chest pain and palpitations.  Gastrointestinal: Negative for abdominal pain, diarrhea, nausea and vomiting.  Genitourinary: Negative for dysuria.  Musculoskeletal: Positive for arthralgias, back pain, myalgias, neck pain and neck stiffness.  Skin: Negative for color change, pallor, rash and wound.  Neurological: Positive for weakness and numbness. Negative for dizziness, seizures, syncope, light-headedness and headaches.  All other systems reviewed and are negative.    Physical Exam Triage Vital Signs ED Triage Vitals  Enc Vitals Group     BP 03/18/21 1257 (!) 126/99     Pulse Rate 03/18/21 1257 (!) 109     Resp 03/18/21 1257 18     Temp 03/18/21 1257 98.4 F (36.9 C)     Temp Source 03/18/21 1257 Oral  SpO2 03/18/21 1257 99 %     Weight 03/18/21 1253 199 lb 1.2 oz (90.3 kg)     Height 03/18/21 1253 5' 3" (1.6 m)     Head Circumference --      Peak Flow --      Pain Score 03/18/21 1252 8     Pain Loc --      Pain Edu? --      Excl. in [place]? --    No data found.  Updated Vital Signs BP (!) 126/99 (BP Location: Left Arm)   Pulse (!) 109   Temp 98.4 F (36.9 C) (Oral)   Resp 18   Ht 5' 3" (1.6 m)   Wt 90.3 kg   LMP 02/16/2021 (Approximate)   SpO2 99%   BMI 35.26 kg/m   Visual Acuity Right Eye Distance:   Left Eye Distance:   Bilateral Distance:    Right Eye Near:   Left Eye Near:    Bilateral Near:     Physical Exam Vitals and nursing note reviewed.  Constitutional:      General: She is not in acute distress.    Appearance: Normal appearance. She is not ill-appearing, toxic-appearing or diaphoretic.  HENT:     Head: Normocephalic and atraumatic.     Nose: Nose normal.     Mouth/Throat:     Mouth: Mucous membranes are moist.  Eyes:     Extraocular Movements: Extraocular movements intact.     Conjunctiva/sclera: Conjunctivae normal.     Pupils: Pupils are equal, round, and reactive to light.  Cardiovascular:      Rate and Rhythm: Normal rate and regular rhythm.     Pulses: Normal pulses.     Heart sounds: Normal heart sounds. No murmur heard. No friction rub. No gallop.   Pulmonary:     Effort: Pulmonary effort is normal.     Breath sounds: Normal breath sounds. No stridor. No wheezing, rhonchi or rales.  Musculoskeletal:     Cervical back: Normal range of motion and neck supple.     Comments: Cervical Exam Upon inspection there are no rashes or scars. She has diffuse tenderness that is mild throughout the cervical paraspinal musculature along with the upper back. Cervical rotation produces bilateral trapezius pain. Spurling's maneuver produces bilateral parascapular and trapezius pain.  Upper Extremity Exam She has 5/5 strength in bilateral wrist extensors, biceps, triceps, deltoids, shoulder internal and external rotators. Sensation intact to light touch bilaterally. She has +2 bilateral bicep, tricep and brachioradialis reflexes. Empty can test and Hawkins test produces mild bilateral shoulder pain. Negative Hoffmann's bilaterally.   Lumbar spine: This is a limited examination given the patient's pain.  She has limitations in range of motion.  She has diffuse tenderness in the lower lumbar area over the spine and the paraspinous muscles.  There is no spasm.  She has diffuse weakness in all planes but this is somewhat effort dependent.  She has 2+ deep tendon reflexes bilateral lower extremities.   Skin:    General: Skin is warm and dry.     Capillary Refill: Capillary refill takes less than 2 seconds.  Neurological:     General: No focal deficit present.     Mental Status: She is alert and oriented to person, place, and time.     GCS: GCS eye subscore is 4. GCS verbal subscore is 5. GCS motor subscore is 6.     Motor: Motor function is intact.     Coordination:  Coordination is intact.     Gait: Gait is intact.     Deep Tendon Reflexes:     Reflex Scores:      Tricep reflexes are 2+ on the  right side and 2+ on the left side.      Bicep reflexes are 2+ on the right side and 2+ on the left side.      Brachioradialis reflexes are 2+ on the right side and 2+ on the left side.      Patellar reflexes are 2+ on the right side and 2+ on the left side.      Achilles reflexes are 2+ on the right side and 2+ on the left side. Psychiatric:        Mood and Affect: Mood is anxious. Affect is tearful.        Behavior: Behavior is aggressive.      UC Treatments / Results  Labs (all labs ordered are listed, but only abnormal results are displayed) Labs Reviewed - No data to display  EKG   Radiology No results found.  Procedures Procedures (including critical care time)  Medications Ordered in UC Medications  ketorolac (TORADOL) injection 60 mg (60 mg Intramuscular Given 03/18/21 1329)    Initial Impression / Assessment and Plan / UC Course  I have reviewed the triage vital signs and the nursing notes.  Pertinent labs & imaging results that were available during my care of the patient were reviewed by me and considered in my medical decision making (see chart for details).  Clinical impression: 1.  Chronic right-sided low back pain with right-sided sciatica.  Limited examination secondary to patient's perceived pain. 2.  Chronic neck pain being followed by physical medicine and rehabilitation. 3.  History of anxiety 4.  History of panic attacks 5.  History of depression 6.  History of uncontrolled diabetes requiring insulin and not able to take steroids due to elevation in her blood sugars  Treatment plan: 1.  I explained the findings and the treatment plan to the patient.  She was not necessarily in agreement.  She was perseverating on the fact that she needed x-rays because she hurts so badly.  I explained to her there was no medical indication to do x-rays at this time as she had no trauma. 2.     *** Final Clinical Impressions(s) / UC Diagnoses   Final diagnoses:   Chronic right-sided low back pain with right-sided sciatica     Discharge Instructions     As we discussed, we are not set up in the urgent care setting to do an MRI for your pain.  It appears as though you are already hooked into the system with neurosurgery, physical medicine and rehab, as well as pain management.  You have also seen orthopedics.  I would recommend that you get back in and see these physicians and have your pain situation addressed. I gave you a medication in the office that should help with your pain until you can see your primary care provider. I wanted to give you prednisone, but your type 1 diabetes seems to be not well controlled.  This would not be good for your blood sugars.  I understand that you have had to cancel several of your epidural injections because of your blood sugar issues.  You need to discuss this further with your primary care provider as this is the mainstay of nonoperative management. Another option would be for you to reengage with your neurosurgeon to see  whether or not surgery would be indicated. If your situation becomes untenable then the next step would be to go to the emergency room and be evaluated. Continue with your outpatient medical management.  You have an appointment with your primary care provider in the next few hours and you can discuss changing that up or adding to your medical management.    ED Prescriptions    None     PDMP not reviewed this encounter.

## 2021-07-17 ENCOUNTER — Other Ambulatory Visit: Payer: Self-pay

## 2021-07-17 ENCOUNTER — Ambulatory Visit
Admission: EM | Admit: 2021-07-17 | Discharge: 2021-07-17 | Disposition: A | Discharge status: Home / Self Care | Payer: Managed Care, Other (non HMO) | Attending: Physician Assistant | Admitting: Physician Assistant

## 2021-07-17 ENCOUNTER — Encounter: Payer: Self-pay | Admitting: Emergency Medicine

## 2021-07-17 DIAGNOSIS — R1033 Periumbilical pain: Secondary | ICD-10-CM | POA: Diagnosis present

## 2021-07-17 DIAGNOSIS — E109 Type 1 diabetes mellitus without complications: Secondary | ICD-10-CM | POA: Insufficient documentation

## 2021-07-17 DIAGNOSIS — R197 Diarrhea, unspecified: Secondary | ICD-10-CM | POA: Insufficient documentation

## 2021-07-17 DIAGNOSIS — Z8719 Personal history of other diseases of the digestive system: Secondary | ICD-10-CM | POA: Diagnosis present

## 2021-07-17 DIAGNOSIS — E1065 Type 1 diabetes mellitus with hyperglycemia: Secondary | ICD-10-CM | POA: Diagnosis not present

## 2021-07-17 DIAGNOSIS — Z794 Long term (current) use of insulin: Secondary | ICD-10-CM

## 2021-07-17 LAB — POCT URINALYSIS DIP (DEVICE)
Bilirubin Urine: NEGATIVE
Glucose, UA: >=1000 mg/dL — AB
Hgb urine dipstick: NEGATIVE
Ketones, ur: 15 mg/dL — AB
Leukocytes,Ua: NEGATIVE
Nitrite: NEGATIVE
Protein, ur: NEGATIVE mg/dL
Specific Gravity, Urine: 1.025 (ref 1.005–1.030)
Urobilinogen, UA: 0.2 mg/dL (ref 0.0–1.0)
pH: 5.5 (ref 5.0–8.0)

## 2021-07-17 LAB — GLUCOSE, CAPILLARY: Glucose-Capillary: 318 mg/dL — ABNORMAL HIGH (ref 70–99)

## 2021-07-17 NOTE — ED Triage Notes (Signed)
Patient reports diarrhea, abdominal pain and headaches for the past 5 days.  Patient denies fevers.

## 2021-07-17 NOTE — Discharge Instructions (Addendum)
-  Your blood sugar is high at 318 and you have ketones in the urine. -You have been advised to go to the emergency department this time for labs, fluids and CT scan.  High suspicion for diverticulitis  You have been advised to follow up immediately in the emergency department for concerning signs.symptoms. If you declined EMS transport, please have a family member take you directly to the ED at this time. Do not delay. Based on concerns about condition, if you do not follow up in th e ED, you may risk poor outcomes including worsening of condition, delayed treatment and potentially life threatening issues. If you have declined to go to the ED at this time, you should call your PCP immediately to set up a follow up appointment.  Go to ED for red flag symptoms, including; fevers you cannot reduce with Tylenol/Motrin, severe headaches, vision changes, numbness/weakness in part of the body, lethargy, confusion, intractable vomiting, severe dehydration, chest pain, breathing difficulty, severe persistent abdominal or pelvic pain, signs of severe infection (increased redness, swelling of an area), feeling faint or passing out, dizziness, etc. You should especially go to the ED for sudden acute worsening of condition if you do not elect to go at this time.

## 2021-07-17 NOTE — ED Notes (Signed)
Patient is being discharged from the Urgent Care and sent to the [facility] Emergency Department via private vehicle . Per Eusebio Friendly, PA, patient is in need of higher level of care due to needing labwork and possible CT scan. Patient is aware and verbalizes understanding of plan of care.  Vitals:   07/17/21 1458  BP: (!) 141/104  Pulse: 98  Resp: 14  Temp: 98.3 F (36.8 C)  SpO2: 100%

## 2021-07-17 NOTE — ED Provider Notes (Signed)
[facility]    CSN: [id number] Arrival date & time: 07/17/21  1414      History   Chief Complaint Chief Complaint  Patient presents with   Diarrhea   Abdominal Pain    HPI Jody Chavez is a 40 y.o. female presenting for 5-day history of periumbilical, epigastric and left upper quadrant abdominal pain with "heartburn."  Patient has also had many episodes of diarrhea.  She says the first 2 to 3 days she had episodes of diarrhea every 15 minutes but over the past 2 to 3 days she has had 3-4 episodes daily.  Stools are dark but she has been taking Pepto-Bismol.  She has also been taking her pantoprazole for GERD.  Patient is type I diabetic.  She says that her insulin was recently decreased.  Blood sugars currently 318 in clinic.  She says she has been trying to eat and drink but has not been consuming as much is normal.  She denies any fevers.  She has had a cough but says she is a smoker and that is not unusual for her.  No congestion, breathing difficulty, dysuria, nausea or vomiting.  She has a past medical history of diverticulitis and feels that symptoms are similar.  She also has history of hypertension and has had DKA in 2018 and 2020.  No other complaints.  HPI  Past Medical History:  Diagnosis Date   Anxiety    Depressed    Diabetes mellitus without complication (HCC)    Ectopic pregnancy    Hypertension    IBS (irritable bowel syndrome)    Paroxysmal SVT (supraventricular tachycardia) (HCC) WPW   Wolff-Parkinson-White (WPW) syndrome     Patient Active Problem List   Diagnosis Date Noted   DKA (diabetic ketoacidoses) 11/01/2018   Generalized abdominal pain 04/06/2017   DKA (diabetic ketoacidosis) (HCC) 11/03/2016   Difficulty in swallowing 11/20/2015    Past Surgical History:  Procedure Laterality Date   ABDOMINAL SURGERY     CARDIAC ELECTROPHYSIOLOGY MAPPING AND ABLATION     CESAREAN SECTION     CHOLECYSTECTOMY     DIAGNOSTIC LAPAROSCOPY WITH  REMOVAL OF ECTOPIC PREGNANCY Right 03/04/2016   Procedure: DIAGNOSTIC LAPAROSCOPY WITH right salpingectomy and REMOVAL OF ECTOPIC PREGNANCY, extensive lysis of adhesions;  Surgeon: Suzy Bouchard, MD;  Location: [facility] ORS;  Service: Gynecology;  Laterality: Right;   ENDOMETRIAL ABLATION     HEART ABLATION      OB History   No obstetric history on file.      Home Medications    Prior to Admission medications   Medication Sig Start Date End Date Taking? Authorizing Provider  amLODipine (NORVASC) 10 MG tablet  08/10/20  Yes [provider]  ARIPiprazole (ABILIFY) 10 MG tablet Take 10 mg by mouth daily.   Yes [provider]  atorvastatin (LIPITOR) 20 MG tablet  04/28/21  Yes [provider]  cyclobenzaprine (FLEXERIL) 10 MG tablet  08/10/20  Yes [provider]  escitalopram (LEXAPRO) 20 MG tablet Take 20 mg by mouth daily.   Yes [provider]  hydrochlorothiazide (HYDRODIURIL) 25 MG tablet  06/21/21  Yes [provider]  insulin detemir (LEVEMIR) 100 UNIT/ML injection Inject 36 Units into the skin daily.   Yes [provider]  insulin lispro (HUMALOG KWIKPEN) 100 UNIT/ML KwikPen  08/10/20  Yes [provider]  lisinopril (PRINIVIL,ZESTRIL) 40 MG tablet Take 1 tablet (40 mg total) by mouth daily. 11/02/18  Yes Auburn Bilberry, MD  pantoprazole (PROTONIX) 20 MG tablet Take 1 tablet (20 mg total) by mouth daily. 11/02/20 11/02/21 Yes Jene Every, MD  cyclobenzaprine (FLEXERIL) 5 MG tablet Take 1 tablet (5 mg total) by mouth 3 (three) times daily as needed for muscle spasms. 07/04/20   Chesley Noon, MD  doxycycline (VIBRAMYCIN) 100 MG capsule Take 1 capsule (100 mg total) by mouth 2 (two) times daily. 11/28/20   Tommie Sams, DO  fluticasone (FLONASE) 50 MCG/ACT nasal spray Place 2 sprays into both nostrils daily. 09/25/20   Domenick Gong, MD  gabapentin (NEURONTIN) 300 MG capsule Take 1 capsule (300 mg total) by  mouth 3 (three) times daily. 07/10/20   Tommie Sams, DO  ibuprofen (ADVIL) 600 MG tablet Take 1 tablet (600 mg total) by mouth every 6 (six) hours as needed. 09/25/20   Domenick Gong, MD  Insulin Aspart (NOVOLOG FLEXPEN [place]) Inject into the skin.    [provider]  Insulin Syringe-Needle U-100 (INSULIN SYRINGE .3CC/31GX5/16") 31G X 5/16" 0.3 ML MISC 5 times daily as directed. 11/04/16   Houston Siren, MD  MIRALAX 17 GM/SCOOP powder Take 17 g by mouth daily. 03/05/21   [provider]  SENNA PLUS 50-8.6 MG CAPS Take 2 capsules by mouth at bedtime as needed. 03/05/21   [provider]    Family History Family History  Problem Relation Age of Onset   Gallbladder disease Mother     Social History Social History   Tobacco Use   Smoking status: Every Day    Packs/day: 1.00    Types: Cigarettes   Smokeless tobacco: Never  Vaping Use   Vaping Use: Never used  Substance Use Topics   Alcohol use: Yes    Comment: socially   Drug use: No     Allergies   Iodine, Shellfish allergy, and Penicillins   Review of Systems Review of Systems  Constitutional:  Positive for appetite change and fatigue. Negative for fever.  HENT:  Negative for congestion.   Respiratory:  Positive for cough. Negative for shortness of breath and wheezing.   Cardiovascular:  Negative for chest pain.  Gastrointestinal:  Positive for abdominal pain and diarrhea. Negative for nausea and vomiting.  Genitourinary:  Negative for dysuria.  Musculoskeletal:  Negative for back pain.  Neurological:  Positive for weakness and headaches. Negative for dizziness.    Physical Exam Triage Vital Signs ED Triage Vitals  Enc Vitals Group     BP 07/17/21 1458 (!) 141/104     Pulse Rate 07/17/21 1458 98     Resp 07/17/21 1458 14     Temp 07/17/21 1458 98.3 F (36.8 C)     Temp Source 07/17/21 1458 Oral     SpO2 07/17/21 1458 100 %     Weight 07/17/21 1453 190 lb (86.2 kg)     Height 07/17/21  1453 5\' 3"  (1.6 m)     Head Circumference --      Peak Flow --      Pain Score 07/17/21 1453 8     Pain Loc --      Pain Edu? --      Excl. in GC? --    No data found.  Updated Vital Signs BP (!) 141/104 (BP Location: Left Arm)   Pulse 98   Temp 98.3 F (36.8 C) (Oral)   Resp 14   Ht 5\' 3"  (1.6 m)   Wt 190 lb (86.2 kg)   LMP 06/23/2021 (Approximate)   SpO2 100%  BMI 33.66 kg/m      Physical Exam Vitals and nursing note reviewed.  Constitutional:      General: She is not in acute distress.    Appearance: Normal appearance. She is ill-appearing. She is not toxic-appearing or diaphoretic.  HENT:     Head: Normocephalic and atraumatic.     Nose: Nose normal.     Mouth/Throat:     Mouth: Mucous membranes are moist.     Pharynx: Oropharynx is clear.  Eyes:     General: No scleral icterus.       Right eye: No discharge.        Left eye: No discharge.     Conjunctiva/sclera: Conjunctivae normal.  Cardiovascular:     Rate and Rhythm: Normal rate and regular rhythm.     Heart sounds: Normal heart sounds.  Pulmonary:     Effort: Pulmonary effort is normal. No respiratory distress.     Breath sounds: Normal breath sounds.  Abdominal:     Palpations: Abdomen is soft.     Tenderness: There is abdominal tenderness in the epigastric area, periumbilical area and left upper quadrant. There is no right CVA tenderness or left CVA tenderness.     Comments: Patient is tearful when I palpate abdomen  Musculoskeletal:     Cervical back: Neck supple.  Skin:    General: Skin is dry.  Neurological:     General: No focal deficit present.     Mental Status: She is alert. Mental status is at baseline.     Motor: No weakness.     Coordination: Coordination normal.     Gait: Gait normal.  Psychiatric:        Mood and Affect: Mood normal.        Behavior: Behavior normal.        Thought Content: Thought content normal.     UC Treatments / Results  Labs (all labs ordered are  listed, but only abnormal results are displayed) Labs Reviewed  GLUCOSE, CAPILLARY - Abnormal; Notable for the following components:      Result Value   Glucose-Capillary 318 (*)    All other components within normal limits  POCT URINALYSIS DIP (DEVICE) - Abnormal; Notable for the following components:   Glucose, UA >=1000 (*)    Ketones, ur 15 (*)    All other components within normal limits    EKG   Radiology No results found.  Procedures Procedures (including critical care time)  Medications Ordered in UC Medications - No data to display  Initial Impression / Assessment and Plan / UC Course  I have reviewed the triage vital signs and the nursing notes.  Pertinent labs & imaging results that were available during my care of the patient were reviewed by me and considered in my medical decision making (see chart for details).  40 year old female presenting for 5-day history of diarrhea and periumbilical abdominal pain.  Also admits to upper abdominal pain and heartburn.  Patient has had dark stools but has been taking Pepto-Bismol.  She has type I diabetic and has history of diverticulitis.  CBG in clinic is 318.  Urinalysis shows greater than 1000 glucose and 15 ketones.  Reviewed results with patient.  She does have abdominal tenderness on exam and is tearful.  Given her history of DKA and diverticulitis, I have advised patient that she warrants further work-up in the emergency department at this time. Could have mild DKA at this time. She needs to have lab  work, IV fluids and CT scan to diagnose cause of her abdominal pain but I do have high suspicion for diverticulitis.  Patient agrees to plan and is driving herself to [facility] ED [place] at this time.  She is leaving in stable condition.  Final Clinical Impressions(s) / UC Diagnoses   Final diagnoses:  Periumbilical abdominal pain  Diarrhea, unspecified type  Type 1 diabetes mellitus without complication (HCC)  History of  diverticulitis     Discharge Instructions      -Your blood sugar is high at 318 and you have ketones in the urine. -You have been advised to go to the emergency department this time for labs, fluids and CT scan.  High suspicion for diverticulitis  You have been advised to follow up immediately in the emergency department for concerning signs.symptoms. If you declined EMS transport, please have a family member take you directly to the ED at this time. Do not delay. Based on concerns about condition, if you do not follow up in th e ED, you may risk poor outcomes including worsening of condition, delayed treatment and potentially life threatening issues. If you have declined to go to the ED at this time, you should call your PCP immediately to set up a follow up appointment.  Go to ED for red flag symptoms, including; fevers you cannot reduce with Tylenol/Motrin, severe headaches, vision changes, numbness/weakness in part of the body, lethargy, confusion, intractable vomiting, severe dehydration, chest pain, breathing difficulty, severe persistent abdominal or pelvic pain, signs of severe infection (increased redness, swelling of an area), feeling faint or passing out, dizziness, etc. You should especially go to the ED for sudden acute worsening of condition if you do not elect to go at this time.      ED Prescriptions   None    PDMP not reviewed this encounter.   Shirlee Latch, PA-C 07/17/21 1525

## 2021-12-22 ENCOUNTER — Encounter: Payer: Self-pay | Admitting: Advanced Practice Midwife

## 2021-12-22 ENCOUNTER — Other Ambulatory Visit: Payer: Self-pay

## 2021-12-22 ENCOUNTER — Ambulatory Visit: Payer: Self-pay | Admitting: Advanced Practice Midwife

## 2021-12-22 DIAGNOSIS — F319 Bipolar disorder, unspecified: Secondary | ICD-10-CM

## 2021-12-22 DIAGNOSIS — E1022 Type 1 diabetes mellitus with diabetic chronic kidney disease: Secondary | ICD-10-CM

## 2021-12-22 DIAGNOSIS — F325 Major depressive disorder, single episode, in full remission: Secondary | ICD-10-CM

## 2021-12-22 DIAGNOSIS — Z62810 Personal history of physical and sexual abuse in childhood: Secondary | ICD-10-CM

## 2021-12-22 DIAGNOSIS — F17200 Nicotine dependence, unspecified, uncomplicated: Secondary | ICD-10-CM

## 2021-12-22 DIAGNOSIS — K589 Irritable bowel syndrome without diarrhea: Secondary | ICD-10-CM | POA: Insufficient documentation

## 2021-12-22 DIAGNOSIS — T7411XA Adult physical abuse, confirmed, initial encounter: Secondary | ICD-10-CM | POA: Insufficient documentation

## 2021-12-22 DIAGNOSIS — T7411XS Adult physical abuse, confirmed, sequela: Secondary | ICD-10-CM

## 2021-12-22 DIAGNOSIS — B9689 Other specified bacterial agents as the cause of diseases classified elsewhere: Secondary | ICD-10-CM

## 2021-12-22 DIAGNOSIS — Z113 Encounter for screening for infections with a predominantly sexual mode of transmission: Secondary | ICD-10-CM

## 2021-12-22 LAB — HM HEPATITIS C SCREENING LAB: HM Hepatitis Screen: NEGATIVE

## 2021-12-22 LAB — WET PREP FOR TRICH, YEAST, CLUE
Trichomonas Exam: NEGATIVE
Yeast Exam: NEGATIVE

## 2021-12-22 LAB — PREGNANCY, URINE: Preg Test, Ur: NEGATIVE

## 2021-12-22 LAB — HM HIV SCREENING LAB: HM HIV Screening: NEGATIVE

## 2021-12-22 MED ORDER — METRONIDAZOLE 500 MG PO TABS: 500.0000 mg | ORAL_TABLET | Freq: Two times a day (BID) | ORAL | 0 refills | Status: AC

## 2021-12-22 NOTE — Progress Notes (Signed)
Patient seen for STD testing. Condoms given.  Medication dispensed per standing orders and charted.

## 2021-12-22 NOTE — Addendum Note (Signed)
Addended by: Earlene Plater on: 12/22/2021 03:04 PM   Modules accepted: Orders
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
## 2021-12-27 LAB — GONOCOCCUS CULTURE

## 2022-05-02 ENCOUNTER — Ambulatory Visit: Payer: Self-pay | Admitting: Nurse Practitioner

## 2022-05-02 ENCOUNTER — Encounter: Payer: Self-pay | Admitting: Nurse Practitioner

## 2022-05-02 DIAGNOSIS — Z113 Encounter for screening for infections with a predominantly sexual mode of transmission: Secondary | ICD-10-CM

## 2022-05-02 DIAGNOSIS — A630 Anogenital (venereal) warts: Secondary | ICD-10-CM

## 2022-05-02 LAB — HEPATITIS B SURFACE ANTIGEN: Hepatitis B Surface Ag: NONREACTIVE

## 2022-05-02 LAB — HM HIV SCREENING LAB: HM HIV Screening: NEGATIVE

## 2022-05-02 LAB — HM HEPATITIS C SCREENING LAB: HM Hepatitis Screen: NEGATIVE

## 2022-05-02 NOTE — Progress Notes (Unsigned)
Pt here for STI screening.  Wet mount results reviewed with patient.  No treatment needed.  Condoms declined.-Collins Scotland, RN
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
## 2022-05-04 LAB — WET PREP FOR TRICH, YEAST, CLUE
Trichomonas Exam: NEGATIVE
Yeast Exam: NEGATIVE

## 2022-05-11 ENCOUNTER — Emergency Department
Admission: EM | Admit: 2022-05-11 | Discharge: 2022-05-11 | Discharge status: Against Medical Advice | Payer: Managed Care, Other (non HMO) | Attending: Student | Admitting: Student

## 2022-05-11 ENCOUNTER — Other Ambulatory Visit: Payer: Self-pay

## 2022-05-11 DIAGNOSIS — Z5321 Procedure and treatment not carried out due to patient leaving prior to being seen by health care provider: Secondary | ICD-10-CM | POA: Insufficient documentation

## 2022-05-11 DIAGNOSIS — K5900 Constipation, unspecified: Secondary | ICD-10-CM | POA: Insufficient documentation

## 2022-05-11 LAB — CBC WITH DIFFERENTIAL/PLATELET
Abs Immature Granulocytes: 0.03 10*3/uL (ref 0.00–0.07)
Basophils Absolute: 0 10*3/uL (ref 0.0–0.1)
Basophils Relative: 1 %
Eosinophils Absolute: 0.1 10*3/uL (ref 0.0–0.5)
Eosinophils Relative: 1 %
HCT: 43 % (ref 36.0–46.0)
Hemoglobin: 14.1 g/dL (ref 12.0–15.0)
Immature Granulocytes: 0 %
Lymphocytes Relative: 25 %
Lymphs Abs: 2.1 10*3/uL (ref 0.7–4.0)
MCH: 30.1 pg (ref 26.0–34.0)
MCHC: 32.8 g/dL (ref 30.0–36.0)
MCV: 91.7 fL (ref 80.0–100.0)
Monocytes Absolute: 0.6 10*3/uL (ref 0.1–1.0)
Monocytes Relative: 7 %
Neutro Abs: 5.6 10*3/uL (ref 1.7–7.7)
Neutrophils Relative %: 66 %
Platelets: 286 10*3/uL (ref 150–400)
RBC: 4.69 MIL/uL (ref 3.87–5.11)
RDW: 14.4 % (ref 11.5–15.5)
WBC: 8.5 10*3/uL (ref 4.0–10.5)
nRBC: 0 % (ref 0.0–0.2)

## 2022-05-11 LAB — COMPREHENSIVE METABOLIC PANEL
ALT: 18 U/L (ref 0–44)
AST: 24 U/L (ref 15–41)
Albumin: 4.2 g/dL (ref 3.5–5.0)
Alkaline Phosphatase: 63 U/L (ref 38–126)
Anion gap: 10 (ref 5–15)
BUN: 10 mg/dL (ref 6–20)
CO2: 25 mmol/L (ref 22–32)
Calcium: 9.3 mg/dL (ref 8.9–10.3)
Chloride: 102 mmol/L (ref 98–111)
Creatinine, Ser: 0.91 mg/dL (ref 0.44–1.00)
GFR, Estimated: >60 mL/min (ref >60)
Glucose, Bld: 261 mg/dL — ABNORMAL HIGH (ref 70–99)
Potassium: 3.9 mmol/L (ref 3.5–5.1)
Sodium: 137 mmol/L (ref 135–145)
Total Bilirubin: 1 mg/dL (ref 0.3–1.2)
Total Protein: 7.6 g/dL (ref 6.5–8.1)

## 2022-05-11 LAB — URINALYSIS, ROUTINE W REFLEX MICROSCOPIC
Bilirubin Urine: NEGATIVE
Glucose, UA: >=500 mg/dL — AB
Hgb urine dipstick: NEGATIVE
Ketones, ur: 80 mg/dL — AB
Leukocytes,Ua: NEGATIVE
Nitrite: POSITIVE — AB
Protein, ur: NEGATIVE mg/dL
Specific Gravity, Urine: 1.025 (ref 1.005–1.030)
pH: 5 (ref 5.0–8.0)

## 2022-05-11 LAB — POC URINE PREG, ED: Preg Test, Ur: NEGATIVE

## 2022-05-11 LAB — LIPASE, BLOOD: Lipase: 31 U/L (ref 11–51)

## 2022-05-11 LAB — PREGNANCY, URINE: Preg Test, Ur: NEGATIVE

## 2022-05-11 NOTE — ED Triage Notes (Signed)
Pt arrives POV to triage alert and oriented with steady gait. 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 Pt has experienced abd pain with nausea and vomiting.

## 2022-05-11 NOTE — ED Provider Triage Note (Signed)
Emergency Medicine Provider Triage Evaluation Note  Jody Chavez , a 41 y.o. female  was evaluated in triage.  Pt complains of n/v and abd pain x3 days. Has felt constipated. Pain is periumbilical.  No dysuria. Has had GB removal  Review of Systems  Positive: Abd pain, n/v Negative: Fever, dysuria  Physical Exam  LMP 04/18/2022 (Approximate)  Gen:   Awake, no distress   Resp:  Normal effort  MSK:   Moves extremities without difficulty  Other:    Medical Decision Making  Medically screening exam initiated at 7:18 PM.  Appropriate orders placed.  Jody Chavez was informed that the remainder of the evaluation will be completed by another provider, this initial triage assessment does not replace that evaluation, and the importance of remaining in the ED until their evaluation is complete.    Jody Chavez 05/11/22 1921

## 2022-08-12 ENCOUNTER — Emergency Department
Admission: EM | Admit: 2022-08-12 | Discharge: 2022-08-12 | Disposition: A | Discharge status: Home / Self Care | Payer: No Typology Code available for payment source | Attending: Emergency Medicine | Admitting: Emergency Medicine

## 2022-08-12 ENCOUNTER — Emergency Department: Payer: No Typology Code available for payment source

## 2022-08-12 ENCOUNTER — Other Ambulatory Visit: Payer: Self-pay

## 2022-08-12 DIAGNOSIS — N9489 Other specified conditions associated with female genital organs and menstrual cycle: Secondary | ICD-10-CM | POA: Diagnosis not present

## 2022-08-12 DIAGNOSIS — M5412 Radiculopathy, cervical region: Secondary | ICD-10-CM

## 2022-08-12 DIAGNOSIS — I10 Essential (primary) hypertension: Secondary | ICD-10-CM

## 2022-08-12 DIAGNOSIS — M542 Cervicalgia: Secondary | ICD-10-CM | POA: Diagnosis present

## 2022-08-12 DIAGNOSIS — Z79899 Other long term (current) drug therapy: Secondary | ICD-10-CM | POA: Insufficient documentation

## 2022-08-12 DIAGNOSIS — R519 Headache, unspecified: Secondary | ICD-10-CM | POA: Insufficient documentation

## 2022-08-12 DIAGNOSIS — E119 Type 2 diabetes mellitus without complications: Secondary | ICD-10-CM | POA: Insufficient documentation

## 2022-08-12 DIAGNOSIS — Y92410 Unspecified street and highway as the place of occurrence of the external cause: Secondary | ICD-10-CM | POA: Diagnosis not present

## 2022-08-12 DIAGNOSIS — Z794 Long term (current) use of insulin: Secondary | ICD-10-CM | POA: Diagnosis not present

## 2022-08-12 LAB — URINALYSIS, ROUTINE W REFLEX MICROSCOPIC
Bilirubin Urine: NEGATIVE
Glucose, UA: >=500 mg/dL — AB
Hgb urine dipstick: NEGATIVE
Ketones, ur: 20 mg/dL — AB
Leukocytes,Ua: NEGATIVE
Nitrite: NEGATIVE
Protein, ur: NEGATIVE mg/dL
Specific Gravity, Urine: 1.032 — ABNORMAL HIGH (ref 1.005–1.030)
pH: 5 (ref 5.0–8.0)

## 2022-08-12 LAB — CBC WITH DIFFERENTIAL/PLATELET
Abs Immature Granulocytes: 0.05 10*3/uL (ref 0.00–0.07)
Basophils Absolute: 0.1 10*3/uL (ref 0.0–0.1)
Basophils Relative: 1 %
Eosinophils Absolute: 0.2 10*3/uL (ref 0.0–0.5)
Eosinophils Relative: 2 %
HCT: 37.4 % (ref 36.0–46.0)
Hemoglobin: 12.4 g/dL (ref 12.0–15.0)
Immature Granulocytes: 1 %
Lymphocytes Relative: 20 %
Lymphs Abs: 1.9 10*3/uL (ref 0.7–4.0)
MCH: 29.7 pg (ref 26.0–34.0)
MCHC: 33.2 g/dL (ref 30.0–36.0)
MCV: 89.7 fL (ref 80.0–100.0)
Monocytes Absolute: 0.9 10*3/uL (ref 0.1–1.0)
Monocytes Relative: 10 %
Neutro Abs: 6.2 10*3/uL (ref 1.7–7.7)
Neutrophils Relative %: 66 %
Platelets: 307 10*3/uL (ref 150–400)
RBC: 4.17 MIL/uL (ref 3.87–5.11)
RDW: 14.6 % (ref 11.5–15.5)
WBC: 9.3 10*3/uL (ref 4.0–10.5)
nRBC: 0 % (ref 0.0–0.2)

## 2022-08-12 LAB — COMPREHENSIVE METABOLIC PANEL
ALT: 16 U/L (ref 0–44)
AST: 24 U/L (ref 15–41)
Albumin: 3.7 g/dL (ref 3.5–5.0)
Alkaline Phosphatase: 50 U/L (ref 38–126)
Anion gap: 10 (ref 5–15)
BUN: 16 mg/dL (ref 6–20)
CO2: 23 mmol/L (ref 22–32)
Calcium: 8.9 mg/dL (ref 8.9–10.3)
Chloride: 101 mmol/L (ref 98–111)
Creatinine, Ser: 0.9 mg/dL (ref 0.44–1.00)
GFR, Estimated: >60 mL/min (ref >60)
Glucose, Bld: 193 mg/dL — ABNORMAL HIGH (ref 70–99)
Potassium: 3.5 mmol/L (ref 3.5–5.1)
Sodium: 134 mmol/L — ABNORMAL LOW (ref 135–145)
Total Bilirubin: 1 mg/dL (ref 0.3–1.2)
Total Protein: 6.9 g/dL (ref 6.5–8.1)

## 2022-08-12 LAB — HCG, QUANTITATIVE, PREGNANCY: hCG, Beta Chain, Quant, S: <1 m[IU]/mL (ref <5)

## 2022-08-12 LAB — CBG MONITORING, ED: Glucose-Capillary: 181 mg/dL — ABNORMAL HIGH (ref 70–99)

## 2022-08-12 MED ORDER — IBUPROFEN 800 MG PO TABS: 800.0000 mg | ORAL_TABLET | Freq: Three times a day (TID) | ORAL | 0 refills | Status: AC | PRN

## 2022-08-12 MED ORDER — PREDNISONE 10 MG (21) PO TBPK: ORAL_TABLET | ORAL | 0 refills | Status: DC

## 2022-08-12 MED ORDER — ONDANSETRON HCL 4 MG/2ML IJ SOLN
4.0000 mg | Freq: Once | INTRAMUSCULAR | Status: AC
  Administered 2022-08-12: 4 mg via INTRAVENOUS
  Filled 2022-08-12: qty 2

## 2022-08-12 MED ORDER — DEXAMETHASONE SODIUM PHOSPHATE 10 MG/ML IJ SOLN
10.0000 mg | Freq: Once | INTRAMUSCULAR | Status: AC
  Administered 2022-08-12: 10 mg via INTRAVENOUS
  Filled 2022-08-12: qty 1

## 2022-08-12 MED ORDER — HYDROCODONE-ACETAMINOPHEN 5-325 MG PO TABS: 2.0000 | ORAL_TABLET | Freq: Four times a day (QID) | ORAL | 0 refills | Status: DC | PRN

## 2022-08-12 MED ORDER — ONDANSETRON 4 MG PO TBDP: 4.0000 mg | ORAL_TABLET | Freq: Four times a day (QID) | ORAL | 0 refills | Status: DC | PRN

## 2022-08-12 MED ORDER — FENTANYL CITRATE PF 50 MCG/ML IJ SOSY
50.0000 ug | PREFILLED_SYRINGE | Freq: Once | INTRAMUSCULAR | Status: AC
  Administered 2022-08-12: 50 ug via INTRAVENOUS
  Filled 2022-08-12: qty 1

## 2022-08-12 MED ORDER — MORPHINE SULFATE (PF) 4 MG/ML IV SOLN
4.0000 mg | Freq: Once | INTRAVENOUS | Status: AC
  Administered 2022-08-12: 4 mg via INTRAVENOUS
  Filled 2022-08-12: qty 1

## 2022-08-12 MED ORDER — IOHEXOL 300 MG/ML  SOLN
100.0000 mL | Freq: Once | INTRAMUSCULAR | Status: AC | PRN
  Administered 2022-08-12: 100 mL via INTRAVENOUS
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
## 2022-08-12 NOTE — Discharge Instructions (Addendum)
Your imaging showed no acute traumatic injury today.  You are being provided a prescription for opiates (also known as narcotics) for pain control.  Opiates can be addictive and should only be used when absolutely necessary for pain control when other alternatives do not work.  We recommend you only use them for the recommended amount of time and only as prescribed.  Please do not take with other sedative medications or alcohol.  Please do not drive, operate machinery, make important decisions while taking opiates.  Please note that these medications can be addictive and have high abuse potential.  Patients can become addicted to narcotics after only taking them for a few days.  Please keep these medications locked away from children, teenagers or any family members with history of substance abuse.  Narcotic pain medicine may also make you constipated.  You may use over-the-counter medications such as MiraLAX, Colace to prevent constipation.  If you become constipated, you may use over-the-counter enemas as needed.  Itching and nausea are also common side effects of narcotic pain medication.  If you develop uncontrolled vomiting or a rash, please stop these medications and seek medical care.   Your MRI C spine showed:  IMPRESSION: 1. 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 Increased moderate sized right paracentral and cephalad disc herniation there, and increased left foraminal component of disc also. 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   2. No cervical spinal cord signal abnormality. No ligamentous Injury identified in the cervical spine.   3. Other generally mild cervical spine degeneration appears stable since 2021, including mild to moderate left C6 neural foraminal stenosis.  There is nothing that needs emergent surgical intervention but if you continue to have neck pain  and numbness in your fingertips that I recommend close follow-up with neurosurgery as an outpatient.
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
## 2022-08-23 ENCOUNTER — Ambulatory Visit
Admission: RE | Admit: 2022-08-23 | Discharge: 2022-08-23 | Disposition: A | Discharge status: Home / Self Care | Payer: Self-pay | Source: Ambulatory Visit | Attending: Neurosurgery | Admitting: Neurosurgery

## 2022-08-23 ENCOUNTER — Other Ambulatory Visit: Payer: Self-pay

## 2022-08-23 DIAGNOSIS — Z049 Encounter for examination and observation for unspecified reason: Secondary | ICD-10-CM
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
## 2022-09-01 ENCOUNTER — Ambulatory Visit: Payer: Self-pay | Admitting: Neurosurgery

## 2022-09-01 ENCOUNTER — Encounter: Payer: Self-pay | Admitting: Neurosurgery

## 2022-09-01 VITALS — BP 130/86 | Ht 62.0 in | Wt 167.8 lb

## 2022-09-01 DIAGNOSIS — M542 Cervicalgia: Secondary | ICD-10-CM

## 2022-09-01 DIAGNOSIS — M545 Low back pain, unspecified: Secondary | ICD-10-CM

## 2022-09-01 DIAGNOSIS — M5412 Radiculopathy, cervical region: Secondary | ICD-10-CM

## 2022-09-01 NOTE — Addendum Note (Signed)
Addended by: Herb Grays on: 09/01/2022 04:43 PM   Modules accepted: Orders

## 2022-10-30 ENCOUNTER — Encounter: Payer: Self-pay | Admitting: Emergency Medicine

## 2022-10-30 ENCOUNTER — Emergency Department
Admission: EM | Admit: 2022-10-30 | Discharge: 2022-10-30 | Disposition: A | Discharge status: Home / Self Care | Payer: BLUE CROSS/BLUE SHIELD | Attending: Emergency Medicine | Admitting: Emergency Medicine

## 2022-10-30 ENCOUNTER — Other Ambulatory Visit: Payer: Self-pay

## 2022-10-30 ENCOUNTER — Emergency Department: Payer: BLUE CROSS/BLUE SHIELD

## 2022-10-30 DIAGNOSIS — R197 Diarrhea, unspecified: Secondary | ICD-10-CM | POA: Insufficient documentation

## 2022-10-30 DIAGNOSIS — B349 Viral infection, unspecified: Secondary | ICD-10-CM | POA: Insufficient documentation

## 2022-10-30 DIAGNOSIS — Z20822 Contact with and (suspected) exposure to covid-19: Secondary | ICD-10-CM | POA: Insufficient documentation

## 2022-10-30 DIAGNOSIS — E10649 Type 1 diabetes mellitus with hypoglycemia without coma: Secondary | ICD-10-CM | POA: Diagnosis not present

## 2022-10-30 DIAGNOSIS — R112 Nausea with vomiting, unspecified: Secondary | ICD-10-CM

## 2022-10-30 DIAGNOSIS — E162 Hypoglycemia, unspecified: Secondary | ICD-10-CM

## 2022-10-30 DIAGNOSIS — I10 Essential (primary) hypertension: Secondary | ICD-10-CM | POA: Diagnosis not present

## 2022-10-30 HISTORY — DX: Type 2 diabetes mellitus without complications: E11.9

## 2022-10-30 LAB — URINALYSIS, ROUTINE W REFLEX MICROSCOPIC
Bacteria, UA: NONE SEEN
Bilirubin Urine: NEGATIVE
Glucose, UA: >=500 mg/dL — AB
Hgb urine dipstick: NEGATIVE
Ketones, ur: NEGATIVE mg/dL
Nitrite: NEGATIVE
Protein, ur: NEGATIVE mg/dL
Specific Gravity, Urine: 1.016 (ref 1.005–1.030)
pH: 7 (ref 5.0–8.0)

## 2022-10-30 LAB — COMPREHENSIVE METABOLIC PANEL
ALT: 19 U/L (ref 0–44)
AST: 28 U/L (ref 15–41)
Albumin: 3.9 g/dL (ref 3.5–5.0)
Alkaline Phosphatase: 65 U/L (ref 38–126)
Anion gap: 6 (ref 5–15)
BUN: 16 mg/dL (ref 6–20)
CO2: 26 mmol/L (ref 22–32)
Calcium: 9.5 mg/dL (ref 8.9–10.3)
Chloride: 108 mmol/L (ref 98–111)
Creatinine, Ser: 0.75 mg/dL (ref 0.44–1.00)
GFR, Estimated: >60 mL/min (ref >60)
Glucose, Bld: 45 mg/dL — ABNORMAL LOW (ref 70–99)
Potassium: 3.3 mmol/L — ABNORMAL LOW (ref 3.5–5.1)
Sodium: 140 mmol/L (ref 135–145)
Total Bilirubin: 0.7 mg/dL (ref 0.3–1.2)
Total Protein: 7.3 g/dL (ref 6.5–8.1)

## 2022-10-30 LAB — RESP PANEL BY RT-PCR (RSV, FLU A&B, COVID)  RVPGX2
Influenza A by PCR: NEGATIVE
Influenza B by PCR: NEGATIVE
Resp Syncytial Virus by PCR: NEGATIVE
SARS Coronavirus 2 by RT PCR: NEGATIVE

## 2022-10-30 LAB — POC URINE PREG, ED: Preg Test, Ur: NEGATIVE

## 2022-10-30 LAB — CBC
HCT: 39.7 % (ref 36.0–46.0)
Hemoglobin: 13.2 g/dL (ref 12.0–15.0)
MCH: 29.2 pg (ref 26.0–34.0)
MCHC: 33.2 g/dL (ref 30.0–36.0)
MCV: 87.8 fL (ref 80.0–100.0)
Platelets: 302 10*3/uL (ref 150–400)
RBC: 4.52 MIL/uL (ref 3.87–5.11)
RDW: 14.6 % (ref 11.5–15.5)
WBC: 6.4 10*3/uL (ref 4.0–10.5)
nRBC: 0 % (ref 0.0–0.2)

## 2022-10-30 LAB — CBG MONITORING, ED
Glucose-Capillary: 253 mg/dL — ABNORMAL HIGH (ref 70–99)
Glucose-Capillary: 35 mg/dL — CL (ref 70–99)
Glucose-Capillary: 36 mg/dL — CL (ref 70–99)

## 2022-10-30 LAB — LIPASE, BLOOD: Lipase: 45 U/L (ref 11–51)

## 2022-10-30 MED ORDER — DEXTROSE 50 % IV SOLN
1.0000 | Freq: Once | INTRAVENOUS | Status: AC
  Administered 2022-10-30: 50 mL via INTRAVENOUS
  Filled 2022-10-30: qty 50

## 2022-10-30 MED ORDER — ONDANSETRON 8 MG PO TBDP: 8.0000 mg | ORAL_TABLET | Freq: Three times a day (TID) | ORAL | 0 refills | Status: AC | PRN

## 2022-10-30 MED ORDER — ONDANSETRON HCL 4 MG/2ML IJ SOLN
4.0000 mg | Freq: Once | INTRAMUSCULAR | Status: AC
  Administered 2022-10-30: 4 mg via INTRAVENOUS
  Filled 2022-10-30: qty 2

## 2022-10-30 MED ORDER — IOHEXOL 300 MG/ML  SOLN
100.0000 mL | Freq: Once | INTRAMUSCULAR | Status: AC | PRN
  Administered 2022-10-30: 100 mL via INTRAVENOUS

## 2022-10-30 MED ORDER — SODIUM CHLORIDE 0.9 % IV BOLUS
1000.0000 mL | Freq: Once | INTRAVENOUS | Status: AC
  Administered 2022-10-30: 1000 mL via INTRAVENOUS

## 2022-10-30 MED ORDER — IOHEXOL 240 MG/ML SOLN: 100.0000 mL | Freq: Once | INTRAMUSCULAR | Status: DC | PRN

## 2022-10-30 MED ORDER — PSEUDOEPHEDRINE HCL 60 MG PO TABS: 60.0000 mg | ORAL_TABLET | Freq: Four times a day (QID) | ORAL | 0 refills | Status: AC | PRN

## 2022-10-30 NOTE — ED Notes (Signed)
EDP at bedside  

## 2022-10-30 NOTE — ED Notes (Signed)
CBG now 253.

## 2022-10-30 NOTE — Discharge Instructions (Signed)
Continue taking your diabetes medications as prescribed although be careful about taking your insulin when you have not eaten.  You may take the Zofran as needed for nausea and the pseudoephedrine to decrease congestion.  Make sure to drink plenty of fluids.  Return to the ER for new, worsening, or persistent severe vomiting or diarrhea, abdominal pain, fever, difficulty urinating, weakness or lightheadedness, or any other new or worsening symptoms that concern you.

## 2022-10-30 NOTE — ED Notes (Signed)
Pt to ED for diarrhea and HA this week. Pt was found to have CBG of 36 in triage. IV dextrose given already. Pt has DM type 1 and says has not been able to eat well this week and that sugars have been running around 140 until yesterday when CBG was 55. Pt is alert and oriented but slightly lethargic.

## 2022-10-30 NOTE — ED Notes (Signed)
CBG 36 at this time, pt alert and given OJ at this time. Pt roomed to room 41.
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
## 2022-10-30 NOTE — ED Triage Notes (Signed)
First Nurse Note;  Pt via EMS from home. EMS called because pt did not show up for work, pt was called out for a wellness check. Pt c/o weakness, headache and diarrhea for the past three days. Pt is A&Ox4 and NAD    155 CBG  102 HR  130/90  99% on RA 97.7 oral  #18 G L AC

## 2022-10-30 NOTE — ED Notes (Signed)
Pt sipping orange juice being wheeled into room. Pt is alert and oriented. Contacting EDP for IV dextrose orders.

## 2022-10-30 NOTE — ED Triage Notes (Signed)
Pt via EMS from home. Pt c/o diarrhea and headache for the past three days. Pt is type I DM, states her blood sugars have been controlled. Pt is A&Ox4 and NAD

## 2022-11-01 ENCOUNTER — Encounter: Payer: Self-pay | Admitting: Neurosurgery

## 2022-11-03 ENCOUNTER — Encounter: Payer: Self-pay | Admitting: Emergency Medicine

## 2022-11-03 DIAGNOSIS — Z1152 Encounter for screening for COVID-19: Secondary | ICD-10-CM | POA: Insufficient documentation

## 2022-11-03 DIAGNOSIS — R197 Diarrhea, unspecified: Secondary | ICD-10-CM | POA: Diagnosis not present

## 2022-11-03 DIAGNOSIS — R112 Nausea with vomiting, unspecified: Secondary | ICD-10-CM | POA: Insufficient documentation

## 2022-11-03 DIAGNOSIS — E109 Type 1 diabetes mellitus without complications: Secondary | ICD-10-CM | POA: Diagnosis not present

## 2022-11-03 LAB — CBC WITH DIFFERENTIAL/PLATELET
Abs Immature Granulocytes: 0.03 10*3/uL (ref 0.00–0.07)
Basophils Absolute: 0 10*3/uL (ref 0.0–0.1)
Basophils Relative: 0 %
Eosinophils Absolute: 0.2 10*3/uL (ref 0.0–0.5)
Eosinophils Relative: 2 %
HCT: 39.2 % (ref 36.0–46.0)
Hemoglobin: 12.7 g/dL (ref 12.0–15.0)
Immature Granulocytes: 0 %
Lymphocytes Relative: 19 %
Lymphs Abs: 1.8 10*3/uL (ref 0.7–4.0)
MCH: 29 pg (ref 26.0–34.0)
MCHC: 32.4 g/dL (ref 30.0–36.0)
MCV: 89.5 fL (ref 80.0–100.0)
Monocytes Absolute: 0.6 10*3/uL (ref 0.1–1.0)
Monocytes Relative: 6 %
Neutro Abs: 6.5 10*3/uL (ref 1.7–7.7)
Neutrophils Relative %: 73 %
Platelets: 269 10*3/uL (ref 150–400)
RBC: 4.38 MIL/uL (ref 3.87–5.11)
RDW: 14.6 % (ref 11.5–15.5)
WBC: 9.2 10*3/uL (ref 4.0–10.5)
nRBC: 0 % (ref 0.0–0.2)

## 2022-11-03 LAB — COMPREHENSIVE METABOLIC PANEL
ALT: 20 U/L (ref 0–44)
AST: 23 U/L (ref 15–41)
Albumin: 3.9 g/dL (ref 3.5–5.0)
Alkaline Phosphatase: 67 U/L (ref 38–126)
Anion gap: 15 (ref 5–15)
BUN: 16 mg/dL (ref 6–20)
CO2: 25 mmol/L (ref 22–32)
Calcium: 9.5 mg/dL (ref 8.9–10.3)
Chloride: 98 mmol/L (ref 98–111)
Creatinine, Ser: 0.88 mg/dL (ref 0.44–1.00)
GFR, Estimated: >60 mL/min (ref >60)
Glucose, Bld: 280 mg/dL — ABNORMAL HIGH (ref 70–99)
Potassium: 3.6 mmol/L (ref 3.5–5.1)
Sodium: 138 mmol/L (ref 135–145)
Total Bilirubin: 1 mg/dL (ref 0.3–1.2)
Total Protein: 7.2 g/dL (ref 6.5–8.1)

## 2022-11-03 LAB — RESP PANEL BY RT-PCR (RSV, FLU A&B, COVID)  RVPGX2
Influenza A by PCR: NEGATIVE
Influenza B by PCR: NEGATIVE
Resp Syncytial Virus by PCR: NEGATIVE
SARS Coronavirus 2 by RT PCR: NEGATIVE

## 2022-11-03 LAB — CBG MONITORING, ED: Glucose-Capillary: 285 mg/dL — ABNORMAL HIGH (ref 70–99)

## 2022-11-03 NOTE — ED Triage Notes (Addendum)
Pt presents via POV with complaints of N/V/D for the last week. Pt was seen here for same and was told she had a stomach bug. She notes her sx improving until going back to work yesterday and she was sent home. She notes being compliant with her medications that were prescribed 5 days ago. Denies CP or SOB.   CBG -285.

## 2022-11-04 ENCOUNTER — Emergency Department
Admission: EM | Admit: 2022-11-04 | Discharge: 2022-11-04 | Disposition: A | Discharge status: Home / Self Care | Payer: BLUE CROSS/BLUE SHIELD | Attending: Emergency Medicine | Admitting: Emergency Medicine

## 2022-11-04 DIAGNOSIS — R112 Nausea with vomiting, unspecified: Secondary | ICD-10-CM

## 2022-11-04 MED ORDER — PROMETHAZINE HCL 25 MG PO TABS: 25.0000 mg | ORAL_TABLET | Freq: Four times a day (QID) | ORAL | 0 refills | Status: AC | PRN

## 2022-11-04 NOTE — Discharge Instructions (Addendum)

## 2022-11-04 NOTE — ED Provider Notes (Signed)
[facility] Provider Note    Event Date/Time   First MD Initiated Contact with Patient 11/04/22 0105     (approximate)   History   Emesis   HPI  Jody Chavez is a 42 y.o. female  with type 1 DM, bipolar disorder, and depression.  She presents for continued nausea, vomiting, and diarrhea.  She has had symptoms for about a week and was seen previously in the emergency department.  She says that her symptoms have gotten better but then she went back to work yesterday and then vomited at work so she was sent home.  She said that she still has some intermittent nausea and vomiting.  She says that she has been taking her medications.  She denies fever, chest pain, shortness of breath.  She has been sleeping comfortably in the exam room since being placed in the room.  She says she feels okay right now.     Physical Exam   Triage Vital Signs: ED Triage Vitals  Enc Vitals Group     BP 11/03/22 2152 (!) 136/98     Pulse Rate 11/03/22 2152 98     Resp 11/03/22 2152 16     Temp 11/03/22 2152 98.2 F (36.8 C)     Temp Source 11/03/22 2152 Oral     SpO2 11/03/22 2152 100 %     Weight 11/03/22 2150 74.8 kg (165 lb)     Height 11/03/22 2150 1.6 m (5\' 3" )     Head Circumference --      Peak Flow --      Pain Score 11/03/22 2150 7     Pain Loc --      Pain Edu? --      Excl. in [place]? --     Most recent vital signs: Vitals:   11/03/22 2152 11/04/22 0105  BP: (!) 136/98 (!) 132/96  Pulse: 98 90  Resp: 16 16  Temp: 98.2 F (36.8 C) 98.4 F (36.9 C)  SpO2: 100% 100%     General: Awake, no distress.  CV:  Good peripheral perfusion.  Regular rate and rhythm. Resp:  Normal effort.  No accessory muscle usage.  Breathing easily comfortably, no wheezing. Abd:  No distention.  Soft with no tenderness to palpation.  No apparent pain or tenderness when she moves around and readjust her position in bed. Other:  Patient is sleepy but awakens easily to voice and  light touch.  No acute distress.  Acting appropriate under the circumstances.   ED Results / Procedures / Treatments   Labs (all labs ordered are listed, but only abnormal results are displayed) Labs Reviewed  COMPREHENSIVE METABOLIC PANEL - Abnormal; Notable for the following components:      Result Value   Glucose, Bld 280 (*)    All other components within normal limits  CBG MONITORING, ED - Abnormal; Notable for the following components:   Glucose-Capillary 285 (*)    All other components within normal limits  RESP PANEL BY RT-PCR (RSV, FLU A&B, COVID)  RVPGX2  CBC WITH DIFFERENTIAL/PLATELET     PROCEDURES:  Critical Care performed: No  Procedures   MEDICATIONS ORDERED IN ED: Medications - No data to display   IMPRESSION / MDM / [place] / ED COURSE  I reviewed the triage vital signs and the nursing notes.  Differential diagnosis includes, but is not limited to, viral illness, acute intra-abdominal bacterial infection, SBO/ileus, medication or drug side effect.  Patient's presentation is most consistent with acute presentation with potential threat to life or bodily function.  Lab/studies ordered in triage: Respiratory viral panel, CBC with differential, CMP, CBG.  Vital signs are stable and within normal limits.  Labs are all reassuring.  No evidence of DKA, only relatively mildly elevated glucose, negative viral testing, no leukocytosis.  No urinary symptoms.  No tenderness to palpation.  I had my usual and customary viral gastroenteritis discussion with the patient including outpatient management recommendations.  I am writing her a different prescription for antiemetics as documented below.  I encouraged outpatient follow-up with her PCP and I gave my usual and customary return precautions.      FINAL CLINICAL IMPRESSION(S) / ED DIAGNOSES   Final diagnoses:  Nausea vomiting and diarrhea     Rx / DC Orders   ED  Discharge Orders          Ordered    promethazine (PHENERGAN) 25 MG tablet  Every 6 hours PRN        11/04/22 0309             Note:  This document was prepared using Dragon voice recognition software and may include unintentional dictation errors.   Hinda Kehr, MD 11/04/22 [phone]

## 2022-11-04 NOTE — ED Notes (Signed)
Patient verbalizes understanding of discharge instructions. Opportunity for questioning and answers were provided. Armband removed by staff, pt discharged from ED. Ambulated out to lobby  

## 2022-11-15 ENCOUNTER — Ambulatory Visit: Payer: Medicaid Other | Admitting: Neurosurgery

## 2022-11-28 ENCOUNTER — Emergency Department (HOSPITAL_COMMUNITY)
Admission: EM | Admit: 2022-11-28 | Discharge: 2022-11-29 | Disposition: A | Discharge status: Home / Self Care | Payer: No Typology Code available for payment source | Attending: Emergency Medicine | Admitting: Emergency Medicine

## 2022-11-28 ENCOUNTER — Other Ambulatory Visit: Payer: Self-pay

## 2022-11-28 DIAGNOSIS — R42 Dizziness and giddiness: Secondary | ICD-10-CM

## 2022-11-28 DIAGNOSIS — Z794 Long term (current) use of insulin: Secondary | ICD-10-CM | POA: Diagnosis not present

## 2022-11-28 DIAGNOSIS — Z79899 Other long term (current) drug therapy: Secondary | ICD-10-CM | POA: Diagnosis not present

## 2022-11-28 DIAGNOSIS — R112 Nausea with vomiting, unspecified: Secondary | ICD-10-CM | POA: Insufficient documentation

## 2022-11-28 DIAGNOSIS — E1165 Type 2 diabetes mellitus with hyperglycemia: Secondary | ICD-10-CM | POA: Diagnosis not present

## 2022-11-28 DIAGNOSIS — R197 Diarrhea, unspecified: Secondary | ICD-10-CM | POA: Insufficient documentation

## 2022-11-28 NOTE — ED Triage Notes (Signed)
Patient arrived with EMS from home reports elevated blood sugar today 400+ , she took her insulin , CBG= 191 by EMS , intermittent emesis and diarrhea for several weeks .

## 2022-11-28 NOTE — ED Provider Triage Note (Signed)
Emergency Medicine Provider Triage Evaluation Note  Jody Chavez , a 42 y.o. female  was evaluated in triage.  Pt complains of nausea, vomiting, and diarrhea. She states that same has been ongoing for the past few days. States that she has not been able to eat anything over the last few days as well.  Also states that earlier today her sugar was 400, however states she took her home insulin and it came back into normal range. Denies abdominal pain. Endorses some congestion without fevers.  Review of Systems  Positive:  Negative:   Physical Exam  BP 114/77   Pulse 89   Temp 98.7 F (37.1 C) (Oral)   Resp 16   LMP 10/20/2022 (Approximate)   SpO2 100%  Gen:   Awake, no distress   Resp:  Normal effort  MSK:   Moves extremities without difficulty  Other:    Medical Decision Making  Medically screening exam initiated at 11:48 PM.  Appropriate orders placed.  Collyn Selk was informed that the remainder of the evaluation will be completed by another provider, this initial triage assessment does not replace that evaluation, and the importance of remaining in the ED until their evaluation is complete.     Bud Face, PA-C 11/28/22 2351

## 2022-11-29 DIAGNOSIS — E1165 Type 2 diabetes mellitus with hyperglycemia: Secondary | ICD-10-CM | POA: Diagnosis not present

## 2022-11-29 LAB — COMPREHENSIVE METABOLIC PANEL
ALT: 22 U/L (ref 0–44)
AST: 28 U/L (ref 15–41)
Albumin: 3.9 g/dL (ref 3.5–5.0)
Alkaline Phosphatase: 66 U/L (ref 38–126)
Anion gap: 10 (ref 5–15)
BUN: 15 mg/dL (ref 6–20)
CO2: 25 mmol/L (ref 22–32)
Calcium: 9.4 mg/dL (ref 8.9–10.3)
Chloride: 98 mmol/L (ref 98–111)
Creatinine, Ser: 0.97 mg/dL (ref 0.44–1.00)
GFR, Estimated: >60 mL/min (ref >60)
Glucose, Bld: 178 mg/dL — ABNORMAL HIGH (ref 70–99)
Potassium: 3.8 mmol/L (ref 3.5–5.1)
Sodium: 133 mmol/L — ABNORMAL LOW (ref 135–145)
Total Bilirubin: 0.7 mg/dL (ref 0.3–1.2)
Total Protein: 7.1 g/dL (ref 6.5–8.1)

## 2022-11-29 LAB — URINALYSIS, ROUTINE W REFLEX MICROSCOPIC
Bilirubin Urine: NEGATIVE
Glucose, UA: 150 mg/dL — AB
Hgb urine dipstick: NEGATIVE
Ketones, ur: 5 mg/dL — AB
Nitrite: NEGATIVE
Protein, ur: 100 mg/dL — AB
Specific Gravity, Urine: 1.029 (ref 1.005–1.030)
pH: 5 (ref 5.0–8.0)

## 2022-11-29 LAB — CBC WITH DIFFERENTIAL/PLATELET
Abs Immature Granulocytes: 0.04 10*3/uL (ref 0.00–0.07)
Basophils Absolute: 0.1 10*3/uL (ref 0.0–0.1)
Basophils Relative: 1 %
Eosinophils Absolute: 0.1 10*3/uL (ref 0.0–0.5)
Eosinophils Relative: 1 %
HCT: 39.7 % (ref 36.0–46.0)
Hemoglobin: 13 g/dL (ref 12.0–15.0)
Immature Granulocytes: 1 %
Lymphocytes Relative: 19 %
Lymphs Abs: 1.7 10*3/uL (ref 0.7–4.0)
MCH: 29 pg (ref 26.0–34.0)
MCHC: 32.7 g/dL (ref 30.0–36.0)
MCV: 88.6 fL (ref 80.0–100.0)
Monocytes Absolute: 0.7 10*3/uL (ref 0.1–1.0)
Monocytes Relative: 7 %
Neutro Abs: 6.3 10*3/uL (ref 1.7–7.7)
Neutrophils Relative %: 71 %
Platelets: 321 10*3/uL (ref 150–400)
RBC: 4.48 MIL/uL (ref 3.87–5.11)
RDW: 14.3 % (ref 11.5–15.5)
WBC: 8.8 10*3/uL (ref 4.0–10.5)
nRBC: 0 % (ref 0.0–0.2)

## 2022-11-29 LAB — I-STAT BETA HCG BLOOD, ED (MC, WL, AP ONLY): I-stat hCG, quantitative: 18.8 m[IU]/mL — ABNORMAL HIGH (ref <5)

## 2022-11-29 LAB — BETA-HYDROXYBUTYRIC ACID: Beta-Hydroxybutyric Acid: 0.37 mmol/L — ABNORMAL HIGH (ref 0.05–0.27)

## 2022-11-29 LAB — LIPASE, BLOOD: Lipase: 37 U/L (ref 11–51)

## 2022-11-29 MED ORDER — PROMETHAZINE HCL 25 MG RE SUPP: 25.0000 mg | Freq: Four times a day (QID) | RECTAL | 0 refills | Status: AC | PRN

## 2022-11-29 NOTE — ED Notes (Signed)
Pt refused Covid Swab

## 2022-11-29 NOTE — Discharge Instructions (Signed)
Use imodium as needed for diarrhea.  Follow up with your doctor in the office.  Eat and drink as well as you can for the next couple of days.

## 2022-11-29 NOTE — ED Provider Notes (Signed)
[facility] Provider Note   CSN: [id number] Arrival date & time: 11/28/22  2333     History  Chief Complaint  Patient presents with   Hyperglycemia    Jody Chavez is a 42 y.o. female.  42 yo F with a chief complaints of high blood sugar.  This was noted just on 1 blood sugar check.  She took her sliding scale insulin and improved.  She tells me she is actually here because she has not felt well and she has been having nausea vomiting and diarrhea off and on.  Had it a few weeks ago and then seem to recur a few days ago.  She is feeling a bit lightheaded when she stands up and tries to walk around.  Has seen her endocrinologist recently who told her he thought she should not be on hydralazine.  She has been struggling with frequent urination and is scheduled to follow-up with urologist in the office.   Hyperglycemia      Home Medications Prior to Admission medications   Medication Sig Start Date End Date Taking? Authorizing Provider  promethazine (PHENERGAN) 25 MG suppository Place 1 suppository (25 mg total) rectally every 6 (six) hours as needed for nausea or vomiting. 11/29/22  Yes Deno Etienne, DO  amLODipine (NORVASC) 10 MG tablet  08/10/20   [provider]  atorvastatin (LIPITOR) 20 MG tablet  04/28/21   [provider]  escitalopram (LEXAPRO) 20 MG tablet Take 20 mg by mouth daily.    [provider]  gabapentin (NEURONTIN) 300 MG capsule Take 1 capsule (300 mg total) by mouth 3 (three) times daily. 07/10/20   Coral Spikes, DO  ibuprofen (ADVIL) 800 MG tablet Take 1 tablet (800 mg total) by mouth every 8 (eight) hours as needed. 08/12/22   Ward, Delice Bison, DO  Insulin Aspart (NOVOLOG FLEXPEN [place]) Inject into the skin.    [provider]  insulin detemir (LEVEMIR) 100 UNIT/ML injection Inject 36 Units into the skin daily.    [provider]  insulin lispro (HUMALOG KWIKPEN) 100 UNIT/ML  KwikPen  08/10/20   [provider]  Insulin Syringe-Needle U-100 (INSULIN SYRINGE .3CC/31GX5/16") 31G X 5/16" 0.3 ML MISC 5 times daily as directed. 11/04/16   Henreitta Leber, MD  LINZESS 145 MCG CAPS capsule Take 145 mcg by mouth every morning.    [provider]  lisinopril (PRINIVIL,ZESTRIL) 40 MG tablet Take 1 tablet (40 mg total) by mouth daily. 11/02/18   Dustin Flock, MD  [facility] 17 GM/SCOOP powder Take 17 g by mouth daily. 03/05/21   [provider]  nortriptyline (PAMELOR) 10 MG capsule Take 10 mg by mouth daily. 08/29/22   [provider]  ondansetron (ZOFRAN-ODT) 8 MG disintegrating tablet Take 1 tablet (8 mg total) by mouth every 8 (eight) hours as needed for nausea or vomiting. 10/30/22   Arta Silence, MD  pantoprazole (PROTONIX) 20 MG tablet Take 1 tablet (20 mg total) by mouth daily. 11/02/20 09/01/22  Lavonia Drafts, MD  promethazine (PHENERGAN) 25 MG tablet Take 1 tablet (25 mg total) by mouth every 6 (six) hours as needed for nausea or vomiting. 11/04/22   Hinda Kehr, MD  pseudoephedrine (SUDAFED) 60 MG tablet Take 1 tablet (60 mg total) by mouth every 6 (six) hours as needed for congestion. 10/30/22   Arta Silence, MD  SENNA PLUS 50-8.6 MG CAPS Take 2 capsules by mouth at bedtime as needed. 03/05/21  [provider]      Allergies    Iodine, Shellfish allergy, Penicillins, and Penicillins    Review of Systems   Review of Systems  Physical Exam Updated Vital Signs BP 118/77 (BP Location: Right Arm)   Pulse 62   Temp 98 F (36.7 C) (Oral)   Resp 16   LMP 10/20/2022 (Approximate)   SpO2 97%  Physical Exam Vitals and nursing note reviewed.  Constitutional:      General: She is not in acute distress.    Appearance: She is well-developed. She is not diaphoretic.  HENT:     Head: Normocephalic and atraumatic.  Eyes:     Pupils: Pupils are equal, round, and reactive to light.  Cardiovascular:     Rate and Rhythm:  Normal rate and regular rhythm.     Heart sounds: No murmur heard.    No friction rub. No gallop.  Pulmonary:     Effort: Pulmonary effort is normal.     Breath sounds: No wheezing or rales.  Abdominal:     General: There is no distension.     Palpations: Abdomen is soft.     Tenderness: There is no abdominal tenderness.  Musculoskeletal:        General: No tenderness.     Cervical back: Normal range of motion and neck supple.  Skin:    General: Skin is warm and dry.  Neurological:     Mental Status: She is alert and oriented to person, place, and time.  Psychiatric:        Behavior: Behavior normal.     ED Results / Procedures / Treatments   Labs (all labs ordered are listed, but only abnormal results are displayed) Labs Reviewed  COMPREHENSIVE METABOLIC PANEL - Abnormal; Notable for the following components:      Result Value   Sodium 133 (*)    Glucose, Bld 178 (*)    All other components within normal limits  URINALYSIS, ROUTINE W REFLEX MICROSCOPIC - Abnormal; Notable for the following components:   Color, Urine AMBER (*)    APPearance CLOUDY (*)    Glucose, UA 150 (*)    Ketones, ur 5 (*)    Protein, ur 100 (*)    Leukocytes,Ua SMALL (*)    Bacteria, UA FEW (*)    All other components within normal limits  BETA-HYDROXYBUTYRIC ACID - Abnormal; Notable for the following components:   Beta-Hydroxybutyric Acid 0.37 (*)    All other components within normal limits  I-STAT BETA HCG BLOOD, ED ([facility], [facility], [facility] ONLY) - Abnormal; Notable for the following components:   I-stat hCG, quantitative 18.8 (*)    All other components within normal limits  RESP PANEL BY RT-PCR (RSV, FLU A&B, COVID)  RVPGX2  LIPASE, BLOOD  CBC WITH DIFFERENTIAL/PLATELET  CBG MONITORING, ED    EKG None  Radiology No results found.  Procedures Procedures    Medications Ordered in ED Medications - No data to display  ED Course/ Medical Decision Making/ A&P                              Medical Decision Making Risk Prescription drug management.   42 yo F who is chief complaint listed as hyperglycemia tells me she is actually here for nausea vomiting and diarrhea.  She has benign abdominal exam.  Laboratory evaluation was obtained without significant electrolyte abnormality.  Mild ketosis.  No acidosis no anion  gap.  UA negative for infection is independently interpreted by me.  Likely due to dehydration with her symptoms.  She has been able to eat and drink since she has been here.  She tells me she has had some nausea at home despite being on Phenergan and Zofran.  Will prescribe a suppository.  Encouraged her to take Imodium.  Encouraged her to follow-up as an outpatient.  9:36 AM:  I have discussed the diagnosis/risks/treatment options with the patient.  Evaluation and diagnostic testing in the emergency department does not suggest an emergent condition requiring admission or immediate intervention beyond what has been performed at this time.  They will follow up with PCP. We also discussed returning to the ED immediately if new or worsening sx occur. We discussed the sx which are most concerning (e.g., sudden worsening pain, fever, inability to tolerate by mouth) that necessitate immediate return. Medications administered to the patient during their visit and any new prescriptions provided to the patient are listed below.  Medications given during this visit Medications - No data to display   The patient appears reasonably screen and/or stabilized for discharge and I doubt any other medical condition or other [facility] requiring further screening, evaluation, or treatment in the ED at this time prior to discharge.          Final Clinical Impression(s) / ED Diagnoses Final diagnoses:  Nausea vomiting and diarrhea  Lightheaded    Rx / DC Orders ED Discharge Orders          Ordered    promethazine (PHENERGAN) 25 MG suppository  Every 6 hours PRN        11/29/22 0911               Deno Etienne, DO 11/29/22 [phone]

## 2024-06-14 ENCOUNTER — Other Ambulatory Visit: Payer: Self-pay | Admitting: Medical Genetics

## 2024-06-21 ENCOUNTER — Other Ambulatory Visit
Admission: RE | Admit: 2024-06-21 | Discharge: 2024-06-21 | Disposition: A | Discharge status: Home / Self Care | Payer: Self-pay | Source: Ambulatory Visit | Attending: Medical Genetics | Admitting: Medical Genetics

## 2024-07-02 LAB — GENECONNECT MOLECULAR SCREEN: Genetic Analysis Overall Interpretation: NEGATIVE
